# Patient Record
Sex: Male | Born: 1995
Health system: Southern US, Community
[De-identification: ages and names within clinical notes are randomized; demographics above are authoritative.]

## PROBLEM LIST (undated history)

## (undated) DIAGNOSIS — F329 Major depressive disorder, single episode, unspecified: Secondary | ICD-10-CM

## (undated) DIAGNOSIS — E079 Disorder of thyroid, unspecified: Secondary | ICD-10-CM

## (undated) DIAGNOSIS — R011 Cardiac murmur, unspecified: Secondary | ICD-10-CM

## (undated) DIAGNOSIS — F32A Depression, unspecified: Secondary | ICD-10-CM

---
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
## 2016-12-06 IMAGING — CT CT ABDOMEN W/ CM
2 of 4 series · 17 of 46 positions shown, 19 images · IV contrast ([ID] [id number])
Comparison: 05/19/2006

CLINICAL DATA: Mid abdominal pain since [REDACTED]. Elevated
lipase last week.

EXAM:
CT ABDOMEN WITH CONTRAST
TECHNIQUE: Multidetector CT imaging of the abdomen was performed using the
standard protocol following bolus administration of intravenous
contrast.
CONTRAST:  125mL 2TCL9X-211 IOPAMIDOL (2TCL9X-211) INJECTION 61%

[Series 3: abd/pelvis with · axial · 0.70mm/px · z∈[-258,-2]mm · 14 of 57 slices shown, 16 images]
[im 3/57  soft-tissue]
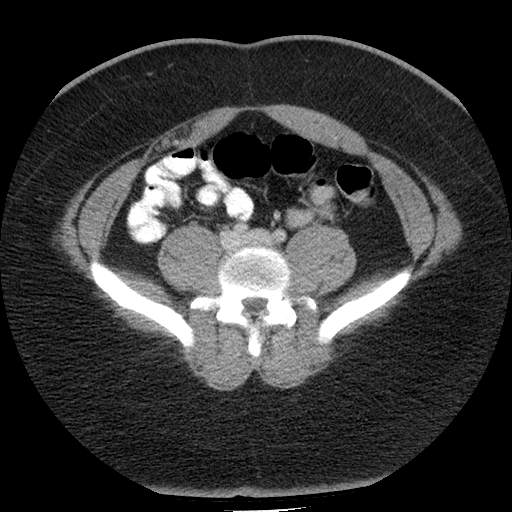
[im 3/57  bone]
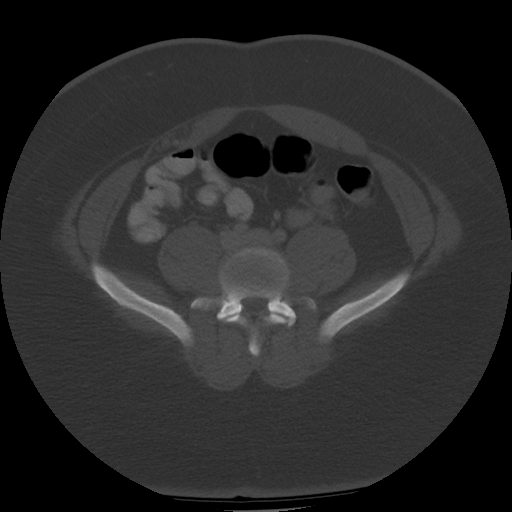
[im 8/57  soft-tissue]
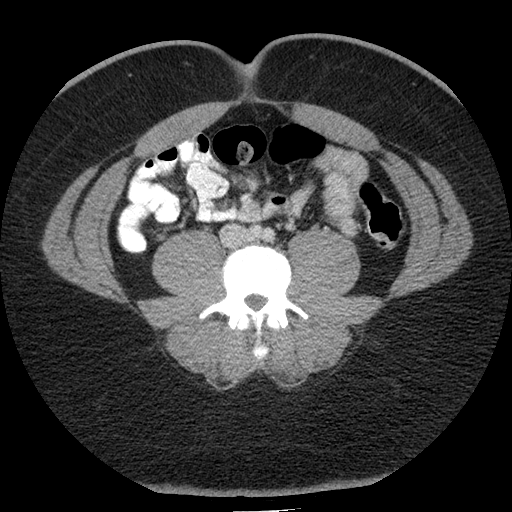
[im 11/57  soft-tissue]
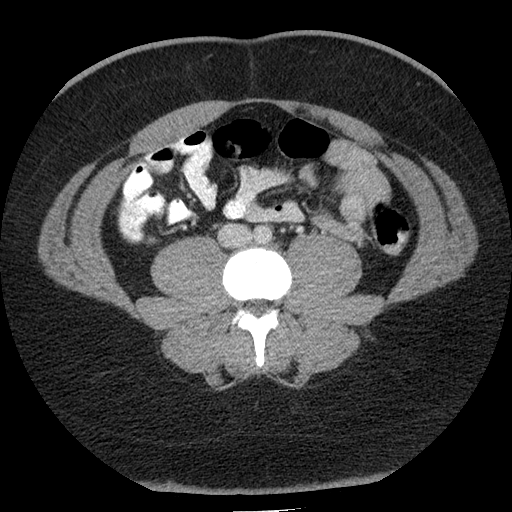
[im 16/57  soft-tissue]
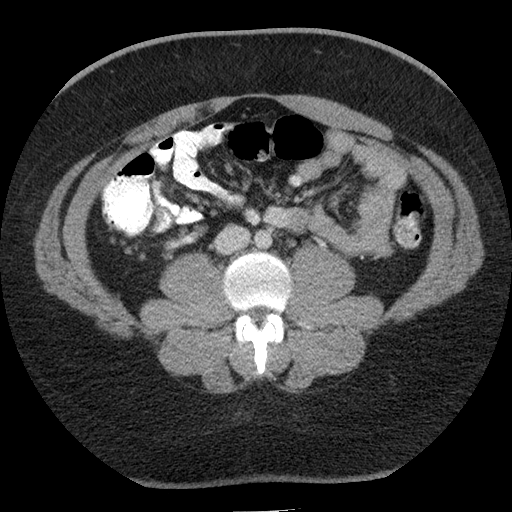
[im 18/57  soft-tissue]
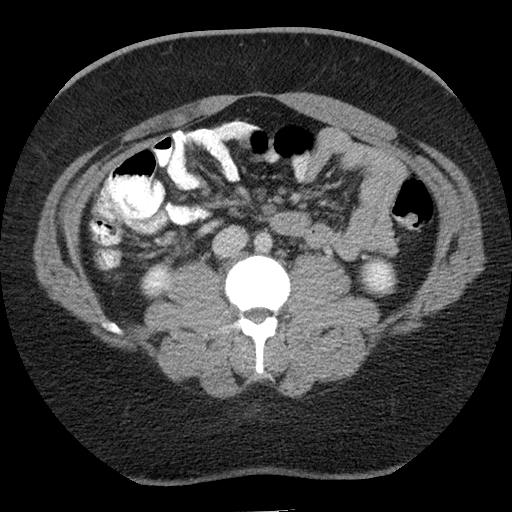
[im 23/57  soft-tissue]
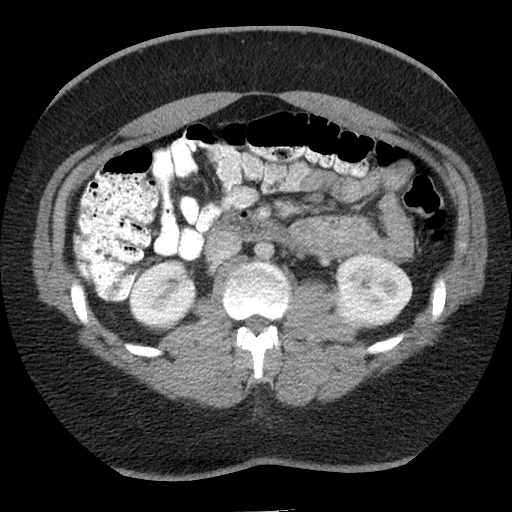
[im 26/57  soft-tissue]
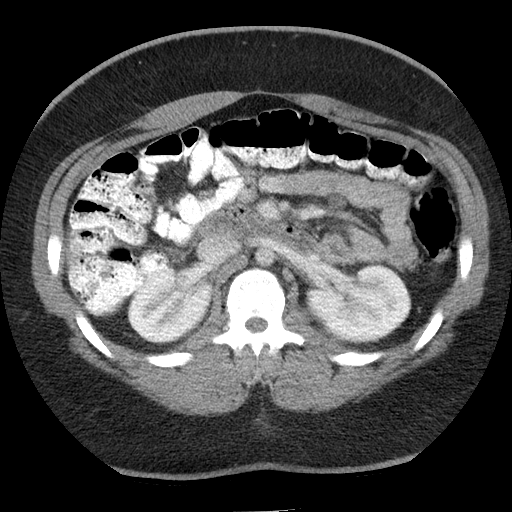
[im 31/57  soft-tissue]
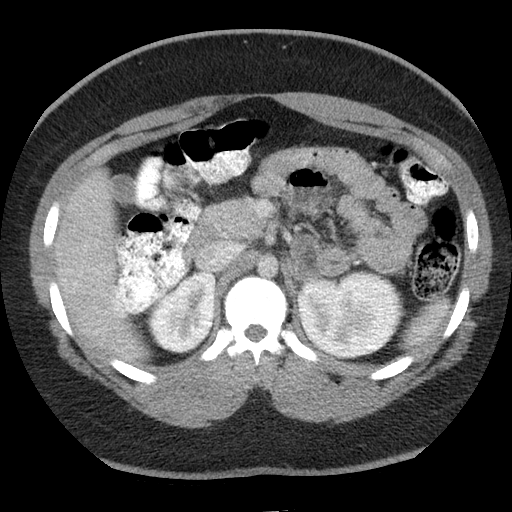
[im 34/57  soft-tissue]
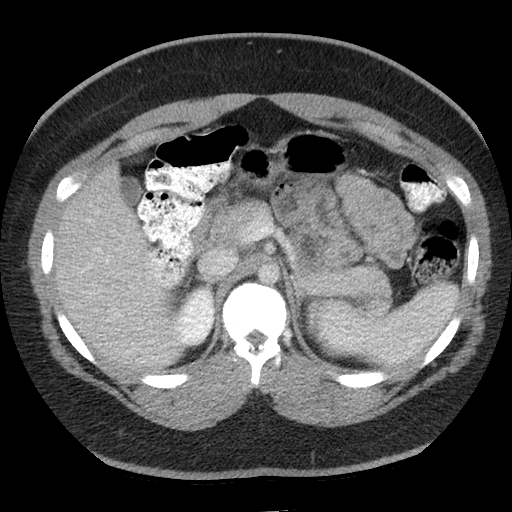
[im 34/57  bone]
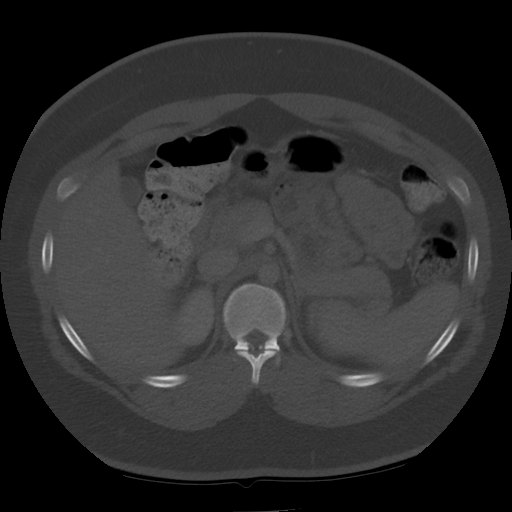
[im 39/57  soft-tissue]
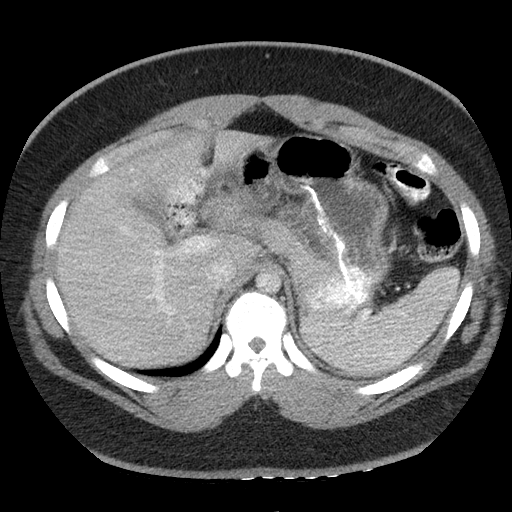
[im 41/57  soft-tissue]
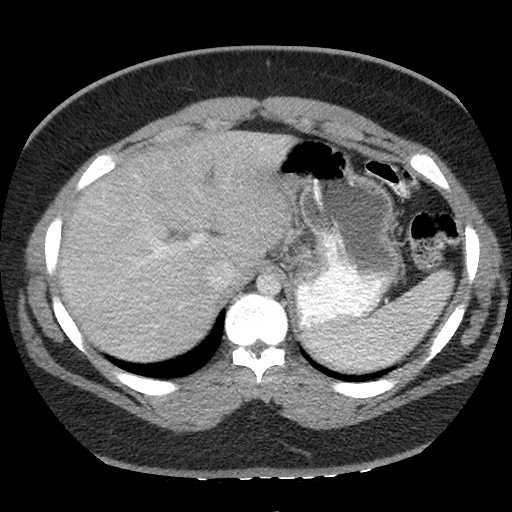
[im 46/57  soft-tissue]
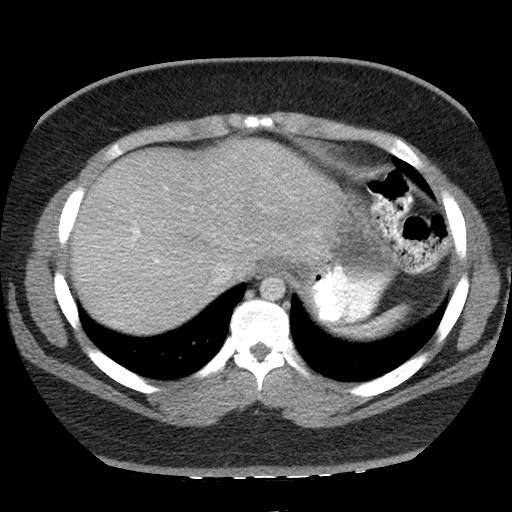
[im 49/57  soft-tissue]
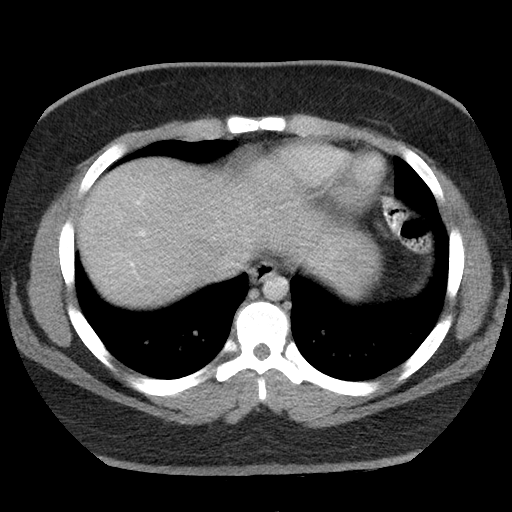
[im 54/57  soft-tissue]
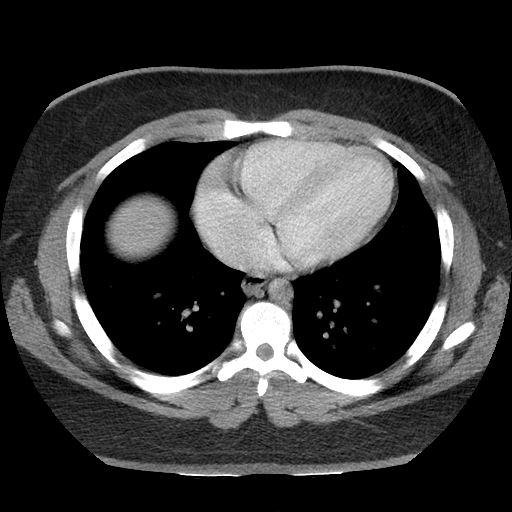

[Series 200: coronal · coronal · 0.70mm/px · 3 of 168 slices shown]
[im 56/168  soft-tissue]
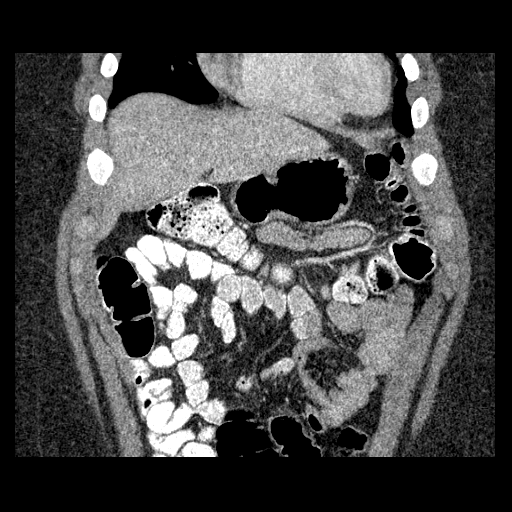
[im 75/168  soft-tissue]
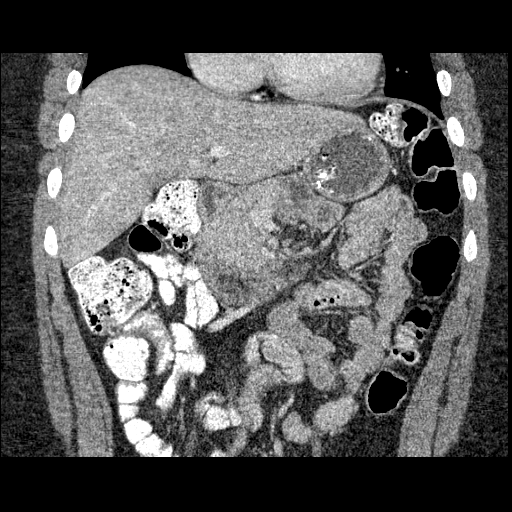
[im 93/168  soft-tissue]
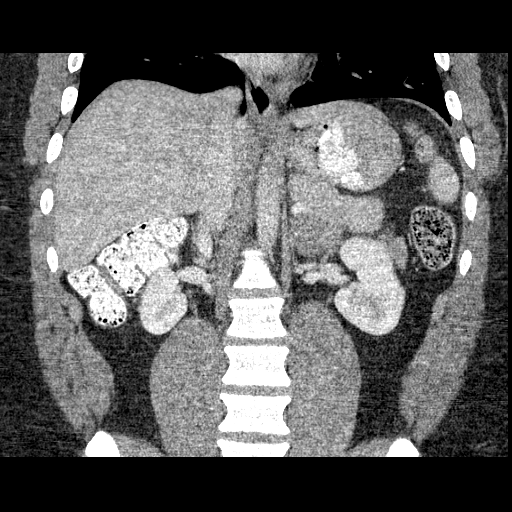

[17 of 46 positions shown; findings below may reference images not displayed]

FINDINGS: Lower chest: The lung bases are clear. No pleural effusion or
pulmonary nodules. The heart is normal in size. No pericardial
effusion. The distal esophagus is grossly normal.

Hepatobiliary: No focal hepatic lesions or intrahepatic biliary
dilatation. The gallbladder is normal. No common bile duct
dilatation.

Pancreas: No mass, inflammation or ductal dilatation. Examination is
somewhat limited by respiratory motion.

Spleen: Normal size.  No focal lesions.

Adrenals/Urinary Tract: The adrenal glands and kidneys are normal.

Stomach/Bowel: The stomach, duodenum, visualized small bowel and
visualized colon are normal. No inflammatory changes or mass
lesions. The appendix is normal.

Vascular/Lymphatic: There are numerous small scattered mesenteric
lymph nodes which are unchanged. No retroperitoneal adenopathy or
mass. The aorta and branch vessels are normal. The major venous
structures are patent.

Other: No ascites, abdominal wall hernia or subcutaneous lesions.

Musculoskeletal: No significant bony findings.
IMPRESSION: No acute abdominal findings, mass lesions are adenopathy.

Stable numerous scattered mesenteric lymph nodes since [DATE].

## 2019-07-16 DIAGNOSIS — F4310 Post-traumatic stress disorder, unspecified: Secondary | ICD-10-CM | POA: Diagnosis not present

## 2019-07-16 DIAGNOSIS — Z20822 Contact with and (suspected) exposure to covid-19: Secondary | ICD-10-CM | POA: Diagnosis not present

## 2019-07-16 DIAGNOSIS — F1210 Cannabis abuse, uncomplicated: Secondary | ICD-10-CM | POA: Diagnosis not present

## 2019-07-16 DIAGNOSIS — F312 Bipolar disorder, current episode manic severe with psychotic features: Secondary | ICD-10-CM | POA: Diagnosis not present

## 2019-07-16 DIAGNOSIS — F1020 Alcohol dependence, uncomplicated: Secondary | ICD-10-CM | POA: Diagnosis not present

## 2019-09-10 ENCOUNTER — Ambulatory Visit (HOSPITAL_COMMUNITY)
Admission: EM | Admit: 2019-09-10 | Discharge: 2019-09-10 | Disposition: A | Discharge status: Home / Self Care | Payer: Managed Care, Other (non HMO) | Attending: Family Medicine | Admitting: Family Medicine

## 2019-09-10 ENCOUNTER — Other Ambulatory Visit: Payer: Self-pay

## 2019-09-10 ENCOUNTER — Encounter (HOSPITAL_COMMUNITY): Payer: Self-pay

## 2019-09-10 DIAGNOSIS — F329 Major depressive disorder, single episode, unspecified: Secondary | ICD-10-CM

## 2019-09-10 DIAGNOSIS — F32A Depression, unspecified: Secondary | ICD-10-CM

## 2019-09-10 HISTORY — DX: Depression, unspecified: F32.A

## 2019-09-10 MED ORDER — FLUOXETINE HCL 10 MG PO TABS: 10.0000 mg | ORAL_TABLET | Freq: Every day | ORAL | 1 refills | Status: DC

## 2019-09-10 NOTE — ED Triage Notes (Signed)
Pt states he "got depressed out of now where yesterday." Pt states he is on abilify, but doesn't believe it's helping that much anymore. He believes the dosage needs to be raised or needs to be started on another med. Pt denies SI/HI at present, but states he does feel suicidal when he gets depressed. Pt states he feels fine today.

## 2019-09-11 NOTE — ED Provider Notes (Signed)
[place]   [id number] 09/10/19 Arrival Time: 7564  ASSESSMENT & PLAN:  1. Depression, unspecified depression type     Encouraged exercise and good sleep habits.  Begin: Meds ordered this encounter  Medications  . FLUoxetine (PROZAC) 10 MG tablet    Sig: Take 1 tablet (10 mg total) by mouth daily.    Dispense:  30 tablet    Refill:  1    Recommend: Follow-up Information    [place].   Specialty: Urgent Care Why: If worsening or failing to improve as anticipated. Contact information: [place] [phone]       Schedule an appointment as soon as possible for a visit  with [facility]   Why: Since you mentioned that you need your vision checked. Contact information: [address] [place] [address] [phone]           Reviewed expectations re: course of current medical issues. Questions answered. Outlined signs and symptoms indicating need for more acute intervention. Patient verbalized understanding. After Visit Summary given.   SUBJECTIVE:  NISHANT SCHRECENGOST is a 24 y.o. male who reports long h/o on/off depression. Was placed on Abilify at some point; continues taking. Yesterday feeling depressed. Feels like he may need to start a depression medicine. No SI/HI at this time. Has felt SI in the distant past. No anxiety or stress reported.  Also feels like he needs his vision checked.  Social History   Tobacco Use  Smoking Status Current Every Day Smoker  . Packs/day: 1.00  . Types: Cigarettes   Social History   Substance and Sexual Activity  Alcohol Use Yes   Comment: social   No illicit drug use reported.  ROS: As per HPI. All other systems negative.   OBJECTIVE:  Vitals:   09/10/19 1518  BP: 105/73  Pulse: 81  Resp: 18  Temp: 98.6 F (37 C)  TempSrc: Oral  SpO2: 98%  Weight: 127 kg  Height: 5\' 5"  (1.651 m)      General appearance: alert; appears in NAD Eyes: PERRLA; EOMI; conjunctiva normal HENT: normocephalic; atraumatic Neck: supple Extremities: no edema; symmetrical with no gross deformities Skin: warm and dry Neurologic: normal gait; normal symmetric reflexes Psychological: alert and cooperative; appropriate mood; normal affect   No Known Allergies  Past Medical History:  Diagnosis Date  . Depression   . Heart murmur    Social History   Socioeconomic History  . Marital status: Single    Spouse name: Not on file  . Number of children: Not on file  . Years of education: Not on file  . Highest education level: Not on file  Occupational History  . Not on file  Tobacco Use  . Smoking status: Current Every Day Smoker    Packs/day: 1.00    Types: Cigarettes  Substance and Sexual Activity  . Alcohol use: Yes    Comment: social  . Drug use: Not Currently    Types: Marijuana  . Sexual activity: Not on file  Other Topics Concern  . Not on file  Social History Narrative  . Not on file   Social Determinants of Health   Financial Resource Strain:   . Difficulty of Paying Living Expenses:   Food Insecurity:   . Worried About Charity fundraiser in the Last Year:   . Arboriculturist in the Last Year:   [employer]  Needs:   . Lack of Transportation (Medical):   Marland Kitchen Lack of Transportation (Non-Medical):   Physical Activity:   . Days of Exercise per Week:   . Minutes of Exercise per Session:   Stress:   . Feeling of Stress :   Social Connections:   . Frequency of Communication with Friends and Family:   . Frequency of Social Gatherings with Friends and Family:   . Attends Religious Services:   . Active Member of Clubs or Organizations:   . Attends Banker Meetings:   Marland Kitchen Marital Status:   Intimate Partner Violence:   . Fear of Current or Ex-Partner:   . Emotionally Abused:   Marland Kitchen Physically Abused:   . Sexually Abused:    Family History  Problem Relation  Age of Onset  . Crohn's disease Mother   . Asthma Father    Past Surgical History:  Procedure Laterality Date  . ESOPHAGOGASTRODUODENOSCOPY (EGD) WITH PROPOFOL N/A 05/12/2015   Procedure: ESOPHAGOGASTRODUODENOSCOPY (EGD) WITH PROPOFOL;  Surgeon: Willis Modena, MD;  Location: [facility];  Service: Endoscopy;  Laterality: N/A;  . EUS N/A 05/12/2015   Procedure: UPPER ENDOSCOPIC ULTRASOUND (EUS) RADIAL;  Surgeon: Willis Modena, MD;  Location: [facility];  Service: Endoscopy;  Laterality: N/A;      Mardella Layman, MD 09/11/19 1004

## 2020-06-02 DIAGNOSIS — F331 Major depressive disorder, recurrent, moderate: Secondary | ICD-10-CM | POA: Diagnosis not present

## 2020-06-07 ENCOUNTER — Other Ambulatory Visit: Payer: Self-pay

## 2020-06-07 ENCOUNTER — Ambulatory Visit (HOSPITAL_COMMUNITY)
Admission: EM | Admit: 2020-06-07 | Discharge: 2020-06-07 | Disposition: A | Discharge status: Home / Self Care | Payer: BC Managed Care – PPO | Attending: Family Medicine | Admitting: Family Medicine

## 2020-06-07 ENCOUNTER — Encounter (HOSPITAL_COMMUNITY): Payer: Self-pay | Admitting: Emergency Medicine

## 2020-06-07 DIAGNOSIS — G479 Sleep disorder, unspecified: Secondary | ICD-10-CM

## 2020-06-07 DIAGNOSIS — F3340 Major depressive disorder, recurrent, in remission, unspecified: Secondary | ICD-10-CM

## 2020-06-07 DIAGNOSIS — E039 Hypothyroidism, unspecified: Secondary | ICD-10-CM

## 2020-06-07 HISTORY — DX: Disorder of thyroid, unspecified: E07.9

## 2020-06-07 MED ORDER — MIRTAZAPINE 15 MG PO TABS: 15.0000 mg | ORAL_TABLET | Freq: Every day | ORAL | 0 refills | Status: DC

## 2020-06-07 MED ORDER — QUETIAPINE FUMARATE 25 MG PO TABS: 25.0000 mg | ORAL_TABLET | Freq: Every day | ORAL | 0 refills | Status: DC

## 2020-06-07 MED ORDER — QUETIAPINE FUMARATE ER 150 MG PO TB24: 150.0000 mg | ORAL_TABLET | Freq: Every day | ORAL | 0 refills | Status: DC

## 2020-06-07 MED ORDER — SERTRALINE HCL 100 MG PO TABS: 100.0000 mg | ORAL_TABLET | Freq: Two times a day (BID) | ORAL | 0 refills | Status: DC

## 2020-06-07 MED ORDER — LEVOTHYROXINE SODIUM 25 MCG PO TABS: 25.0000 ug | ORAL_TABLET | Freq: Every day | ORAL | 0 refills | Status: DC

## 2020-06-07 NOTE — ED Triage Notes (Signed)
Pt presents for medication refills. States recently moved back to [place]. States has appt to establish with PCP on 01/06.

## 2020-06-07 NOTE — ED Provider Notes (Signed)
[facility]    CSN: [id number] Arrival date & time: 06/07/20  1727      History   Chief Complaint No chief complaint on file.   HPI Dominic Bryant is a 24 y.o. male.   HPI   Patient is here requesting refill of medications.  He brings with him his most recent pharmacy records.  He also brings the discharge summary from a program in [place].  He was treated there for major depression, anxiety, alcohol abuse, cannabis abuse.  He states that he has done well in his program.  He is back in [place] with his family.  He is looking for a job.  He is doing well with his sobriety. He is trying to lose weight and exercise in order to improve his health His medications are reviewed with him.  He states that he is stable on these medications. He states that his major diagnosis is depression.  In addition he has difficulty sleeping.  He states the Seroquel and Remeron were added to help him sleep.  Past Medical History:  Diagnosis Date  . Depression   . Heart murmur   . Thyroid disease     There are no problems to display for this patient.   Past Surgical History:  Procedure Laterality Date  . ESOPHAGOGASTRODUODENOSCOPY (EGD) WITH PROPOFOL N/A 05/12/2015   Procedure: ESOPHAGOGASTRODUODENOSCOPY (EGD) WITH PROPOFOL;  Surgeon: Willis Modena, MD;  Location: [facility];  Service: Endoscopy;  Laterality: N/A;  . EUS N/A 05/12/2015   Procedure: UPPER ENDOSCOPIC ULTRASOUND (EUS) RADIAL;  Surgeon: Willis Modena, MD;  Location: [facility];  Service: Endoscopy;  Laterality: N/A;       Home Medications    Prior to Admission medications   Medication Sig Start Date End Date Taking? Authorizing Provider  levothyroxine (EUTHYROX) 25 MCG tablet Take 1 tablet (25 mcg total) by mouth daily before breakfast. 06/07/20   Eustace Moore, MD  mirtazapine (REMERON) 15 MG tablet Take 1 tablet (15 mg total) by mouth at bedtime. 06/07/20   Eustace Moore, MD  QUEtiapine  (SEROQUEL) 25 MG tablet Take 1 tablet (25 mg total) by mouth at bedtime. 06/07/20   Eustace Moore, MD  QUEtiapine Fumarate (SEROQUEL XR) 150 MG 24 hr tablet Take 1 tablet (150 mg total) by mouth at bedtime. 06/07/20   Eustace Moore, MD  sertraline (ZOLOFT) 100 MG tablet Take 1 tablet (100 mg total) by mouth 2 (two) times daily. 06/07/20   Eustace Moore, MD  ARIPiprazole (ABILIFY) 5 MG tablet Take 5 mg by mouth daily.  06/07/20  [provider]  FLUoxetine (PROZAC) 10 MG tablet Take 1 tablet (10 mg total) by mouth daily. 09/10/19 06/07/20  Mardella Layman, MD    Family History Family History  Problem Relation Age of Onset  . Crohn's disease Mother   . Asthma Father     Social History Social History   Tobacco Use  . Smoking status: Current Every Day Smoker    Packs/day: 1.00    Types: Cigarettes  . Smokeless tobacco: Never Used  Substance Use Topics  . Alcohol use: Yes    Comment: social  . Drug use: Not Currently    Types: Marijuana     Allergies   Patient has no known allergies.   Review of Systems Review of Systems  See HPI Physical Exam Triage Vital Signs ED Triage Vitals  Enc Vitals Group     BP 06/07/20 1842 138/80  Pulse Rate 06/07/20 1842 85     Resp 06/07/20 1842 17     Temp 06/07/20 1842 98.3 F (36.8 C)     Temp Source 06/07/20 1842 Oral     SpO2 06/07/20 1842 98 %     Weight --      Height --      Head Circumference --      Peak Flow --      Pain Score 06/07/20 1841 0     Pain Loc --      Pain Edu? --      Excl. in GC? --    No data found.  Updated Vital Signs BP 138/80 (BP Location: Right Arm)   Pulse 85   Temp 98.3 F (36.8 C) (Oral)   Resp 17   SpO2 98%      Physical Exam Constitutional:      General: He is not in acute distress.    Appearance: He is well-developed and well-nourished.     Comments: Pleasant.  Obese.  Articulate  HENT:     Head: Normocephalic and atraumatic.     Nose:     Comments: Mask  is in place    Mouth/Throat:     Mouth: Oropharynx is clear and moist.  Eyes:     Conjunctiva/sclera: Conjunctivae normal.     Pupils: Pupils are equal, round, and reactive to light.  Cardiovascular:     Rate and Rhythm: Normal rate and regular rhythm.     Heart sounds: Normal heart sounds.  Pulmonary:     Effort: Pulmonary effort is normal. No respiratory distress.     Breath sounds: Normal breath sounds.  Abdominal:     Palpations: Abdomen is soft.  Musculoskeletal:        General: No edema. Normal range of motion.     Cervical back: Normal range of motion.  Skin:    General: Skin is warm and dry.  Neurological:     Mental Status: He is alert.  Psychiatric:        Behavior: Behavior normal.      UC Treatments / Results  Labs (all labs ordered are listed, but only abnormal results are displayed) Labs Reviewed - No data to display  EKG   Radiology No results found.  Procedures Procedures (including critical care time)  Medications Ordered in UC Medications - No data to display  Initial Impression / Assessment and Plan / UC Course  I have reviewed the triage vital signs and the nursing notes.  Pertinent labs & imaging results that were available during my care of the patient were reviewed by me and considered in my medical decision making (see chart for details).     Limited record review Pharmacy review Medications are refilled for 30 days Appointment in January is confirmed Return here as needed Discussed the behavioral health urgent care in case he has mental health needs Final Clinical Impressions(s) / UC Diagnoses   Final diagnoses:  Recurrent major depressive disorder, in remission (HCC)  Hypothyroidism, unspecified type  Sleep disorder, unspecified     Discharge Instructions     All medicines refilled for 30 days See new primary care as scheduled    ED Prescriptions    Medication Sig Dispense Auth. Provider   mirtazapine (REMERON) 15 MG  tablet Take 1 tablet (15 mg total) by mouth at bedtime. 30 tablet Eustace Moore, MD   levothyroxine (EUTHYROX) 25 MCG tablet Take 1 tablet (25 mcg total) by  mouth daily before breakfast. 30 tablet Eustace Moore, MD   QUEtiapine (SEROQUEL) 25 MG tablet Take 1 tablet (25 mg total) by mouth at bedtime. 30 tablet Eustace Moore, MD   QUEtiapine Fumarate (SEROQUEL XR) 150 MG 24 hr tablet Take 1 tablet (150 mg total) by mouth at bedtime. 30 tablet Eustace Moore, MD   sertraline (ZOLOFT) 100 MG tablet Take 1 tablet (100 mg total) by mouth 2 (two) times daily. 60 tablet Eustace Moore, MD     PDMP not reviewed this encounter.   Eustace Moore, MD 06/07/20 [phone]

## 2020-06-07 NOTE — Discharge Instructions (Addendum)
All medicines refilled for 30 days See new primary care as scheduled

## 2020-07-01 ENCOUNTER — Ambulatory Visit (INDEPENDENT_AMBULATORY_CARE_PROVIDER_SITE_OTHER): Payer: BC Managed Care – PPO | Admitting: Internal Medicine

## 2020-07-01 ENCOUNTER — Other Ambulatory Visit: Payer: Self-pay

## 2020-07-01 ENCOUNTER — Encounter: Payer: Self-pay | Admitting: Internal Medicine

## 2020-07-01 VITALS — BP 124/82 | HR 79 | Temp 98.0°F | Ht 65.0 in | Wt 285.0 lb

## 2020-07-01 DIAGNOSIS — E039 Hypothyroidism, unspecified: Secondary | ICD-10-CM

## 2020-07-01 DIAGNOSIS — Z0001 Encounter for general adult medical examination with abnormal findings: Secondary | ICD-10-CM | POA: Diagnosis not present

## 2020-07-01 DIAGNOSIS — F324 Major depressive disorder, single episode, in partial remission: Secondary | ICD-10-CM | POA: Insufficient documentation

## 2020-07-01 DIAGNOSIS — K529 Noninfective gastroenteritis and colitis, unspecified: Secondary | ICD-10-CM | POA: Insufficient documentation

## 2020-07-01 LAB — CBC WITH DIFFERENTIAL/PLATELET
Basophils Absolute: 0.1 10*3/uL (ref 0.0–0.1)
Basophils Relative: 0.6 % (ref 0.0–3.0)
Eosinophils Absolute: 0.5 10*3/uL (ref 0.0–0.7)
Eosinophils Relative: 6.5 % — ABNORMAL HIGH (ref 0.0–5.0)
HCT: 44.8 % (ref 39.0–52.0)
Hemoglobin: 14.8 g/dL (ref 13.0–17.0)
Lymphocytes Relative: 26.2 % (ref 12.0–46.0)
Lymphs Abs: 2.2 10*3/uL (ref 0.7–4.0)
MCHC: 33 g/dL (ref 30.0–36.0)
MCV: 87.8 fl (ref 78.0–100.0)
Monocytes Absolute: 0.7 10*3/uL (ref 0.1–1.0)
Monocytes Relative: 8.7 % (ref 3.0–12.0)
Neutro Abs: 4.8 10*3/uL (ref 1.4–7.7)
Neutrophils Relative %: 58 % (ref 43.0–77.0)
Platelets: 201 10*3/uL (ref 150.0–400.0)
RBC: 5.1 Mil/uL (ref 4.22–5.81)
RDW: 14.2 % (ref 11.5–15.5)
WBC: 8.3 10*3/uL (ref 4.0–10.5)

## 2020-07-01 LAB — BASIC METABOLIC PANEL
BUN: 11 mg/dL (ref 6–23)
CO2: 26 mEq/L (ref 19–32)
Calcium: 9.2 mg/dL (ref 8.4–10.5)
Chloride: 107 mEq/L (ref 96–112)
Creatinine, Ser: 1 mg/dL (ref 0.40–1.50)
GFR: 106.19 mL/min (ref >60.00)
Glucose, Bld: 76 mg/dL (ref 70–99)
Potassium: 4 mEq/L (ref 3.5–5.1)
Sodium: 138 mEq/L (ref 135–145)

## 2020-07-01 LAB — HEPATIC FUNCTION PANEL
ALT: 31 U/L (ref 0–53)
AST: 22 U/L (ref 0–37)
Albumin: 4.2 g/dL (ref 3.5–5.2)
Alkaline Phosphatase: 50 U/L (ref 39–117)
Bilirubin, Direct: 0.1 mg/dL (ref 0.0–0.3)
Total Bilirubin: 0.4 mg/dL (ref 0.2–1.2)
Total Protein: 7.3 g/dL (ref 6.0–8.3)

## 2020-07-01 LAB — LIPID PANEL
Cholesterol: 189 mg/dL (ref 0–200)
HDL: 40.5 mg/dL (ref >39.00)
LDL Cholesterol: 131 mg/dL — ABNORMAL HIGH (ref 0–99)
NonHDL: 148.91
Total CHOL/HDL Ratio: 5
Triglycerides: 91 mg/dL (ref 0.0–149.0)
VLDL: 18.2 mg/dL (ref 0.0–40.0)

## 2020-07-01 LAB — HEMOGLOBIN A1C: Hgb A1c MFr Bld: 4.6 % (ref 4.6–6.5)

## 2020-07-01 LAB — TSH: TSH: 4.69 u[IU]/mL — ABNORMAL HIGH (ref 0.35–4.50)

## 2020-07-01 MED ORDER — LEVOTHYROXINE SODIUM 50 MCG PO TABS: 50.0000 ug | ORAL_TABLET | Freq: Every day | ORAL | 0 refills | Status: DC

## 2020-07-01 NOTE — Patient Instructions (Signed)

## 2020-07-01 NOTE — Progress Notes (Signed)
Subjective:  Patient ID: Dominic Bryant, male    DOB: 08/01/1995  Age: 25 y.o. MRN: [id number]  CC: Annual Exam  This visit occurred during the SARS-CoV-2 public health emergency.  Safety protocols were in place, including screening questions prior to the visit, additional usage of staff PPE, and extensive cleaning of exam room while observing appropriate contact time as indicated for disinfecting solutions.    HPI Dominic Bryant presents for a CPX.  He tells me that he was discharged from a psychiatric residential treatment program in [place] about 6 weeks ago.  Prior to the admission he had had a suicide attempt.  While he was in treatment he was diagnosed with hypothyroidism.  He complains of weight gain.  He also complains of chronic diarrhea.  He tells me that he has about 2 or 3 watery bowel movements a day.  He does not know what causes the diarrhea.  He is not aware of any sensitivity to gluten or dairy products.  He denies abdominal pain, nausea, vomiting, bloody stool, or melena.  He tells me the diarrhea has not previously been investigated.  He tells me he is improving on the combination of mirtazapine, quetiapine, and sertraline.  He does not drink alcohol but smokes cigarettes and uses marijuana.  History Jagger has a past medical history of Depression, Heart murmur, and Thyroid disease.   He has a past surgical history that includes Esophagogastroduodenoscopy (egd) with propofol (N/A, 05/12/2015) and EUS (N/A, 05/12/2015).   His family history includes Asthma in his father; Crohn's disease in his mother.He reports that he has been smoking cigarettes. He has been smoking about 1.00 pack per day. He has never used smokeless tobacco. He reports current alcohol use. He reports previous drug use. Drug: Marijuana.  Outpatient Medications Prior to Visit  Medication Sig Dispense Refill   mirtazapine (REMERON) 15 MG tablet Take 1 tablet (15 mg total) by mouth at bedtime. 30 tablet 0    QUEtiapine (SEROQUEL) 25 MG tablet Take 1 tablet (25 mg total) by mouth at bedtime. 30 tablet 0   QUEtiapine Fumarate (SEROQUEL XR) 150 MG 24 hr tablet Take 1 tablet (150 mg total) by mouth at bedtime. 30 tablet 0   sertraline (ZOLOFT) 100 MG tablet Take 1 tablet (100 mg total) by mouth 2 (two) times daily. 60 tablet 0   levothyroxine (EUTHYROX) 25 MCG tablet Take 1 tablet (25 mcg total) by mouth daily before breakfast. 30 tablet 0   No facility-administered medications prior to visit.    ROS Review of Systems  Constitutional: Positive for unexpected weight change. Negative for chills, diaphoresis, fatigue and fever.  HENT: Negative.   Eyes: Negative for visual disturbance.  Respiratory: Negative for cough, chest tightness, shortness of breath and wheezing.   Cardiovascular: Negative for chest pain, palpitations and leg swelling.  Gastrointestinal: Positive for diarrhea. Negative for abdominal pain, blood in stool, nausea and vomiting.  Endocrine: Negative for cold intolerance and heat intolerance.  Genitourinary: Negative.  Negative for difficulty urinating, dysuria, scrotal swelling and testicular pain.  Musculoskeletal: Negative for arthralgias and myalgias.  Skin: Negative.  Negative for color change.  Neurological: Negative.  Negative for dizziness, weakness, light-headedness and numbness.  Hematological: Negative for adenopathy. Does not bruise/bleed easily.  Psychiatric/Behavioral: Negative.     Objective:  BP 124/82    Pulse 79    Temp 98 F (36.7 C) (Oral)    Ht 5\' 5"  (1.651 m)    Wt 285 lb (129.3 kg)  SpO2 97%    BMI 47.43 kg/m   Physical Exam Vitals reviewed.  Constitutional:      Appearance: He is obese.  HENT:     Nose: Nose normal.     Mouth/Throat:     Mouth: Mucous membranes are moist.  Eyes:     General: No scleral icterus.    Conjunctiva/sclera: Conjunctivae normal.  Cardiovascular:     Rate and Rhythm: Normal rate and regular rhythm.     Heart  sounds: No murmur heard.   Pulmonary:     Effort: Pulmonary effort is normal.     Breath sounds: No stridor. No wheezing, rhonchi or rales.  Abdominal:     General: Abdomen is protuberant. Bowel sounds are normal. There is no distension.     Palpations: Abdomen is soft. There is no hepatomegaly, splenomegaly or mass.     Tenderness: There is no abdominal tenderness.  Musculoskeletal:        General: Normal range of motion.     Cervical back: Neck supple.     Right lower leg: No edema.     Left lower leg: No edema.  Lymphadenopathy:     Cervical: No cervical adenopathy.  Skin:    General: Skin is warm and dry.     Coloration: Skin is not pale.     Findings: No rash.  Neurological:     General: No focal deficit present.     Mental Status: He is alert.  Psychiatric:        Mood and Affect: Mood normal.        Behavior: Behavior normal.     Lab Results  Component Value Date   WBC 8.3 07/01/2020   HGB 14.8 07/01/2020   HCT 44.8 07/01/2020   PLT 201.0 07/01/2020   GLUCOSE 76 07/01/2020   CHOL 189 07/01/2020   TRIG 91.0 07/01/2020   HDL 40.50 07/01/2020   LDLCALC 131 (H) 07/01/2020   ALT 31 07/01/2020   AST 22 07/01/2020   NA 138 07/01/2020   K 4.0 07/01/2020   CL 107 07/01/2020   CREATININE 1.00 07/01/2020   BUN 11 07/01/2020   CO2 26 07/01/2020   TSH 4.69 (H) 07/01/2020   HGBA1C 4.6 07/01/2020    Assessment & Plan:   [place] was seen today for annual exam.  Diagnoses and all orders for this visit:  Encounter for general adult medical examination with abnormal findings- Exam completed, labs reviewed, he was not willing to get a flu vaccine or tetanus booster, patient education was given. -     Lipid panel; Future -     Hepatitis C antibody; Future -     HIV Antibody (routine testing w rflx); Future -     HIV Antibody (routine testing w rflx) -     Hepatitis C antibody -     Lipid panel  Morbid obesity due to excess calories (HCC)- Labs are negative for  secondary causes or complications. -     CBC with Differential/Platelet; Future -     Basic metabolic panel; Future -     TSH; Future -     Hepatic function panel; Future -     Hemoglobin A1c; Future -     Hemoglobin A1c -     Hepatic function panel -     TSH -     Basic metabolic panel -     CBC with Differential/Platelet  Major depressive disorder with single episode, in partial remission (HCC)- Will  continue the current regimen. -     TSH; Future -     TSH  Acquired hypothyroidism- His TSH remains elevated and he is symptomatic.  I recommended that he increase the dose of levothyroxine. -     TSH; Future -     TSH -     levothyroxine (SYNTHROID) 50 MCG tablet; Take 1 tablet (50 mcg total) by mouth daily before breakfast.  Chronic diarrhea- Will screen for celiac disease. -     Gliadin antibodies, serum; Future -     Tissue transglutaminase, IgA; Future -     Reticulin Antibody, IgA w reflex titer; Future   I have discontinued [place] S. Rosander's levothyroxine. I am also having him start on levothyroxine. Additionally, I am having him maintain his mirtazapine, QUEtiapine, QUEtiapine Fumarate, and sertraline.  Meds ordered this encounter  Medications   levothyroxine (SYNTHROID) 50 MCG tablet    Sig: Take 1 tablet (50 mcg total) by mouth daily before breakfast.    Dispense:  90 tablet    Refill:  0     Follow-up: Return in about 6 months (around 12/29/2020).  Scarlette Calico, MD

## 2020-07-02 LAB — HIV ANTIBODY (ROUTINE TESTING W REFLEX): HIV 1&2 Ab, 4th Generation: NONREACTIVE

## 2020-07-02 LAB — HEPATITIS C ANTIBODY
Hepatitis C Ab: NONREACTIVE
SIGNAL TO CUT-OFF: 0.01 (ref <1.00)

## 2020-07-04 ENCOUNTER — Encounter: Payer: Self-pay | Admitting: Internal Medicine

## 2020-07-05 ENCOUNTER — Encounter: Payer: Self-pay | Admitting: Internal Medicine

## 2020-08-10 ENCOUNTER — Telehealth: Payer: Self-pay | Admitting: Internal Medicine

## 2020-08-10 NOTE — Telephone Encounter (Signed)
1.Medication Requested: mirtazapine (REMERON) 15 MG tablet  QUEtiapine (SEROQUEL) 25 MG tablet  QUEtiapine Fumarate (SEROQUEL XR) 150 MG 24 hr tablet  sertraline (ZOLOFT) 100 MG tablet    2. Pharmacy (Name, Street, [place]): [id number] - [place], [place] - [address]  3. On Med List: yes   4. Last Visit with PCP: 1.6.22  5. Next visit date with PCP: n/a    Agent: Please be advised that RX refills may take up to 3 business days. We ask that you follow-up with your pharmacy.

## 2020-08-11 MED ORDER — QUETIAPINE FUMARATE ER 150 MG PO TB24: 150.0000 mg | ORAL_TABLET | Freq: Every day | ORAL | 5 refills | Status: DC

## 2020-08-11 MED ORDER — MIRTAZAPINE 15 MG PO TABS: 15.0000 mg | ORAL_TABLET | Freq: Every day | ORAL | 5 refills | Status: DC

## 2020-08-11 MED ORDER — SERTRALINE HCL 100 MG PO TABS: 100.0000 mg | ORAL_TABLET | Freq: Two times a day (BID) | ORAL | 5 refills | Status: DC

## 2020-08-11 MED ORDER — QUETIAPINE FUMARATE 25 MG PO TABS: 25.0000 mg | ORAL_TABLET | Freq: Every day | ORAL | 5 refills | Status: DC

## 2020-08-11 NOTE — Telephone Encounter (Addendum)
Okay to renew for 6 months.  Schedule return office visit with Dr. Yetta Barre.  Thanks

## 2020-08-11 NOTE — Addendum Note (Signed)
Addended by: Darryll Capers on: 08/11/2020 08:16 AM   Modules accepted: Orders

## 2020-09-27 ENCOUNTER — Other Ambulatory Visit: Payer: Self-pay

## 2020-09-27 ENCOUNTER — Ambulatory Visit (HOSPITAL_COMMUNITY)
Admission: EM | Admit: 2020-09-27 | Discharge: 2020-09-28 | Disposition: A | Discharge status: Other Acute Inpatient Hospital | Payer: BC Managed Care – PPO | Attending: Student | Admitting: Student

## 2020-09-27 DIAGNOSIS — Z20822 Contact with and (suspected) exposure to covid-19: Secondary | ICD-10-CM | POA: Insufficient documentation

## 2020-09-27 DIAGNOSIS — Z9151 Personal history of suicidal behavior: Secondary | ICD-10-CM | POA: Insufficient documentation

## 2020-09-27 DIAGNOSIS — F1290 Cannabis use, unspecified, uncomplicated: Secondary | ICD-10-CM | POA: Insufficient documentation

## 2020-09-27 DIAGNOSIS — R45851 Suicidal ideations: Secondary | ICD-10-CM

## 2020-09-27 DIAGNOSIS — F333 Major depressive disorder, recurrent, severe with psychotic symptoms: Secondary | ICD-10-CM | POA: Insufficient documentation

## 2020-09-27 DIAGNOSIS — Z560 Unemployment, unspecified: Secondary | ICD-10-CM | POA: Insufficient documentation

## 2020-09-27 DIAGNOSIS — F17210 Nicotine dependence, cigarettes, uncomplicated: Secondary | ICD-10-CM | POA: Insufficient documentation

## 2020-09-27 DIAGNOSIS — Z79899 Other long term (current) drug therapy: Secondary | ICD-10-CM | POA: Insufficient documentation
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
## 2020-09-28 ENCOUNTER — Other Ambulatory Visit: Payer: Self-pay

## 2020-09-28 ENCOUNTER — Encounter (HOSPITAL_COMMUNITY): Payer: Self-pay | Admitting: Psychiatry

## 2020-09-28 ENCOUNTER — Inpatient Hospital Stay (HOSPITAL_COMMUNITY)
Admission: RE | Admit: 2020-09-28 | Discharge: 2020-10-04 | DRG: 885 | Disposition: A | Discharge status: Home / Self Care | Payer: BC Managed Care – PPO | Source: Intra-hospital | Attending: Psychiatry | Admitting: Psychiatry

## 2020-09-28 DIAGNOSIS — F332 Major depressive disorder, recurrent severe without psychotic features: Secondary | ICD-10-CM | POA: Diagnosis present

## 2020-09-28 DIAGNOSIS — Z62810 Personal history of physical and sexual abuse in childhood: Secondary | ICD-10-CM | POA: Diagnosis not present

## 2020-09-28 DIAGNOSIS — E039 Hypothyroidism, unspecified: Secondary | ICD-10-CM | POA: Diagnosis not present

## 2020-09-28 DIAGNOSIS — F419 Anxiety disorder, unspecified: Secondary | ICD-10-CM | POA: Diagnosis present

## 2020-09-28 DIAGNOSIS — F4310 Post-traumatic stress disorder, unspecified: Secondary | ICD-10-CM | POA: Diagnosis present

## 2020-09-28 DIAGNOSIS — F17210 Nicotine dependence, cigarettes, uncomplicated: Secondary | ICD-10-CM | POA: Diagnosis not present

## 2020-09-28 DIAGNOSIS — Z20822 Contact with and (suspected) exposure to covid-19: Secondary | ICD-10-CM | POA: Diagnosis present

## 2020-09-28 DIAGNOSIS — G4700 Insomnia, unspecified: Secondary | ICD-10-CM | POA: Diagnosis present

## 2020-09-28 DIAGNOSIS — F333 Major depressive disorder, recurrent, severe with psychotic symptoms: Principal | ICD-10-CM | POA: Diagnosis present

## 2020-09-28 DIAGNOSIS — R45851 Suicidal ideations: Secondary | ICD-10-CM | POA: Diagnosis not present

## 2020-09-28 DIAGNOSIS — Z9151 Personal history of suicidal behavior: Secondary | ICD-10-CM | POA: Diagnosis not present

## 2020-09-28 DIAGNOSIS — Z79899 Other long term (current) drug therapy: Secondary | ICD-10-CM | POA: Diagnosis not present

## 2020-09-28 DIAGNOSIS — Z560 Unemployment, unspecified: Secondary | ICD-10-CM | POA: Diagnosis not present

## 2020-09-28 DIAGNOSIS — F1290 Cannabis use, unspecified, uncomplicated: Secondary | ICD-10-CM | POA: Diagnosis not present

## 2020-09-28 LAB — POCT URINE DRUG SCREEN - MANUAL ENTRY (I-SCREEN)
POC Amphetamine UR: NOT DETECTED
POC Buprenorphine (BUP): NOT DETECTED
POC Cocaine UR: NOT DETECTED
POC Marijuana UR: POSITIVE — AB
POC Methadone UR: NOT DETECTED
POC Methamphetamine UR: NOT DETECTED
POC Morphine: NOT DETECTED
POC Oxazepam (BZO): NOT DETECTED
POC Oxycodone UR: NOT DETECTED
POC Secobarbital (BAR): NOT DETECTED

## 2020-09-28 LAB — CBC WITH DIFFERENTIAL/PLATELET
Abs Immature Granulocytes: 0.06 10*3/uL (ref 0.00–0.07)
Basophils Absolute: 0.1 10*3/uL (ref 0.0–0.1)
Basophils Relative: 1 %
Eosinophils Absolute: 0.9 10*3/uL — ABNORMAL HIGH (ref 0.0–0.5)
Eosinophils Relative: 8 %
HCT: 52.6 % — ABNORMAL HIGH (ref 39.0–52.0)
Hemoglobin: 17 g/dL (ref 13.0–17.0)
Immature Granulocytes: 1 %
Lymphocytes Relative: 29 %
Lymphs Abs: 3.4 10*3/uL (ref 0.7–4.0)
MCH: 29.3 pg (ref 26.0–34.0)
MCHC: 32.3 g/dL (ref 30.0–36.0)
MCV: 90.5 fL (ref 80.0–100.0)
Monocytes Absolute: 0.7 10*3/uL (ref 0.1–1.0)
Monocytes Relative: 6 %
Neutro Abs: 6.5 10*3/uL (ref 1.7–7.7)
Neutrophils Relative %: 55 %
Platelets: 189 10*3/uL (ref 150–400)
RBC: 5.81 MIL/uL (ref 4.22–5.81)
RDW: 13.1 % (ref 11.5–15.5)
WBC: 11.7 10*3/uL — ABNORMAL HIGH (ref 4.0–10.5)
nRBC: 0 % (ref 0.0–0.2)

## 2020-09-28 LAB — COMPREHENSIVE METABOLIC PANEL
ALT: 26 U/L (ref 0–44)
AST: 31 U/L (ref 15–41)
Albumin: 3.7 g/dL (ref 3.5–5.0)
Alkaline Phosphatase: 40 U/L (ref 38–126)
Anion gap: 10 (ref 5–15)
BUN: 6 mg/dL (ref 6–20)
CO2: 24 mmol/L (ref 22–32)
Calcium: 8.8 mg/dL — ABNORMAL LOW (ref 8.9–10.3)
Chloride: 105 mmol/L (ref 98–111)
Creatinine, Ser: 1.05 mg/dL (ref 0.61–1.24)
GFR, Estimated: >60 mL/min (ref >60)
Glucose, Bld: 65 mg/dL — ABNORMAL LOW (ref 70–99)
Potassium: 3.7 mmol/L (ref 3.5–5.1)
Sodium: 139 mmol/L (ref 135–145)
Total Bilirubin: 0.8 mg/dL (ref 0.3–1.2)
Total Protein: 6.1 g/dL — ABNORMAL LOW (ref 6.5–8.1)

## 2020-09-28 LAB — RESP PANEL BY RT-PCR (FLU A&B, COVID) ARPGX2
Influenza A by PCR: NEGATIVE
Influenza B by PCR: NEGATIVE
SARS Coronavirus 2 by RT PCR: NEGATIVE

## 2020-09-28 LAB — TSH: TSH: 5.221 u[IU]/mL — ABNORMAL HIGH (ref 0.350–4.500)

## 2020-09-28 MED ORDER — QUETIAPINE FUMARATE 50 MG PO TABS: 50.0000 mg | ORAL_TABLET | Freq: Every day | ORAL | Status: DC

## 2020-09-28 MED ORDER — SERTRALINE HCL 100 MG PO TABS
100.0000 mg | ORAL_TABLET | Freq: Two times a day (BID) | ORAL | Status: DC
  Administered 2020-09-28: 100 mg via ORAL
  Filled 2020-09-28: qty 1

## 2020-09-28 MED ORDER — MAGNESIUM HYDROXIDE 400 MG/5ML PO SUSP: 30.0000 mL | Freq: Every day | ORAL | Status: DC | PRN

## 2020-09-28 MED ORDER — THIAMINE HCL 100 MG PO TABS
100.0000 mg | ORAL_TABLET | Freq: Every day | ORAL | Status: DC
  Administered 2020-09-28 – 2020-10-04 (×7): 100 mg via ORAL
  Filled 2020-09-28 (×9): qty 1

## 2020-09-28 MED ORDER — MIRTAZAPINE 15 MG PO TABS
15.0000 mg | ORAL_TABLET | Freq: Every day | ORAL | Status: DC
  Filled 2020-09-28: qty 1

## 2020-09-28 MED ORDER — ACETAMINOPHEN 325 MG PO TABS: 650.0000 mg | ORAL_TABLET | Freq: Four times a day (QID) | ORAL | Status: DC | PRN

## 2020-09-28 MED ORDER — ALUM & MAG HYDROXIDE-SIMETH 200-200-20 MG/5ML PO SUSP: 30.0000 mL | ORAL | Status: DC | PRN

## 2020-09-28 MED ORDER — MIRTAZAPINE 15 MG PO TABS
15.0000 mg | ORAL_TABLET | Freq: Every day | ORAL | Status: DC
  Administered 2020-09-28 – 2020-10-03 (×6): 15 mg via ORAL
  Filled 2020-09-28 (×10): qty 1

## 2020-09-28 MED ORDER — MIRTAZAPINE 15 MG PO TABS: 15.0000 mg | ORAL_TABLET | Freq: Every day | ORAL | Status: DC

## 2020-09-28 MED ORDER — SERTRALINE HCL 50 MG PO TABS: 50.0000 mg | ORAL_TABLET | Freq: Every day | ORAL | Status: DC

## 2020-09-28 MED ORDER — HYDROXYZINE HCL 25 MG PO TABS: 25.0000 mg | ORAL_TABLET | Freq: Three times a day (TID) | ORAL | Status: DC | PRN

## 2020-09-28 MED ORDER — FOLIC ACID 1 MG PO TABS
1.0000 mg | ORAL_TABLET | Freq: Every day | ORAL | Status: DC
  Administered 2020-09-28 – 2020-10-04 (×7): 1 mg via ORAL
  Filled 2020-09-28 (×9): qty 1

## 2020-09-28 MED ORDER — HYDROXYZINE HCL 25 MG PO TABS
25.0000 mg | ORAL_TABLET | Freq: Three times a day (TID) | ORAL | Status: DC | PRN
  Administered 2020-09-30 – 2020-10-04 (×4): 25 mg via ORAL
  Filled 2020-09-28 (×4): qty 1

## 2020-09-28 MED ORDER — LEVOTHYROXINE SODIUM 25 MCG PO TABS: 50.0000 ug | ORAL_TABLET | Freq: Every day | ORAL | Status: DC

## 2020-09-28 MED ORDER — LEVOTHYROXINE SODIUM 50 MCG PO TABS
50.0000 ug | ORAL_TABLET | Freq: Every day | ORAL | Status: DC
  Filled 2020-09-28 (×2): qty 1

## 2020-09-28 MED ORDER — LEVOTHYROXINE SODIUM 25 MCG PO TABS
50.0000 ug | ORAL_TABLET | Freq: Every day | ORAL | Status: DC
  Administered 2020-09-28: 50 ug via ORAL
  Filled 2020-09-28: qty 2

## 2020-09-28 MED ORDER — QUETIAPINE FUMARATE ER 150 MG PO TB24: 150.0000 mg | ORAL_TABLET | Freq: Every day | ORAL | Status: DC

## 2020-09-28 MED ORDER — QUETIAPINE FUMARATE ER 50 MG PO TB24
150.0000 mg | ORAL_TABLET | Freq: Every day | ORAL | Status: DC
  Filled 2020-09-28: qty 3

## 2020-09-28 MED ORDER — TRAZODONE HCL 50 MG PO TABS
50.0000 mg | ORAL_TABLET | Freq: Every evening | ORAL | Status: DC | PRN
  Administered 2020-10-01: 50 mg via ORAL
  Filled 2020-09-28 (×2): qty 1

## 2020-09-28 MED ORDER — LEVOTHYROXINE SODIUM 50 MCG PO TABS: 50.0000 ug | ORAL_TABLET | Freq: Every day | ORAL | Status: DC

## 2020-09-28 MED ORDER — SERTRALINE HCL 100 MG PO TABS: 100.0000 mg | ORAL_TABLET | Freq: Two times a day (BID) | ORAL | Status: DC

## 2020-09-28 MED ORDER — QUETIAPINE FUMARATE ER 50 MG PO TB24
150.0000 mg | ORAL_TABLET | Freq: Every day | ORAL | Status: DC
  Administered 2020-09-28 – 2020-10-03 (×6): 150 mg via ORAL
  Filled 2020-09-28 (×10): qty 3

## 2020-09-28 MED ORDER — LORAZEPAM 1 MG PO TABS
1.0000 mg | ORAL_TABLET | Freq: Four times a day (QID) | ORAL | Status: DC | PRN
  Filled 2020-09-28: qty 1

## 2020-09-28 MED ORDER — SERTRALINE HCL 100 MG PO TABS
100.0000 mg | ORAL_TABLET | Freq: Every day | ORAL | Status: DC
  Administered 2020-09-29: 100 mg via ORAL
  Filled 2020-09-28 (×2): qty 1

## 2020-09-28 MED ORDER — LEVOTHYROXINE SODIUM 25 MCG PO TABS
25.0000 ug | ORAL_TABLET | Freq: Every day | ORAL | Status: DC
  Administered 2020-09-29 – 2020-10-04 (×6): 25 ug via ORAL
  Filled 2020-09-28 (×8): qty 1

## 2020-09-28 NOTE — ED Notes (Signed)
Pt belongings in locker 19.
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
## 2020-09-28 NOTE — Progress Notes (Signed)
Patient was accepted to [facility].    Meets inpatient criteria per Melbourne Abts, PA-C.    Attending physician is Dr. Jola Babinski.    Notified Ryta [name], RN of acceptance.  Nurses call report to [phone].    Patient can arrive 09/28/2020 after 11:00 AM.    Penni Homans, MSW, LCSW 09/28/2020 10:30 AM
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
## 2020-09-28 NOTE — ED Notes (Signed)
Pt mom left due to her having  Disabled husband at home she needed to take care of. Pt mom gave me his medications which are Mirtazapine 15 mg tab take 1@bed  time. Sertraline 100 mg  Tab twice a day. Quetiapine Fum ER 150 mg  Take 1 Tab@bedtime 

## 2020-09-28 NOTE — Discharge Instructions (Signed)
Discharge to [facility] 
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
## 2020-09-28 NOTE — Progress Notes (Signed)
Patient woke up, took his medications, moved to room 301-2 per MD order.  Patient has been pleasant and cooperative.

## 2020-09-28 NOTE — Progress Notes (Signed)
   09/28/20 2200  Psych Admission Type (Psych Patients Only)  Admission Status Voluntary  Psychosocial Assessment  Patient Complaints Depression  Eye Contact Brief  Facial Expression Sad  Affect Depressed;Sad  Speech Logical/coherent  Interaction Minimal  Motor Activity Other (Comment) (wnl)  Appearance/Hygiene Unremarkable  Behavior Characteristics Cooperative  Mood Depressed;Sad  Thought Process  Coherency WDL  Content WDL  Delusions None reported or observed  Perception WDL  Hallucination None reported or observed (previous hx of AVH)  Judgment Poor  Confusion None  Danger to Self  Current suicidal ideation? Passive  Self-Injurious Behavior Some self-injurious ideation observed or expressed.  No lethal plan expressed   Agreement Not to Harm Self Yes  Description of Agreement verbal agreement to approach staff  Danger to Others  Danger to Others None reported or observed   Pt seen in his room. Pt endorses passive SI with thoughts of cutting himself. Pt contracts for safety agreeing to approach staff if he tries to hurt himself. Pt denies HI, AVH and pain. Pt says he has previously had AVH but not reporting it right now. Pt rates depression 20/10. Pt has very depressed sad affect. Takes medications as prescribed.

## 2020-09-28 NOTE — ED Notes (Signed)
Pt sleeping@this time. Breathing even and unlabored. Will continue to monitor for safety 

## 2020-09-28 NOTE — Progress Notes (Signed)
Dominic Bryant transferred to [facility] per NP order. Discussed with the patient and all questions fully answered. An After Visit Summary, EMTALA and Med Necessity forms were printed and to be handed over to the reciveing nurse. Report given to [place], [place]. Patient escorted out, and transferred via safe transport. Dominic Bryant  09/28/2020 12:03 PM

## 2020-09-28 NOTE — Progress Notes (Signed)
Pt is resting quietly. Pt did not voice any complaints of pain or discomfort. Administered medications with no incident. Pt denies SI, HI and AVH at this time. Staff will monitor for pt's safety.

## 2020-09-29 MED ORDER — SERTRALINE HCL 50 MG PO TABS
150.0000 mg | ORAL_TABLET | Freq: Every day | ORAL | Status: DC
  Administered 2020-09-30 – 2020-10-04 (×5): 150 mg via ORAL
  Filled 2020-09-29 (×7): qty 1

## 2020-09-29 NOTE — [facility] Group Notes (Signed)
Type of Therapy/Topic: Group Therapy: Six Dimensions of Wellness  Participation Level: Active  Description of Group:  This group will address the concept of wellness and the six concepts of wellness: occupational, physical, social, intellectual, spiritual, and emotional. Patients will be encouraged to process areas in their lives that are out of balance and identify reasons for remaining unbalanced. Patients will be encouraged to explore ways to practice healthy habits daily to attain better physical and mental health outcomes.  Therapeutic Goals:  1. Identify aspects of wellness that they are doing well.  2. Identify aspects of wellness that they would like to improve upon.  3. Identify one action they can take to improve an aspect of wellness in their lives.  Summary of Patient Progress: Due to unforseen circumstances CSW provided the patient with a  packet of worksheets during group time.  The Pt accepted these worksheets and was offered an opportunity to discuss the worksheets with the CSW.  Codee Tutson, LCSWA Clinicial Social Worker [facility] 
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
## 2020-09-29 NOTE — Progress Notes (Signed)
[facility] MD Progress Note  09/29/2020 11:35 AM Dominic Bryant  MRN:  [id number] Subjective: Patient is a 25 year old male with a reported past psychiatric history significant for major depression who presented to the behavioral health urgent care center on 09/27/2020 with suicidal ideation.  Objective: Patient is seen and examined.  Patient is a 25 year old male with the above-stated past psychiatric history who is seen in follow-up.  He is much better today.  He stated he slept well last night.  He stated his mood was better.  He admitted that he had been off some of his medications for at least 2 months.  He did state that he had taken the 200 mg Zoloft dosages recently as Friday.  He denied suicidal ideation this morning.  He stated he was interested in getting involved in an IOP program.  He stated he had been involved with other outpatient programs in the past when he lived in [place].  He denied any side effects to his medications.  No auditory or visual hallucinations.  His vital signs are stable, he is afebrile.  Pulse oximetry was 100% on room air.  He slept 6.75 hours last night. Review of his laboratories from 4/5 showed a normal chemistries, essentially normal CBC, a mildly elevated TSH at 5.221 and a drug screen positive for marijuana.  Principal Problem: <principal problem not specified> Diagnosis: Active Problems:   Severe episode of recurrent major depressive disorder, with psychotic features (HCC)  Total Time spent with patient: 20 minutes  Past Psychiatric History: See admission H&P  Past Medical History:  Past Medical History:  Diagnosis Date  . Depression   . Heart murmur   . Thyroid disease     Past Surgical History:  Procedure Laterality Date  . ESOPHAGOGASTRODUODENOSCOPY (EGD) WITH PROPOFOL N/A 05/12/2015   Procedure: ESOPHAGOGASTRODUODENOSCOPY (EGD) WITH PROPOFOL;  Surgeon: Willis Modena, MD;  Location: [facility];  Service: Endoscopy;  Laterality: N/A;  . EUS N/A  05/12/2015   Procedure: UPPER ENDOSCOPIC ULTRASOUND (EUS) RADIAL;  Surgeon: Willis Modena, MD;  Location: [facility];  Service: Endoscopy;  Laterality: N/A;   Family History:  Family History  Problem Relation Age of Onset  . Crohn's disease Mother   . Asthma Father    Family Psychiatric  History: See admission H&P Social History:  Social History   Substance and Sexual Activity  Alcohol Use Yes   Comment: social     Social History   Substance and Sexual Activity  Drug Use Not Currently  . Types: Marijuana    Social History   Socioeconomic History  . Marital status: Single    Spouse name: Not on file  . Number of children: Not on file  . Years of education: Not on file  . Highest education level: Not on file  Occupational History  . Not on file  Tobacco Use  . Smoking status: Current Every Day Smoker    Packs/day: 1.00    Types: Cigarettes  . Smokeless tobacco: Never Used  Substance and Sexual Activity  . Alcohol use: Yes    Comment: social  . Drug use: Not Currently    Types: Marijuana  . Sexual activity: Not Currently    Partners: Female  Other Topics Concern  . Not on file  Social History Narrative  . Not on file   Social Determinants of Health   Financial Resource Strain: Not on file  Food Insecurity: Not on file  Transportation Needs: Not on file  Physical Activity: Not on file  Stress: Not on file  Social Connections: Not on file   Additional Social History:                         Sleep: Good  Appetite:  Good  Current Medications: Current Facility-Administered Medications  Medication Dose Route Frequency Provider Last Rate Last Admin  . acetaminophen (TYLENOL) tablet 650 mg  650 mg Oral Q6H PRN Antonieta Pert, MD      . alum & mag hydroxide-simeth (MAALOX/MYLANTA) 200-200-20 MG/5ML suspension 30 mL  30 mL Oral Q4H PRN Antonieta Pert, MD      . folic acid (FOLVITE) tablet 1 mg  1 mg Oral Daily Antonieta Pert, MD   1  mg at 09/29/20 0953  . hydrOXYzine (ATARAX/VISTARIL) tablet 25 mg  25 mg Oral TID PRN Antonieta Pert, MD      . levothyroxine (SYNTHROID) tablet 25 mcg  25 mcg Oral Q0600 Antonieta Pert, MD   25 mcg at 09/29/20 [phone]  . LORazepam (ATIVAN) tablet 1 mg  1 mg Oral Q6H PRN Antonieta Pert, MD      . magnesium hydroxide (MILK OF MAGNESIA) suspension 30 mL  30 mL Oral Daily PRN Antonieta Pert, MD      . mirtazapine (REMERON) tablet 15 mg  15 mg Oral QHS Antonieta Pert, MD   15 mg at 09/28/20 2150  . QUEtiapine (SEROQUEL XR) 24 hr tablet 150 mg  150 mg Oral QHS Antonieta Pert, MD   150 mg at 09/28/20 2150  . sertraline (ZOLOFT) tablet 100 mg  100 mg Oral Daily Antonieta Pert, MD   100 mg at 09/29/20 0953  . thiamine tablet 100 mg  100 mg Oral Daily Antonieta Pert, MD   100 mg at 09/29/20 0953  . traZODone (DESYREL) tablet 50 mg  50 mg Oral QHS PRN Antonieta Pert, MD        Lab Results:  Results for orders placed or performed during the hospital encounter of 09/27/20 (from the past 48 hour(s))  POCT Urine Drug Screen - (ICup)     Status: Abnormal (Preliminary result)   Collection Time: 09/28/20 12:38 AM  Result Value Ref Range   POC Amphetamine UR None Detected NONE DETECTED (Cut Off Level 1000 ng/mL)   POC Secobarbital (BAR) None Detected NONE DETECTED (Cut Off Level 300 ng/mL)   POC Buprenorphine (BUP) None Detected NONE DETECTED (Cut Off Level 10 ng/mL)   POC Oxazepam (BZO) None Detected NONE DETECTED (Cut Off Level 300 ng/mL)   POC Cocaine UR None Detected NONE DETECTED (Cut Off Level 300 ng/mL)   POC Methamphetamine UR None Detected NONE DETECTED (Cut Off Level 1000 ng/mL)   POC Morphine None Detected NONE DETECTED (Cut Off Level 300 ng/mL)   POC Oxycodone UR None Detected NONE DETECTED (Cut Off Level 100 ng/mL)   POC Methadone UR None Detected NONE DETECTED (Cut Off Level 300 ng/mL)   POC Marijuana UR Positive (A) NONE DETECTED (Cut Off Level 50 ng/mL)  CBC  with Differential/Platelet     Status: Abnormal   Collection Time: 09/28/20  1:07 AM  Result Value Ref Range   WBC 11.7 (H) 4.0 - 10.5 K/uL   RBC 5.81 4.22 - 5.81 MIL/uL   Hemoglobin 17.0 13.0 - 17.0 g/dL   HCT 96.0 (H) 45.4 - 09.8 %   MCV 90.5 80.0 - 100.0 fL   MCH 29.3 26.0 - 34.0 pg  MCHC 32.3 30.0 - 36.0 g/dL   RDW 34.7 42.5 - 95.6 %   Platelets 189 150 - 400 K/uL   nRBC 0.0 0.0 - 0.2 %   Neutrophils Relative % 55 %   Neutro Abs 6.5 1.7 - 7.7 K/uL   Lymphocytes Relative 29 %   Lymphs Abs 3.4 0.7 - 4.0 K/uL   Monocytes Relative 6 %   Monocytes Absolute 0.7 0.1 - 1.0 K/uL   Eosinophils Relative 8 %   Eosinophils Absolute 0.9 (H) 0.0 - 0.5 K/uL   Basophils Relative 1 %   Basophils Absolute 0.1 0.0 - 0.1 K/uL   Immature Granulocytes 1 %   Abs Immature Granulocytes 0.06 0.00 - 0.07 K/uL    Comment: Performed at [facility], [address], [place], [place] [address]  Comprehensive metabolic panel     Status: Abnormal   Collection Time: 09/28/20  1:07 AM  Result Value Ref Range   Sodium 139 135 - 145 mmol/L   Potassium 3.7 3.5 - 5.1 mmol/L   Chloride 105 98 - 111 mmol/L   CO2 24 22 - 32 mmol/L   Glucose, Bld 65 (L) 70 - 99 mg/dL    Comment: Glucose reference range applies only to samples taken after fasting for at least 8 hours.   BUN 6 6 - 20 mg/dL   Creatinine, Ser 4.33 0.61 - 1.24 mg/dL   Calcium 8.8 (L) 8.9 - 10.3 mg/dL   Total Protein 6.1 (L) 6.5 - 8.1 g/dL   Albumin 3.7 3.5 - 5.0 g/dL   AST 31 15 - 41 U/L   ALT 26 0 - 44 U/L   Alkaline Phosphatase 40 38 - 126 U/L   Total Bilirubin 0.8 0.3 - 1.2 mg/dL   GFR, Estimated >29 >51 mL/min    Comment: (NOTE) Calculated using the CKD-EPI Creatinine Equation (2021)    Anion gap 10 5 - 15    Comment: Performed at [facility], [address], [place], [place] [address]  TSH     Status: Abnormal   Collection Time: 09/28/20  1:07 AM  Result Value Ref Range   TSH 5.221 (H) 0.350 - 4.500 uIU/mL    Comment:  Performed by a 3rd Generation assay with a functional sensitivity of <=0.01 uIU/mL. Performed at [facility], [address], [place], [place] [address]   Resp Panel by RT-PCR (Flu A&B, Covid) Nasopharyngeal Swab     Status: None   Collection Time: 09/28/20  5:17 AM   Specimen: Nasopharyngeal Swab; Nasopharyngeal(NP) swabs in vial transport medium  Result Value Ref Range   SARS Coronavirus 2 by RT PCR NEGATIVE NEGATIVE    Comment: (NOTE) SARS-CoV-2 target nucleic acids are NOT DETECTED.  The SARS-CoV-2 RNA is generally detectable in upper respiratory specimens during the acute phase of infection. The lowest concentration of SARS-CoV-2 viral copies this assay can detect is 138 copies/mL. A negative result does not preclude SARS-Cov-2 infection and should not be used as the sole basis for treatment or other patient management decisions. A negative result may occur with  improper specimen collection/handling, submission of specimen other than nasopharyngeal swab, presence of viral mutation(s) within the areas targeted by this assay, and inadequate number of viral copies(<138 copies/mL). A negative result must be combined with clinical observations, patient history, and epidemiological information. The expected result is Negative.  Fact Sheet for Patients:  BloggerCourse.com  Fact Sheet for Healthcare Providers:  SeriousBroker.it  This test is no  t yet approved or cleared by the [place] and  has been authorized for detection and/or diagnosis of SARS-CoV-2 by FDA under an Emergency Use Authorization (EUA). This EUA will remain  in effect (meaning this test can be used) for the duration of the COVID-19 declaration under Section 564(b)(1) of the Act, 21 U.S.C.section 360bbb-3(b)(1), unless the authorization is terminated  or revoked sooner.       Influenza A by PCR NEGATIVE NEGATIVE   Influenza B by PCR NEGATIVE  NEGATIVE    Comment: (NOTE) The Xpert Xpress SARS-CoV-2/FLU/RSV plus assay is intended as an aid in the diagnosis of influenza from Nasopharyngeal swab specimens and should not be used as a sole basis for treatment. Nasal washings and aspirates are unacceptable for Xpert Xpress SARS-CoV-2/FLU/RSV testing.  Fact Sheet for Patients: BloggerCourse.com  Fact Sheet for Healthcare Providers: SeriousBroker.it  This test is not yet approved or cleared by the [place] FDA and has been authorized for detection and/or diagnosis of SARS-CoV-2 by FDA under an Emergency Use Authorization (EUA). This EUA will remain in effect (meaning this test can be used) for the duration of the COVID-19 declaration under Section 564(b)(1) of the Act, 21 U.S.C. section 360bbb-3(b)(1), unless the authorization is terminated or revoked.  Performed at [facility], [address] [place], [place] [address]     Blood Alcohol level:  No results found for: [facility]  Metabolic Disorder Labs: Lab Results  Component Value Date   HGBA1C 4.6 07/01/2020   No results found for: PROLACTIN Lab Results  Component Value Date   CHOL 189 07/01/2020   TRIG 91.0 07/01/2020   HDL 40.50 07/01/2020   CHOLHDL 5 07/01/2020   VLDL 18.2 07/01/2020   LDLCALC 131 (H) 07/01/2020    Physical Findings: AIMS: Facial and Oral Movements Muscles of Facial Expression: None, normal Lips and Perioral Area: None, normal Jaw: None, normal Tongue: None, normal,Extremity Movements Upper (arms, wrists, hands, fingers): None, normal Lower (legs, knees, ankles, toes): None, normal, Trunk Movements Neck, shoulders, hips: None, normal, Overall Severity Severity of abnormal movements (highest score from questions above): None, normal Incapacitation due to abnormal movements: None, normal Patient's awareness of abnormal movements (rate only patient's report): No Awareness, Dental  Status Current problems with teeth and/or dentures?: No Does patient usually wear dentures?: No  CIWA:    COWS:     Musculoskeletal: Strength & Muscle Tone: within normal limits Gait & Station: normal Patient leans: N/A  Psychiatric Specialty Exam:  Presentation  General Appearance: Fairly Groomed  Eye Contact:Fair  Speech:Normal Rate  Speech Volume:Decreased  Handedness:Right   Mood and Affect  Mood:Depressed  Affect:Appropriate   Thought Process  Thought Processes:Coherent  Descriptions of Associations:Intact  Orientation:Full (Time, Place and Person)  Thought Content:Logical  History of Schizophrenia/Schizoaffective disorder:No  Duration of Psychotic Symptoms:Greater than six months  Hallucinations:Hallucinations: None Description of Auditory Hallucinations: See HPI for details Description of Visual Hallucinations: See HPI for details  Ideas of Reference:None  Suicidal Thoughts:Suicidal Thoughts: Yes, Passive SI Active Intent and/or Plan: Without Intent  Homicidal Thoughts:Homicidal Thoughts: No   Sensorium  Memory:Immediate Good  Judgment:Fair  Insight:Fair   Executive Functions  Concentration:Good  Attention Span:Good  Recall:Good  Fund of Knowledge:Good  Language:Good   Psychomotor Activity  Psychomotor Activity:Psychomotor Activity: Normal   Assets  Assets:Communication Skills; Desire for Improvement; Resilience; Social Support   Sleep  Sleep:Sleep: Good Number of Hours of Sleep: 7    Physical Exam: Physical Exam Vitals and nursing note  reviewed.  HENT:     Head: Normocephalic and atraumatic.  Pulmonary:     Effort: Pulmonary effort is normal.  Neurological:     General: No focal deficit present.     Mental Status: He is alert and oriented to person, place, and time.    ROS Blood pressure 99/62, pulse (!) 54, temperature 97.8 F (36.6 C), temperature source Oral, resp. rate 18, SpO2 100 %. There is no  height or weight on file to calculate BMI.   Treatment Plan Summary: Daily contact with patient to assess and evaluate symptoms and progress in treatment, Medication management and Plan : Patient is seen and examined.  Patient is a 25 year old male with the above-stated past psychiatric history who is seen in follow-up.   Diagnosis: 1.  Major depression, recurrent, severe without psychotic features 2.  Posttraumatic stress disorder  Pertinent findings on examination today: 1.  Mood is much improved. 2.  Suicidal ideation has decreased. 3.  Sleep is improved. 4.  Patient is interested in becoming involved in intensive outpatient program.  Plan: 1.  Continue folic acid 1 mg p.o. daily for nutritional supplementation. 2.  Continue hydroxyzine 25 mg p.o. 3 times daily as needed anxiety. 3.  Continue levothyroxine 25 mcg p.o. daily for hypothyroidism. 4.  Continue lorazepam 1 mg p.o. every 6 hours as needed a CIWA greater than 10. 5.  Continue mirtazapine 15 mg p.o. nightly for mood, and sleep. 6.  Continue Seroquel extended release 150 mg p.o. nightly for mood stability and sleep. 7.  Increase sertraline to 150 mg p.o. daily for mood and anxiety. 8.  Continue thiamine 100 mg p.o. daily for nutritional supplementation. 9.  Continue trazodone 50 mg p.o. nightly as needed insomnia. 10.  Discussed with social work the possibility of an intensive outpatient program after discharge. 11.  Disposition planning-in progress.  Antonieta PertGreg Lawson Danay Mckellar, MD 09/29/2020, 11:35 AM

## 2020-09-29 NOTE — Progress Notes (Signed)
RN reviewed Robert Moore's (student RN) documentation of pt's flowsheets and assessment and this RN is in agreement with documentation and pt's plan of care.  

## 2020-09-29 NOTE — Progress Notes (Cosign Needed)
Adult Psychoeducational Group Note  Date:  09/29/2020 Time:  11:57 AM  Group Topic/Focus:  Goals Group:   The focus of this group is to help patients establish daily goals to achieve during treatment and discuss how the patient can incorporate goal setting into their daily lives to aide in recovery.  Participation Level:  Active  Participation Quality:  Attentive  Affect:  Appropriate  Cognitive:  Alert and Appropriate  Insight: Appropriate, Good and Improving  Engagement in Group:  Improving  Modes of Intervention:  Discussion  Additional Comments:  Pt attended group and participated in discussion.  Konnar Ben R Sandro Burgo 09/29/2020, 11:57 AM

## 2020-09-29 NOTE — Progress Notes (Signed)
   09/29/20 2021  Psych Admission Type (Psych Patients Only)  Admission Status Voluntary  Psychosocial Assessment  Patient Complaints Depression  Eye Contact Brief;Avoids  Facial Expression Sad;Worried;Anxious  Affect Depressed;Sad  Speech Logical/coherent  Interaction Minimal  Motor Activity Other (Comment) (wnl)  Appearance/Hygiene Unremarkable  Behavior Characteristics Cooperative  Mood Depressed;Sad  Thought Process  Coherency WDL  Content WDL  Delusions None reported or observed  Perception WDL  Hallucination None reported or observed  Judgment Poor  Confusion None  Danger to Self  Current suicidal ideation? Denies  Danger to Others  Danger to Others None reported or observed

## 2020-09-29 NOTE — Progress Notes (Signed)
   09/29/20 1000  Psych Admission Type (Psych Patients Only)  Admission Status Voluntary  Psychosocial Assessment  Patient Complaints Anxiety;Depression;Sadness  Eye Contact Brief;Avoids  Facial Expression Anxious;Sad;Worried  Affect Depressed;Sad;Anxious  Speech Logical/coherent  Interaction Minimal  Motor Activity Other (Comment) (wnl)  Appearance/Hygiene Unremarkable  Behavior Characteristics Cooperative;Appropriate to situation  Mood Depressed;Sad  Thought Process  Coherency WDL  Content Blaming self  Delusions None reported or observed  Perception WDL  Hallucination None reported or observed (per admission Hx of AVH)  Judgment Poor  Confusion None  Danger to Self  Current suicidal ideation? Passive  Self-Injurious Behavior Some self-injurious ideation observed or expressed.  No lethal plan expressed   Agreement Not to Harm Self Yes  Description of Agreement verbal agreement to approach staff  Danger to Others  Danger to Others None reported or observed

## 2020-09-29 NOTE — Tx Team (Signed)
Interdisciplinary Treatment and Diagnostic Plan Update  09/29/2020 Time of Session: 9:00am  Dominic Bryant MRN: [id number]  Principal Diagnosis: <principal problem not specified>  Secondary Diagnoses: Active Problems:   Severe episode of recurrent major depressive disorder, with psychotic features ([place])   Current Medications:  Current Facility-Administered Medications  Medication Dose Route Frequency Provider Last Rate Last Admin  . acetaminophen (TYLENOL) tablet 650 mg  650 mg Oral Q6H PRN Sharma Covert, MD      . alum & mag hydroxide-simeth (MAALOX/MYLANTA) 200-200-20 MG/5ML suspension 30 mL  30 mL Oral Q4H PRN Sharma Covert, MD      . folic acid (FOLVITE) tablet 1 mg  1 mg Oral Daily Sharma Covert, MD   1 mg at 09/29/20 0953  . hydrOXYzine (ATARAX/VISTARIL) tablet 25 mg  25 mg Oral TID PRN Sharma Covert, MD      . levothyroxine (SYNTHROID) tablet 25 mcg  25 mcg Oral Q0600 Sharma Covert, MD   25 mcg at 09/29/20 [phone]  . LORazepam (ATIVAN) tablet 1 mg  1 mg Oral Q6H PRN Sharma Covert, MD      . magnesium hydroxide (MILK OF MAGNESIA) suspension 30 mL  30 mL Oral Daily PRN Sharma Covert, MD      . mirtazapine (REMERON) tablet 15 mg  15 mg Oral QHS Sharma Covert, MD   15 mg at 09/28/20 2150  . QUEtiapine (SEROQUEL XR) 24 hr tablet 150 mg  150 mg Oral QHS Sharma Covert, MD   150 mg at 09/28/20 2150  . sertraline (ZOLOFT) tablet 100 mg  100 mg Oral Daily Sharma Covert, MD   100 mg at 09/29/20 0953  . thiamine tablet 100 mg  100 mg Oral Daily Sharma Covert, MD   100 mg at 09/29/20 0953  . traZODone (DESYREL) tablet 50 mg  50 mg Oral QHS PRN Sharma Covert, MD       PTA Medications: Medications Prior to Admission  Medication Sig Dispense Refill Last Dose  . levothyroxine (SYNTHROID) 50 MCG tablet Take 1 tablet (50 mcg total) by mouth daily at 6 (six) AM.     . QUEtiapine (SEROQUEL XR) 150 MG 24 hr tablet Take 1 tablet (150 mg total) by  mouth at bedtime. 90 tablet      Patient Stressors:    Patient Strengths:    Treatment Modalities: Medication Management, Group therapy, Case management,  1 to 1 session with clinician, Psychoeducation, Recreational therapy.   Physician Treatment Plan for Primary Diagnosis: <principal problem not specified> Long Term Goal(s): Improvement in symptoms so as ready for discharge Improvement in symptoms so as ready for discharge   Short Term Goals: Ability to identify changes in lifestyle to reduce recurrence of condition will improve Ability to verbalize feelings will improve Ability to disclose and discuss suicidal ideas Ability to demonstrate self-control will improve Ability to identify and develop effective coping behaviors will improve Ability to maintain clinical measurements within normal limits will improve Compliance with prescribed medications will improve Ability to identify triggers associated with substance abuse/mental health issues will improve Ability to identify changes in lifestyle to reduce recurrence of condition will improve Ability to verbalize feelings will improve Ability to disclose and discuss suicidal ideas Ability to demonstrate self-control will improve Ability to identify and develop effective coping behaviors will improve Ability to maintain clinical measurements within normal limits will improve Compliance with prescribed medications will improve Ability to identify triggers associated with  substance abuse/mental health issues will improve  Medication Management: Evaluate patient's response, side effects, and tolerance of medication regimen.  Therapeutic Interventions: 1 to 1 sessions, Unit Group sessions and Medication administration.  Evaluation of Outcomes: Not Met  Physician Treatment Plan for Secondary Diagnosis: Active Problems:   Severe episode of recurrent major depressive disorder, with psychotic features ([place])  Long Term Goal(s):  Improvement in symptoms so as ready for discharge Improvement in symptoms so as ready for discharge   Short Term Goals: Ability to identify changes in lifestyle to reduce recurrence of condition will improve Ability to verbalize feelings will improve Ability to disclose and discuss suicidal ideas Ability to demonstrate self-control will improve Ability to identify and develop effective coping behaviors will improve Ability to maintain clinical measurements within normal limits will improve Compliance with prescribed medications will improve Ability to identify triggers associated with substance abuse/mental health issues will improve Ability to identify changes in lifestyle to reduce recurrence of condition will improve Ability to verbalize feelings will improve Ability to disclose and discuss suicidal ideas Ability to demonstrate self-control will improve Ability to identify and develop effective coping behaviors will improve Ability to maintain clinical measurements within normal limits will improve Compliance with prescribed medications will improve Ability to identify triggers associated with substance abuse/mental health issues will improve     Medication Management: Evaluate patient's response, side effects, and tolerance of medication regimen.  Therapeutic Interventions: 1 to 1 sessions, Unit Group sessions and Medication administration.  Evaluation of Outcomes: Not Met   RN Treatment Plan for Primary Diagnosis: <principal problem not specified> Long Term Goal(s): Knowledge of disease and therapeutic regimen to maintain health will improve  Short Term Goals: Ability to remain free from injury will improve, Ability to participate in decision making will improve, Ability to verbalize feelings will improve, Ability to disclose and discuss suicidal ideas and Ability to identify and develop effective coping behaviors will improve  Medication Management: RN will administer  medications as ordered by provider, will assess and evaluate patient's response and provide education to patient for prescribed medication. RN will report any adverse and/or side effects to prescribing provider.  Therapeutic Interventions: 1 on 1 counseling sessions, Psychoeducation, Medication administration, Evaluate responses to treatment, Monitor vital signs and CBGs as ordered, Perform/monitor CIWA, COWS, AIMS and Fall Risk screenings as ordered, Perform wound care treatments as ordered.  Evaluation of Outcomes: Not Met   LCSW Treatment Plan for Primary Diagnosis: <principal problem not specified> Long Term Goal(s): Safe transition to appropriate next level of care at discharge, Engage patient in therapeutic group addressing interpersonal concerns.  Short Term Goals: Engage patient in aftercare planning with referrals and resources, Increase social support, Increase emotional regulation, Facilitate acceptance of mental health diagnosis and concerns, Identify triggers associated with mental health/substance abuse issues and Increase skills for wellness and recovery  Therapeutic Interventions: Assess for all discharge needs, 1 to 1 time with Social worker, Explore available resources and support systems, Assess for adequacy in community support network, Educate family and significant other(s) on suicide prevention, Complete Psychosocial Assessment, Interpersonal group therapy.  Evaluation of Outcomes: Not Met   Progress in Treatment: Attending groups: No. Participating in groups: No. Taking medication as prescribed: Yes. Toleration medication: Yes. Family/Significant other contact made: Yes, individual(s) contacted:  If consents are provided  Patient understands diagnosis: Yes. and No. Discussing patient identified problems/goals with staff: Yes. Medical problems stabilized or resolved: Yes. Denies suicidal/homicidal ideation: Yes. Issues/concerns per patient self-inventory:  No.   New  problem(s) identified: No, Describe:  None   New Short Term/Long Term Goal(s): medication stabilization, elimination of SI thoughts, development of comprehensive mental wellness plan.   Patient Goals: Patient did not attend   Discharge Plan or Barriers: Patient recently admitted. CSW will continue to follow and assess for appropriate referrals and possible discharge planning.   Reason for Continuation of Hospitalization: Depression Medication stabilization Suicidal ideation  Estimated Length of Stay: 3 to 5 days   Attendees: Patient: Did not attend  09/29/2020   Physician: Myles Lipps, MD 09/29/2020   Nursing:  09/29/2020   RN Care Manager: 09/29/2020   Social Worker: Verdis Frederickson, [place] 09/29/2020   Recreational Therapist:  09/29/2020   Other:  09/29/2020   Other:  09/29/2020   Other: 09/29/2020     Scribe for Treatment Team: Darleen Crocker, [place] 09/29/2020 11:03 AM

## 2020-09-29 NOTE — Progress Notes (Signed)
Recreation Therapy Notes  Date: 4.6.22 Time: 0930 Location: [address] Dayroom  Group Topic: Stress Management   Goal Area(s) Addresses:  Patient will actively participate in stress management techniques presented during session.  Patient will successfully identify benefit of practicing stress management post d/c.   Intervention: Guided exercise with ambient sound and script  Activity :Guided Imagery  LRT read a script that lead patients to envision floating on a cloud and going to any place they desire.  Patients were to listen to follow along as script was read to fully participate in activity.  Education:  Stress Management, Discharge Planning.   Education Outcome: Acknowledges education  Clinical Observations/Feedback: Pt did not attend group session.    Dominic Bryant, LRT/CTRS         Brazos Sandoval A 09/29/2020 11:10 AM 

## 2020-09-30 MED ORDER — HALOPERIDOL LACTATE 5 MG/ML IJ SOLN: 5.0000 mg | Freq: Four times a day (QID) | INTRAMUSCULAR | Status: DC | PRN

## 2020-09-30 MED ORDER — DIPHENHYDRAMINE HCL 50 MG/ML IJ SOLN: 50.0000 mg | Freq: Four times a day (QID) | INTRAMUSCULAR | Status: DC | PRN

## 2020-09-30 MED ORDER — DIPHENHYDRAMINE HCL 25 MG PO CAPS: 50.0000 mg | ORAL_CAPSULE | Freq: Four times a day (QID) | ORAL | Status: DC | PRN

## 2020-09-30 MED ORDER — LORAZEPAM 2 MG/ML IJ SOLN: 2.0000 mg | Freq: Four times a day (QID) | INTRAMUSCULAR | Status: DC | PRN
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
## 2020-09-30 NOTE — Progress Notes (Signed)
Patient denied SI and HI, contracts for safety.  Denied A/V hallucinations.  Denied pain. Medications administered per MD orders.  Emotional support and encouragement given patient. Safety maintained with 15 minute checks.   

## 2020-09-30 NOTE — Progress Notes (Addendum)
Patient denied SI and HI, contracts for safety.  Denied A/V hallucinations.  Denied pain. Medications administered per MD orders.  Emotional support and encouragement given patient. Safety maintained with 15 minute checks.   

## 2020-09-30 NOTE — Plan of Care (Signed)
Nurse discussed coping skills with patient.  
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
## 2020-10-01 NOTE — Progress Notes (Signed)
Pt reports that he feels "much better" today.  Denies feeling depressed; endorsed having  anxiety. Pt responded well to prn vistaril. Pt interacting with peers on the unit, making good eye contact with them, laughing and readily engaged in conversation.  RN assessed for needs and concerns and provided support.  RN administered medications per provider orders.  Pt remains safe on the unit with q 15 min checks in place.

## 2020-10-01 NOTE — Progress Notes (Signed)
Recreation Therapy Notes  Date: 4.8.22 Time: 0930 Location: [address] Dayroom  Group Topic: Stress Management  Goal Area(s) Addresses:  Patient will identify positive stress management techniques. Patient will identify benefits of using stress management post d/c.  Behavioral Response: Engaged  Intervention: Stress Management  Activity: Meditation.  LRT played a meditation that focused on making the most of your day and having a positive outlook on what your day can be when you have positive intentions.    Education:  Stress Management, Discharge Planning.   Education Outcome: Acknowledges Education  Clinical Observations/Feedback: Pt attended and participated in group.   Caroll Rancher, LRT/CTRS         Caroll Rancher A 10/01/2020 10:36 AM

## 2020-10-01 NOTE — BHH Group Notes (Signed)
Adult Psychoeducational Group Note  Date:  10/01/2020 Time:  3:58 PM  Group Topic/Focus:  Developing a Wellness Toolbox:   The focus of this group is to help patients develop a "wellness toolbox" with skills and strategies to promote recovery upon discharge.  Participation Level:  Active  Participation Quality:  Appropriate  Affect:  Appropriate  Cognitive:  Appropriate  Insight: Appropriate  Engagement in Group:  Engaged  Modes of Intervention:  Education  Additional Comments:  Patient attended morning group and participated.   [name] W [name] 10/01/2020, 3:58 PM

## 2020-10-01 NOTE — B[facility] Group Notes (Signed)
LCSW Aftercare Discharge Planning Group Note  10/01/2020   Type of Group and Topic: Psychoeducational Group: Discharge Planning  Participation Level: Active  Description of Group: Discharge planning group reviews patient's anticipated discharge plans and assists patients to anticipate and address any barriers to wellness/recovery in the community. Suicide prevention education is reviewed with patients in group.  Therapeutic Goals  1. Patients will state their anticipated discharge plan and mental health aftercare 2. Patients will identify potential barriers to wellness in the community setting 3. Patients will engage in problem solving, solution focused discussion of ways to anticipate and address barriers to wellness/recovery  Summary of Patient Progress: Pt shared his name and that he was grateful that he woke up this morning during introductions. Pt shared that he is looking forward to having a cigarette and seeing his puppies when he discharges from [facility].   Supports:  -Therapeutic Modalities:  -Motivational Interviewing  Felizardo Hoffmann, Theresia Majors  10/01/2020 3:07 PM

## 2020-10-01 NOTE — Progress Notes (Signed)
[facility] MD Progress Note  10/01/2020 11:28 AM Dominic Bryant  MRN:  [id number] Subjective:  Patient is a 25 year old male with a reported past psychiatric history significant for major depression who presented to the behavioral health urgent care center on 09/27/2020 with suicidal ideation.  Objective: Patient is seen and examined.  Patient is a 25 year old male with the above-stated past psychiatric history who is seen in follow-up.  Yesterday was very complicated for the patient.  He is learned that most likely he would be arrested and prosecuted.  Initially he was quite tearful, but later in the evening became more irritable.  I think that is probably most likely understandable.  He stated he wanted to leave yesterday.  This morning he is more rational.  He stated that his mood is fairly stable, but he is anxious.  He saw police come to the hospital this morning to present involuntary commitment paperwork and he was afraid they were here to arrest him.  I assured him that we would let them know if that was to take place.  He stated he still having occasional thoughts of suicide, but no plan at this point.  He asked about whether we had heard anything else about the legal circumstances and I told him we had not heard anything from [facility] police and asked if he had heard anything from his mother and he had not.  He is not tearful this morning.  He is depressed and mildly anxious.  His sleep is adequate.  He denied any auditory or visual hallucinations.  He denied any side effects to his current medications.  His vital signs are stable, he is afebrile.  No new laboratories.  Principal Problem: <principal problem not specified> Diagnosis: Active Problems:   Severe episode of recurrent major depressive disorder, with psychotic features (HCC)  Total Time spent with patient: 20 minutes  Past Psychiatric History: See admission H&P  Past Medical History:  Past Medical History:  Diagnosis Date  . Depression    . Heart murmur   . Thyroid disease     Past Surgical History:  Procedure Laterality Date  . ESOPHAGOGASTRODUODENOSCOPY (EGD) WITH PROPOFOL N/A 05/12/2015   Procedure: ESOPHAGOGASTRODUODENOSCOPY (EGD) WITH PROPOFOL;  Surgeon: Willis Modena, MD;  Location: [facility];  Service: Endoscopy;  Laterality: N/A;  . EUS N/A 05/12/2015   Procedure: UPPER ENDOSCOPIC ULTRASOUND (EUS) RADIAL;  Surgeon: Willis Modena, MD;  Location: [facility];  Service: Endoscopy;  Laterality: N/A;   Family History:  Family History  Problem Relation Age of Onset  . Crohn's disease Mother   . Asthma Father    Family Psychiatric  History: See admission H&P Social History:  Social History   Substance and Sexual Activity  Alcohol Use Yes   Comment: social     Social History   Substance and Sexual Activity  Drug Use Not Currently  . Types: Marijuana    Social History   Socioeconomic History  . Marital status: Single    Spouse name: Not on file  . Number of children: Not on file  . Years of education: Not on file  . Highest education level: Not on file  Occupational History  . Not on file  Tobacco Use  . Smoking status: Current Every Day Smoker    Packs/day: 1.00    Types: Cigarettes  . Smokeless tobacco: Never Used  Substance and Sexual Activity  . Alcohol use: Yes    Comment: social  . Drug use: Not Currently    Types:  Marijuana  . Sexual activity: Not Currently    Partners: Female  Other Topics Concern  . Not on file  Social History Narrative  . Not on file   Social Determinants of Health   Financial Resource Strain: Not on file  Food Insecurity: Not on file  Transportation Needs: Not on file  Physical Activity: Not on file  Stress: Not on file  Social Connections: Not on file   Additional Social History:                         Sleep: Good  Appetite:  Fair  Current Medications: Current Facility-Administered Medications  Medication Dose Route Frequency  Provider Last Rate Last Admin  . acetaminophen (TYLENOL) tablet 650 mg  650 mg Oral Q6H PRN Antonieta Pert, MD      . alum & mag hydroxide-simeth (MAALOX/MYLANTA) 200-200-20 MG/5ML suspension 30 mL  30 mL Oral Q4H PRN Antonieta Pert, MD      . diphenhydrAMINE (BENADRYL) capsule 50 mg  50 mg Oral Q6H PRN Nwoko, Uchenna E, PA       Or  . diphenhydrAMINE (BENADRYL) injection 50 mg  50 mg Intramuscular Q6H PRN Nwoko, Uchenna E, PA       Or  . haloperidol lactate (HALDOL) injection 5 mg  5 mg Intramuscular Q6H PRN Nwoko, Uchenna E, PA       Or  . LORazepam (ATIVAN) injection 2 mg  2 mg Intramuscular Q6H PRN Nwoko, Uchenna E, PA      . folic acid (FOLVITE) tablet 1 mg  1 mg Oral Daily Antonieta Pert, MD   1 mg at 10/01/20 0817  . hydrOXYzine (ATARAX/VISTARIL) tablet 25 mg  25 mg Oral TID PRN Antonieta Pert, MD   25 mg at 10/01/20 0819  . levothyroxine (SYNTHROID) tablet 25 mcg  25 mcg Oral Q0600 Antonieta Pert, MD   25 mcg at 10/01/20 [phone]  . LORazepam (ATIVAN) tablet 1 mg  1 mg Oral Q6H PRN Antonieta Pert, MD      . magnesium hydroxide (MILK OF MAGNESIA) suspension 30 mL  30 mL Oral Daily PRN Antonieta Pert, MD      . mirtazapine (REMERON) tablet 15 mg  15 mg Oral QHS Antonieta Pert, MD   15 mg at 09/30/20 2040  . QUEtiapine (SEROQUEL XR) 24 hr tablet 150 mg  150 mg Oral QHS Antonieta Pert, MD   150 mg at 09/30/20 2040  . sertraline (ZOLOFT) tablet 150 mg  150 mg Oral Daily Antonieta Pert, MD   150 mg at 10/01/20 0817  . thiamine tablet 100 mg  100 mg Oral Daily Antonieta Pert, MD   100 mg at 10/01/20 0816  . traZODone (DESYREL) tablet 50 mg  50 mg Oral QHS PRN Antonieta Pert, MD        Lab Results: No results found for this or any previous visit (from the past 48 hour(s)).  Blood Alcohol level:  No results found for: [facility]  Metabolic Disorder Labs: Lab Results  Component Value Date   HGBA1C 4.6 07/01/2020   No results found for: PROLACTIN Lab  Results  Component Value Date   CHOL 189 07/01/2020   TRIG 91.0 07/01/2020   HDL 40.50 07/01/2020   CHOLHDL 5 07/01/2020   VLDL 18.2 07/01/2020   LDLCALC 131 (H) 07/01/2020    Physical Findings: AIMS: Facial and Oral Movements Muscles of Facial Expression:  None, normal Lips and Perioral Area: None, normal Jaw: None, normal Tongue: None, normal,Extremity Movements Upper (arms, wrists, hands, fingers): None, normal Lower (legs, knees, ankles, toes): None, normal, Trunk Movements Neck, shoulders, hips: None, normal, Overall Severity Severity of abnormal movements (highest score from questions above): None, normal Incapacitation due to abnormal movements: None, normal Patient's awareness of abnormal movements (rate only patient's report): No Awareness, Dental Status Current problems with teeth and/or dentures?: No Does patient usually wear dentures?: No  CIWA:    COWS:     Musculoskeletal: Strength & Muscle Tone: within normal limits Gait & Station: normal Patient leans: N/A  Psychiatric Specialty Exam:  Presentation  General Appearance: Fairly Groomed  Eye Contact:Fair  Speech:Normal Rate  Speech Volume:Decreased  Handedness:Right   Mood and Affect  Mood:Depressed  Affect:Congruent   Thought Process  Thought Processes:Coherent  Descriptions of Associations:Intact  Orientation:Full (Time, Place and Person)  Thought Content:Logical  History of Schizophrenia/Schizoaffective disorder:No  Duration of Psychotic Symptoms:Greater than six months  Hallucinations:Hallucinations: None  Ideas of Reference:None  Suicidal Thoughts:Suicidal Thoughts: Yes, Passive SI Active Intent and/or Plan: Without Plan  Homicidal Thoughts:Homicidal Thoughts: No   Sensorium  Memory:Immediate Good; Recent Good; Remote Good  Judgment:Fair  Insight:Fair   Executive Functions  Concentration:Good  Attention Span:Good  Recall:Good  Fund of  Knowledge:Good  Language:Good   Psychomotor Activity  Psychomotor Activity:Psychomotor Activity: Increased   Assets  Assets:Communication Skills; Desire for Improvement; Resilience; Social Support; Housing   Sleep  Sleep:Sleep: Good Number of Hours of Sleep: 6.75    Physical Exam: Physical Exam Vitals and nursing note reviewed.  Constitutional:      Appearance: Normal appearance.  HENT:     Head: Normocephalic and atraumatic.  Pulmonary:     Effort: Pulmonary effort is normal.  Neurological:     General: No focal deficit present.     Mental Status: He is alert and oriented to person, place, and time.    ROS Blood pressure 111/77, pulse 65, temperature 97.6 F (36.4 C), temperature source Oral, resp. rate 18, SpO2 100 %. There is no height or weight on file to calculate BMI.   Treatment Plan Summary: Daily contact with patient to assess and evaluate symptoms and progress in treatment, Medication management and Plan : Patient is seen and examined.  Patient is a 25 year old male with the above-stated past psychiatric history who is seen in follow-up.   Diagnosis: 1. Major depression, recurrent, severe without psychotic features 2. Posttraumatic stress disorder 3.  Hypothyroidism  Pertinent findings on examination today: 1.  He was initially tearful yesterday about the legal circumstances, became more irritable during the evening, but this morning is more accepting of the current circumstances. 2.  Patient admitted to suicidal thoughts that come and go without plan. 3.  No additional information with regard to the legal status.  Plan: 1. Continue folic acid 1 mg p.o. daily for nutritional supplementation. 2. Continue hydroxyzine 25 mg p.o. 3 times daily as needed anxiety. 3. Continue levothyroxine 25 mcg p.o. daily for hypothyroidism. 4. Continue lorazepam 1 mg p.o. every 6 hours as needed a CIWA greater than 10. 5. Continue mirtazapine 15 mg p.o. nightly  for mood, and sleep. 6. Continue Seroquel extended release 150 mg p.o. nightly for mood stability and sleep. 7. Increase sertraline to 150 mg p.o. daily for mood and anxiety. 8. Continue thiamine 100 mg p.o. daily for nutritional supplementation. 9. Continue trazodone 50 mg p.o. nightly as needed insomnia. 10.  We will provide him  with an area of privacy to discuss the current issues with regard to legal proceedings with his mother. 11.  I have discussed with staff that should appear to be at more increased risk of self-harm that they have the availability of placing him on close observation or even one-to-one if necessary. 12.  Disposition planning-in progress, much of what we will do will be dependent upon legal processes.  Antonieta Pert, MD 10/01/2020, 11:28 AM

## 2020-10-01 NOTE — Progress Notes (Signed)
Patient did attend the evening speaker AA meeting.  

## 2020-10-02 NOTE — B[facility] Group Notes (Addendum)
LCSW Group Therapy Note  10/02/2020   10:00-11:00am   Type of Therapy and Topic:  Group Therapy: Anger Cues and Responses  Participation Level:  Active   Description of Group:   In this group, patients learned how to recognize the physical, cognitive, emotional, and behavioral responses they have to anger-provoking situations.  They identified a recent time they became angry and how they reacted.  They analyzed how their reaction was possibly beneficial and how it was possibly unhelpful.  The group discussed a variety of healthier coping skills that could help with such a situation in the future.  Focus was placed on how helpful it is to recognize the underlying emotions to our anger, because working on those can lead to a more permanent solution as well as our ability to focus on the important rather than the urgent.  Therapeutic Goals: 1. Patients will remember their last incident of anger and how they felt emotionally and physically, what their thoughts were at the time, and how they behaved. 2. Patients will identify how their behavior at that time worked for them, as well as how it worked against them. 3. Patients will explore possible new behaviors to use in future anger situations. 4. Patients will learn that anger itself is normal and cannot be eliminated, and that healthier reactions can assist with resolving conflict rather than worsening situations.  Summary of Patient Progress:  The patient shared that his most recent time of anger was over the course of a 24-hour period from Sunday-Monday this week and said he was in an off and on argument with his girlfriend during that time.  She was telling him how she was thinking about killing herself, and this made him angry because he is suicidal too and did not need to hear what she was "planning."  He said that in that moment, he broke up with her, threw the phone down so that it shattered and is no longer usable, "lost it."  He said he then  "broke shit" and shouted at the top of his lungs.  He did not appear to gain insight during the group, continuing to justify his actions.  He was able to identify his underlying feelings as "disrespect, hurt" and was able to say girlfriend's statements were triggering his own suicidal feelings.  Therapeutic Modalities:   Cognitive Behavioral Therapy  Lynnell Chad

## 2020-10-02 NOTE — Progress Notes (Signed)
[facility] MD Progress Note  10/02/2020 10:51 AM Dominic Bryant  MRN:  [id number] Subjective:  Patient is a 25 year old male with a reported past psychiatric history significant for major depression who presented to the behavioral health urgent care center on 09/27/2020 with suicidal ideation.  Objective: Patient was seen in the assessment office where he was noted calm, cooperative and willingly answered questions.  Today he rates his anxiety and depression 2/10 with 10 being severe depression or anxiety.  He is attending to all group activities and believes he is learning coping skills.  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  He plans to engage in IOP after discharge although discharge plan is still up in the air to to his legal issues which was not brought up this morning.  He denies feeling suicidal or having thoughts to harm himself.  He denies psychotic symptoms and no mention of paranoia.  He mentioned that his sleep is improving. Principal Problem: <principal problem not specified> Diagnosis: Active Problems:   Severe episode of recurrent major depressive disorder, with psychotic features (HCC)  Total Time spent with patient: 15 minutes  Past Psychiatric History: See admission H&P  Past Medical History:  Past Medical History:  Diagnosis Date  . Depression   . Heart murmur   . Thyroid disease     Past Surgical History:  Procedure Laterality Date  . ESOPHAGOGASTRODUODENOSCOPY (EGD) WITH PROPOFOL N/A 05/12/2015   Procedure: ESOPHAGOGASTRODUODENOSCOPY (EGD) WITH PROPOFOL;  Surgeon: Willis Modena, MD;  Location: [facility];  Service: Endoscopy;  Laterality: N/A;  . EUS N/A 05/12/2015   Procedure: UPPER ENDOSCOPIC ULTRASOUND (EUS) RADIAL;  Surgeon: Willis Modena, MD;  Location: [facility];  Service: Endoscopy;  Laterality: N/A;   Family History:  Family History  Problem Relation Age of Onset  . Crohn's disease Mother   .  Asthma Father    Family Psychiatric  History: See admission H&P Social History:  Social History   Substance and Sexual Activity  Alcohol Use Yes   Comment: social     Social History   Substance and Sexual Activity  Drug Use Not Currently  . Types: Marijuana    Social History   Socioeconomic History  . Marital status: Single    Spouse name: Not on file  . Number of children: Not on file  . Years of education: Not on file  . Highest education level: Not on file  Occupational History  . Not on file  Tobacco Use  . Smoking status: Current Every Day Smoker    Packs/day: 1.00    Types: Cigarettes  . Smokeless tobacco: Never Used  Substance and Sexual Activity  . Alcohol use: Yes    Comment: social  . Drug use: Not Currently    Types: Marijuana  . Sexual activity: Not Currently    Partners: Female  Other Topics Concern  . Not on file  Social History Narrative  . Not on file   Social Determinants of Health   Financial Resource Strain: Not on file  Food Insecurity: Not on file  Transportation Needs: Not on file  Physical Activity: Not on file  Stress: Not on file  Social Connections: Not on file   Additional Social History:                         Sleep: Good  Appetite:  Fair  Current Medications: Current  Facility-Administered Medications  Medication Dose Route Frequency Provider Last Rate Last Admin  . acetaminophen (TYLENOL) tablet 650 mg  650 mg Oral Q6H PRN Antonieta Pert, MD      . alum & mag hydroxide-simeth (MAALOX/MYLANTA) 200-200-20 MG/5ML suspension 30 mL  30 mL Oral Q4H PRN Antonieta Pert, MD      . diphenhydrAMINE (BENADRYL) capsule 50 mg  50 mg Oral Q6H PRN Nwoko, Uchenna E, PA       Or  . diphenhydrAMINE (BENADRYL) injection 50 mg  50 mg Intramuscular Q6H PRN Nwoko, Uchenna E, PA       Or  . haloperidol lactate (HALDOL) injection 5 mg  5 mg Intramuscular Q6H PRN Nwoko, Uchenna E, PA       Or  . LORazepam (ATIVAN) injection 2  mg  2 mg Intramuscular Q6H PRN Nwoko, Uchenna E, PA      . folic acid (FOLVITE) tablet 1 mg  1 mg Oral Daily Antonieta Pert, MD   1 mg at 10/02/20 0858  . hydrOXYzine (ATARAX/VISTARIL) tablet 25 mg  25 mg Oral TID PRN Antonieta Pert, MD   25 mg at 10/01/20 2052  . levothyroxine (SYNTHROID) tablet 25 mcg  25 mcg Oral Q0600 Antonieta Pert, MD   25 mcg at 10/02/20 0558  . LORazepam (ATIVAN) tablet 1 mg  1 mg Oral Q6H PRN Antonieta Pert, MD      . magnesium hydroxide (MILK OF MAGNESIA) suspension 30 mL  30 mL Oral Daily PRN Antonieta Pert, MD      . mirtazapine (REMERON) tablet 15 mg  15 mg Oral QHS Antonieta Pert, MD   15 mg at 10/01/20 2052  . QUEtiapine (SEROQUEL XR) 24 hr tablet 150 mg  150 mg Oral QHS Antonieta Pert, MD   150 mg at 10/01/20 2051  . sertraline (ZOLOFT) tablet 150 mg  150 mg Oral Daily Antonieta Pert, MD   150 mg at 10/02/20 0857  . thiamine tablet 100 mg  100 mg Oral Daily Antonieta Pert, MD   100 mg at 10/02/20 0858  . traZODone (DESYREL) tablet 50 mg  50 mg Oral QHS PRN Antonieta Pert, MD   50 mg at 10/01/20 2052    Lab Results: No results found for this or any previous visit (from the past 48 hour(s)).  Blood Alcohol level:  No results found for: [facility]  Metabolic Disorder Labs: Lab Results  Component Value Date   HGBA1C 4.6 07/01/2020   No results found for: PROLACTIN Lab Results  Component Value Date   CHOL 189 07/01/2020   TRIG 91.0 07/01/2020   HDL 40.50 07/01/2020   CHOLHDL 5 07/01/2020   VLDL 18.2 07/01/2020   LDLCALC 131 (H) 07/01/2020    Physical Findings: AIMS: Facial and Oral Movements Muscles of Facial Expression: None, normal Lips and Perioral Area: None, normal Jaw: None, normal Tongue: None, normal,Extremity Movements Upper (arms, wrists, hands, fingers): None, normal Lower (legs, knees, ankles, toes): None, normal, Trunk Movements Neck, shoulders, hips: None, normal, Overall Severity Severity of  abnormal movements (highest score from questions above): None, normal Incapacitation due to abnormal movements: None, normal Patient's awareness of abnormal movements (rate only patient's report): No Awareness, Dental Status Current problems with teeth and/or dentures?: No Does patient usually wear dentures?: No  CIWA:    COWS:     Musculoskeletal: Strength & Muscle Tone: within normal limits Gait & Station: normal Patient leans: N/A  Psychiatric  Specialty Exam:  Presentation  General Appearance: Fairly Groomed  Eye Contact:Fair  Speech:Normal Rate  Speech Volume:Decreased  Handedness:Right   Mood and Affect  Mood:Depressed  Affect:Congruent   Thought Process  Thought Processes:Coherent  Descriptions of Associations:Intact  Orientation:Full (Time, Place and Person)  Thought Content:Logical  History of Schizophrenia/Schizoaffective disorder:No  Duration of Psychotic Symptoms:Greater than six months  Hallucinations:Hallucinations: None  Ideas of Reference:None  Suicidal Thoughts:Suicidal Thoughts: Yes, Passive SI Active Intent and/or Plan: Without Plan  Homicidal Thoughts:Homicidal Thoughts: No   Sensorium  Memory:Immediate Good; Recent Good; Remote Good  Judgment:Fair  Insight:Fair   Executive Functions  Concentration:Good  Attention Span:Good  Recall:Good  Fund of Knowledge:Good  Language:Good   Psychomotor Activity  Psychomotor Activity:Psychomotor Activity: Increased   Assets  Assets:Communication Skills; Desire for Improvement; Resilience; Social Support; Housing   Sleep  Sleep:Sleep: Good Number of Hours of Sleep: 6.75    Physical Exam: Physical Exam Vitals and nursing note reviewed.  Constitutional:      Appearance: Normal appearance.  HENT:     Head: Normocephalic and atraumatic.  Pulmonary:     Effort: Pulmonary effort is normal.  Neurological:     General: No focal deficit present.     Mental Status: He is  alert and oriented to person, place, and time.    ROS Blood pressure 100/71, pulse 76, temperature (!) 97.4 F (36.3 C), temperature source Oral, resp. rate 18, SpO2 100 %. There is no height or weight on file to calculate BMI.   Treatment Plan Summary: Daily contact with patient to assess and evaluate symptoms and progress in treatment, Medication management and Plan : Patient is seen and examined.  Patient is a 25 year old male with the above-stated past psychiatric history who is seen in follow-up.   Diagnosis: 1. Major depression, recurrent, severe without psychotic features 2. Posttraumatic stress disorder 3.  Hypothyroidism  Pertinent findings on examination today: 1.  Patient was calm, cooperative and engaged in meaningful conversation regarding his care and discharge plan-engage in IOP to continue treatment. 2.  Patient denied feeling suicidal or having thoughts of wanting to hurt himself. 3.  No additional information with regard to the legal status.  Plan: 1. Continue folic acid 1 mg p.o. daily for nutritional supplementation. 2. Continue hydroxyzine 25 mg p.o. 3 times daily as needed anxiety. 3. Continue levothyroxine 25 mcg p.o. daily for hypothyroidism. 4. Continue lorazepam 1 mg p.o. every 6 hours as needed a CIWA greater than 10. 5. Continue mirtazapine 15 mg p.o. nightly for mood, and sleep. 6. Continue Seroquel extended release 150 mg p.o. nightly for mood stability and sleep. 7. Continue Sertraline  150 mg p.o. daily for mood and anxiety. 8. Continue thiamine 100 mg p.o. daily for nutritional supplementation. 9. Continue trazodone 50 mg p.o. nightly as needed insomnia. 10.  We will provide him with an area of privacy to discuss the current issues with regard to legal proceedings with his mother. 11.  I have discussed with staff that should appear to be at more increased risk of self-harm that they have the availability of placing him on close observation  or even one-to-one if necessary. 12.  Disposition planning-in progress, much of what we will do will be dependent upon legal processes.  Earney Navy, NP-PMHNP-BC 10/02/2020, 10:51 AM

## 2020-10-02 NOTE — Progress Notes (Signed)
   10/02/20 0623  Vital Signs  Temp (!) 97.4 F (36.3 C)  Temp Source Oral  Pulse Rate 76  Resp 18  BP 100/71  BP Location Left Arm  BP Method Automatic  Patient Position (if appropriate) Standing  Oxygen Therapy  SpO2 100 %   D: Patient denies SI/HI/AVH. Patient denies anxiety and rated depression 2/10. Pt. Quiet and cooperative. A:  Patient took scheduled medicine.  Support and encouragement provided Routine safety checks conducted every 15 minutes. Patient  Informed to notify staff with any concerns.   R: Safety maintained.

## 2020-10-02 NOTE — [facility] Group Notes (Signed)
.  Psychoeducational Group Note  Date: 10/02/2020 Time: 0900-1000    Goal Setting   Purpose of Group: This group helps to provide patients with the steps of setting a goal that is specific, measurable, attainable, realistic and time specific. A discussion on how we keep ourselves stuck with negative self talk.    Participation Level:  Active  Participation Quality:  Appropriate  Affect:  flat  Cognitive:  Appropriate  Insight:  Improving  Engagement in Group:  Engaged just a small amount. Did not voluntary speak, except answered when asked questions.  Additional Comments:  The patient rates his energy at a 4/10.   Dione Housekeeper

## 2020-10-02 NOTE — Progress Notes (Signed)
   10/02/20 2250  COVID-19 Daily Checkoff  Have you had a fever (temp > 37.80C/100F)  in the past 24 hours?  No  If you have had runny nose, nasal congestion, sneezing in the past 24 hours, has it worsened? No  COVID-19 EXPOSURE  Have you traveled outside the state in the past 14 days? No  Have you been in contact with someone with a confirmed diagnosis of COVID-19 or PUI in the past 14 days without wearing appropriate PPE? No  Have you been living in the same home as a person with confirmed diagnosis of COVID-19 or a PUI (household contact)? No  Have you been diagnosed with COVID-19? No

## 2020-10-02 NOTE — Progress Notes (Signed)
Patient standing at the nurse's station coloring. The phone rang. Staff answer phone. The person on the other end of the phone asked for Dominic Bryant,  but did not identify this was a patient. Staff asked who is Dominic Bryant, the patient Dominic Bryant said oh, that's a patient on the 500 hall. The  lady on the phone said she is a patient. Staff explain to [name] that is a HIPP violation. Staff asked [name] he can not be at the nursing station coloring. Can he please leave the nurse's station and go to the hall wall with the bench to color. RN  Notified.

## 2020-10-02 NOTE — Progress Notes (Signed)
   10/02/20 0300  Psych Admission Type (Psych Patients Only)  Admission Status Voluntary  Psychosocial Assessment  Eye Contact Brief;Avoids  Facial Expression Sad;Anxious  Affect Anxious  Speech Soft  Interaction Minimal  Motor Activity Pacing  Appearance/Hygiene Unremarkable  Thought Process  Coherency WDL  Content Blaming self  Delusions None reported or observed  Perception WDL  Hallucination None reported or observed  Judgment Poor  Confusion WDL  Danger to Self  Current suicidal ideation? Denies  Danger to Others  Danger to Others None reported or observed

## 2020-10-02 NOTE — Progress Notes (Signed)
[facility] Group Notes:  (Nursing/MHT/Case Management/Adjunct)  Date:  10/02/2020  Time:  2000  Type of Therapy:  wrap up group  Participation Level:  Active  Participation Quality:  Appropriate, Attentive, Sharing and Supportive  Affect:  Appropriate  Cognitive:  Alert  Insight:  Improving  Engagement in Group:  Engaged  Modes of Intervention:  Clarification, Education and Support  Summary of Progress/Problems: Positive thinking and positive change were discussed.   Dominic Bryant 10/02/2020, 11:12 PM

## 2020-10-03 NOTE — BHH Group Notes (Signed)
Adult Psychoeducational Group Not Date:  10/03/2020 Time:  0900-1045 Group Topic/Focus: PROGRESSIVE RELAXATION. A group where deep breathing is taught and tensing and relaxation muscle groups is used. Imagery is used as well.  Pts are asked to imagine 3 pillars that hold them up when they are not able to hold themselves up.  Participation Level:  Active  Participation Quality:  Appropriate  Affect:  Appropriate  Cognitive:  Oriented  Insight: Improving  Engagement in Group:  Engaged  Modes of Intervention:  Activity, Discussion, Education, and Support  Additional Comments:  Pt rates his energy at 9/10. Pt left the room prior to saring his three pillars   Vira Blanco A

## 2020-10-03 NOTE — Progress Notes (Signed)
[facility] MD Progress Note  10/03/2020 3:11 PM Dominic Bryant  MRN:  [id number] Subjective:  Patient is a 25 year old male with a reported past psychiatric history significant for major depression who presented to the behavioral health urgent care center on 09/27/2020 with suicidal ideation.  Objective: Patient was seen in the assessment office where he was noted calm, cooperative and willingly answered questions. He reported improvement in his mood, denied feeling suicidal and no thoughts of wanting to hurt himself.  He is participating in group and other therapy offered in the unit.  He is also visible in the unit and walks the hall way.  He makes his needs known to staff.  Patient looked down when asked if he knows what is happening with his legal issues.  He immediately looked down and replied-"I will talk about it when I get there"  He did not want to talk about it and what he is expecting to happen.  He is complaint with his medications and he reports good appetite. Principal Problem: <principal problem not specified> Diagnosis: Active Problems:   Severe episode of recurrent major depressive disorder, with psychotic features (HCC)  Total Time spent with patient: 15 minutes  Past Psychiatric History: See admission H&P  Past Medical History:  Past Medical History:  Diagnosis Date  . Depression   . Heart murmur   . Thyroid disease     Past Surgical History:  Procedure Laterality Date  . ESOPHAGOGASTRODUODENOSCOPY (EGD) WITH PROPOFOL N/A 05/12/2015   Procedure: ESOPHAGOGASTRODUODENOSCOPY (EGD) WITH PROPOFOL;  Surgeon: Willis Modena, MD;  Location: [facility];  Service: Endoscopy;  Laterality: N/A;  . EUS N/A 05/12/2015   Procedure: UPPER ENDOSCOPIC ULTRASOUND (EUS) RADIAL;  Surgeon: Willis Modena, MD;  Location: [facility];  Service: Endoscopy;  Laterality: N/A;   Family History:  Family History  Problem Relation Age of Onset  . Crohn's disease Mother   . Asthma Father    Family  Psychiatric  History: See admission H&P Social History:  Social History   Substance and Sexual Activity  Alcohol Use Yes   Comment: social     Social History   Substance and Sexual Activity  Drug Use Not Currently  . Types: Marijuana    Social History   Socioeconomic History  . Marital status: Single    Spouse name: Not on file  . Number of children: Not on file  . Years of education: Not on file  . Highest education level: Not on file  Occupational History  . Not on file  Tobacco Use  . Smoking status: Current Every Day Smoker    Packs/day: 1.00    Types: Cigarettes  . Smokeless tobacco: Never Used  Substance and Sexual Activity  . Alcohol use: Yes    Comment: social  . Drug use: Not Currently    Types: Marijuana  . Sexual activity: Not Currently    Partners: Female  Other Topics Concern  . Not on file  Social History Narrative  . Not on file   Social Determinants of Health   Financial Resource Strain: Not on file  Food Insecurity: Not on file  Transportation Needs: Not on file  Physical Activity: Not on file  Stress: Not on file  Social Connections: Not on file   Additional Social History:                         Sleep: Good  Appetite:  Fair  Current Medications: Current Facility-Administered Medications  Medication Dose Route Frequency Provider Last Rate Last Admin  . acetaminophen (TYLENOL) tablet 650 mg  650 mg Oral Q6H PRN Antonieta Pert, MD      . alum & mag hydroxide-simeth (MAALOX/MYLANTA) 200-200-20 MG/5ML suspension 30 mL  30 mL Oral Q4H PRN Antonieta Pert, MD      . diphenhydrAMINE (BENADRYL) capsule 50 mg  50 mg Oral Q6H PRN Nwoko, Uchenna E, PA       Or  . diphenhydrAMINE (BENADRYL) injection 50 mg  50 mg Intramuscular Q6H PRN Nwoko, Uchenna E, PA       Or  . haloperidol lactate (HALDOL) injection 5 mg  5 mg Intramuscular Q6H PRN Nwoko, Uchenna E, PA       Or  . LORazepam (ATIVAN) injection 2 mg  2 mg Intramuscular  Q6H PRN Nwoko, Uchenna E, PA      . folic acid (FOLVITE) tablet 1 mg  1 mg Oral Daily Antonieta Pert, MD   1 mg at 10/03/20 0900  . hydrOXYzine (ATARAX/VISTARIL) tablet 25 mg  25 mg Oral TID PRN Antonieta Pert, MD   25 mg at 10/01/20 2052  . levothyroxine (SYNTHROID) tablet 25 mcg  25 mcg Oral Q0600 Antonieta Pert, MD   25 mcg at 10/03/20 [id number]  . LORazepam (ATIVAN) tablet 1 mg  1 mg Oral Q6H PRN Antonieta Pert, MD      . magnesium hydroxide (MILK OF MAGNESIA) suspension 30 mL  30 mL Oral Daily PRN Antonieta Pert, MD      . mirtazapine (REMERON) tablet 15 mg  15 mg Oral QHS Antonieta Pert, MD   15 mg at 10/02/20 2107  . QUEtiapine (SEROQUEL XR) 24 hr tablet 150 mg  150 mg Oral QHS Antonieta Pert, MD   150 mg at 10/02/20 2107  . sertraline (ZOLOFT) tablet 150 mg  150 mg Oral Daily Antonieta Pert, MD   150 mg at 10/03/20 0900  . thiamine tablet 100 mg  100 mg Oral Daily Antonieta Pert, MD   100 mg at 10/03/20 0900  . traZODone (DESYREL) tablet 50 mg  50 mg Oral QHS PRN Antonieta Pert, MD   50 mg at 10/01/20 2052    Lab Results: No results found for this or any previous visit (from the past 48 hour(s)).  Blood Alcohol level:  No results found for: [facility]  Metabolic Disorder Labs: Lab Results  Component Value Date   HGBA1C 4.6 07/01/2020   No results found for: PROLACTIN Lab Results  Component Value Date   CHOL 189 07/01/2020   TRIG 91.0 07/01/2020   HDL 40.50 07/01/2020   CHOLHDL 5 07/01/2020   VLDL 18.2 07/01/2020   LDLCALC 131 (H) 07/01/2020    Physical Findings: AIMS: Facial and Oral Movements Muscles of Facial Expression: None, normal Lips and Perioral Area: None, normal Jaw: None, normal Tongue: None, normal,Extremity Movements Upper (arms, wrists, hands, fingers): None, normal Lower (legs, knees, ankles, toes): None, normal, Trunk Movements Neck, shoulders, hips: None, normal, Overall Severity Severity of abnormal movements (highest  score from questions above): None, normal Incapacitation due to abnormal movements: None, normal Patient's awareness of abnormal movements (rate only patient's report): No Awareness, Dental Status Current problems with teeth and/or dentures?: No Does patient usually wear dentures?: No  CIWA:    COWS:     Musculoskeletal: Strength & Muscle Tone: within normal limits Gait & Station: normal Patient leans: N/A  Psychiatric Specialty Exam:  Presentation  General Appearance: Fairly Groomed  Eye Contact:Fair  Speech:Normal Rate  Speech Volume:Decreased  Handedness:Right   Mood and Affect  Mood:Depressed  Affect:Congruent   Thought Process  Thought Processes:Coherent  Descriptions of Associations:Intact  Orientation:Full (Time, Place and Person)  Thought Content:Logical  History of Schizophrenia/Schizoaffective disorder:No  Duration of Psychotic Symptoms:Greater than six months  Hallucinations:No data recorded  Ideas of Reference:None  Suicidal Thoughts:No data recorded  Homicidal Thoughts:No data recorded   Sensorium  Memory:Immediate Good; Recent Good; Remote Good  Judgment:Fair  Insight:Fair   Executive Functions  Concentration:Good  Attention Span:Good  Recall:Good  Fund of Knowledge:Good  Language:Good   Psychomotor Activity  Psychomotor Activity:No data recorded   Assets  Assets:Communication Skills; Desire for Improvement; Resilience; Social Support; Housing   Sleep  Sleep:No data recorded    Physical Exam: Physical Exam Vitals and nursing note reviewed.  Constitutional:      Appearance: Normal appearance.  HENT:     Head: Normocephalic and atraumatic.  Pulmonary:     Effort: Pulmonary effort is normal.  Neurological:     General: No focal deficit present.     Mental Status: He is alert and oriented to person, place, and time.    ROS Blood pressure 123/66, pulse 87, temperature 97.7 F (36.5 C), temperature source  Oral, resp. rate 16, SpO2 100 %. There is no height or weight on file to calculate BMI.   Treatment Plan Summary: Daily contact with patient to assess and evaluate symptoms and progress in treatment, Medication management and Plan : Patient is seen and examined.  Patient is a 25 year old male with the above-stated past psychiatric history who is seen in follow-up.   Diagnosis: 1. Major depression, recurrent, severe without psychotic features 2. Posttraumatic stress disorder 3.  Hypothyroidism  Pertinent findings on examination today: 1.  Patient was calm, cooperative and engaged in meaningful conversation regarding his care and discharge plan-engage in IOP to continue treatment. 2.  Patient denied feeling suicidal or having thoughts of wanting to hurt himself. 3.  Patient was hesitance to discuss his legal issues today.  Plan: 1. Continue folic acid 1 mg p.o. daily for nutritional supplementation. 2. Continue hydroxyzine 25 mg p.o. 3 times daily as needed anxiety. 3. Continue levothyroxine 25 mcg p.o. daily for hypothyroidism. 4. Continue lorazepam 1 mg p.o. every 6 hours as needed a CIWA greater than 10. 5. Continue mirtazapine 15 mg p.o. nightly for mood, and sleep. 6. Continue Seroquel extended release 150 mg p.o. nightly for mood stability and sleep. 7. Continue Sertraline  150 mg p.o. daily for mood and anxiety. 8. Continue thiamine 100 mg p.o. daily for nutritional supplementation. 9. Continue trazodone 50 mg p.o. nightly as needed insomnia. 10.  We will provide him with an area of privacy to discuss the current issues with regard to legal proceedings with his mother. 11.  I have discussed with staff that should appear to be at more increased risk of self-harm that they have the availability of placing him on close observation or even one-to-one if necessary. 12.  Disposition planning-in progress, much of what we will do will be dependent upon legal processes.  Possible  discharge tomorrow.  Earney Navy, NP-PMHNP-BC 10/03/2020, 3:11 PM

## 2020-10-03 NOTE — Progress Notes (Signed)
D:  Patient denied SI and HI, contracts for safety.  Denied A/V hallucinations.   A:  Medications administered per MD orders.  Emotional support and encouragement given patient. R:  Safety maintained with 15 minute checks.  

## 2020-10-03 NOTE — Progress Notes (Signed)
Adult Psychoeducational Group Note  Date:  10/03/2020 Time:  8:34 PM  Group Topic/Focus:  Wrap-Up Group:   The focus of this group is to help patients review their daily goal of treatment and discuss progress on daily workbooks.  Participation Level:  Active  Participation Quality:  Appropriate  Affect:  Appropriate  Cognitive:  Appropriate  Insight: Appropriate  Engagement in Group:  Engaged  Modes of Intervention:  Discussion  Additional Comments:  Patient said his day was a 7. His goal for today was to sleep talk to Child psychotherapist. He achieved his goal for today. His coping skills is music.  Charna Busman Long 10/03/2020, 8:34 PM

## 2020-10-03 NOTE — [facility] Group Notes (Signed)
Psychoeducational Group Note  Date: 10-03-20 Time:  1300  Group Topic/Focus:  Making Healthy Choices:   The focus of this group is to help patients identify negative/unhealthy choices they were using prior to admission and identify positive/healthier coping strategies to replace them upon discharge.In this group, patients started asking about the brain and how the brain works with and how the chemicals work for those who use substances, the pros and cons of saboxone.  Participation Level:  Active  Participation Quality:  Appropriate  Affect:  Appropriate  Cognitive:  Oriented  Insight:  Improving  Engagement in Group:  Engaged  Additional Comments Pt rates her energy at a 10/10. Attended the group and partisispated,.Sharee Pimple, Delorse Limber

## 2020-10-03 NOTE — Progress Notes (Signed)
   10/03/20 2245  COVID-19 Daily Checkoff  Have you had a fever (temp > 37.80C/100F)  in the past 24 hours?  No  If you have had runny nose, nasal congestion, sneezing in the past 24 hours, has it worsened? No  COVID-19 EXPOSURE  Have you traveled outside the state in the past 14 days? No  Have you been in contact with someone with a confirmed diagnosis of COVID-19 or PUI in the past 14 days without wearing appropriate PPE? No  Have you been living in the same home as a person with confirmed diagnosis of COVID-19 or a PUI (household contact)? No  Have you been diagnosed with COVID-19? No

## 2020-10-04 DIAGNOSIS — F333 Major depressive disorder, recurrent, severe with psychotic symptoms: Principal | ICD-10-CM

## 2020-10-04 MED ORDER — HYDROXYZINE HCL 25 MG PO TABS: 25.0000 mg | ORAL_TABLET | Freq: Three times a day (TID) | ORAL | 0 refills | Status: DC | PRN

## 2020-10-04 MED ORDER — SERTRALINE HCL 100 MG PO TABS: 150.0000 mg | ORAL_TABLET | Freq: Every day | ORAL | 0 refills | Status: DC

## 2020-10-04 MED ORDER — MIRTAZAPINE 15 MG PO TABS: 15.0000 mg | ORAL_TABLET | Freq: Every day | ORAL | 0 refills | Status: DC

## 2020-10-04 MED ORDER — QUETIAPINE FUMARATE ER 150 MG PO TB24: 150.0000 mg | ORAL_TABLET | Freq: Every day | ORAL | 0 refills | Status: DC

## 2020-10-04 MED ORDER — LEVOTHYROXINE SODIUM 25 MCG PO TABS: 25.0000 ug | ORAL_TABLET | Freq: Every day | ORAL | 0 refills | Status: DC
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
## 2020-10-04 NOTE — Tx Team (Signed)
Interdisciplinary Treatment and Diagnostic Plan Update  10/04/2020 Time of Session: 9:00am  Dominic Bryant MRN: [id number]  Principal Diagnosis: <principal problem not specified>  Secondary Diagnoses: Active Problems:   Severe episode of recurrent major depressive disorder, with psychotic features (HCC)   Current Medications:  Current Facility-Administered Medications  Medication Dose Route Frequency Provider Last Rate Last Admin  . acetaminophen (TYLENOL) tablet 650 mg  650 mg Oral Q6H PRN Antonieta Pert, MD      . alum & mag hydroxide-simeth (MAALOX/MYLANTA) 200-200-20 MG/5ML suspension 30 mL  30 mL Oral Q4H PRN Antonieta Pert, MD      . diphenhydrAMINE (BENADRYL) capsule 50 mg  50 mg Oral Q6H PRN Nwoko, Uchenna E, PA       Or  . diphenhydrAMINE (BENADRYL) injection 50 mg  50 mg Intramuscular Q6H PRN Nwoko, Uchenna E, PA       Or  . haloperidol lactate (HALDOL) injection 5 mg  5 mg Intramuscular Q6H PRN Nwoko, Uchenna E, PA       Or  . LORazepam (ATIVAN) injection 2 mg  2 mg Intramuscular Q6H PRN Nwoko, Uchenna E, PA      . folic acid (FOLVITE) tablet 1 mg  1 mg Oral Daily Antonieta Pert, MD   1 mg at 10/04/20 0759  . hydrOXYzine (ATARAX/VISTARIL) tablet 25 mg  25 mg Oral TID PRN Antonieta Pert, MD   25 mg at 10/04/20 0800  . levothyroxine (SYNTHROID) tablet 25 mcg  25 mcg Oral Q0600 Antonieta Pert, MD   25 mcg at 10/04/20 [phone]  . LORazepam (ATIVAN) tablet 1 mg  1 mg Oral Q6H PRN Antonieta Pert, MD      . magnesium hydroxide (MILK OF MAGNESIA) suspension 30 mL  30 mL Oral Daily PRN Antonieta Pert, MD      . mirtazapine (REMERON) tablet 15 mg  15 mg Oral QHS Antonieta Pert, MD   15 mg at 10/03/20 2128  . QUEtiapine (SEROQUEL XR) 24 hr tablet 150 mg  150 mg Oral QHS Antonieta Pert, MD   150 mg at 10/03/20 2128  . sertraline (ZOLOFT) tablet 150 mg  150 mg Oral Daily Antonieta Pert, MD   150 mg at 10/04/20 0759  . thiamine tablet 100 mg  100 mg  Oral Daily Antonieta Pert, MD   100 mg at 10/04/20 0759  . traZODone (DESYREL) tablet 50 mg  50 mg Oral QHS PRN Antonieta Pert, MD   50 mg at 10/01/20 2052   PTA Medications: Medications Prior to Admission  Medication Sig Dispense Refill Last Dose  . levothyroxine (SYNTHROID) 50 MCG tablet Take 1 tablet (50 mcg total) by mouth daily at 6 (six) AM.     . QUEtiapine (SEROQUEL XR) 150 MG 24 hr tablet Take 1 tablet (150 mg total) by mouth at bedtime. 90 tablet      Patient Stressors:    Patient Strengths:    Treatment Modalities: Medication Management, Group therapy, Case management,  1 to 1 session with clinician, Psychoeducation, Recreational therapy.   Physician Treatment Plan for Primary Diagnosis: <principal problem not specified> Long Term Goal(s): Improvement in symptoms so as ready for discharge Improvement in symptoms so as ready for discharge   Short Term Goals: Ability to identify changes in lifestyle to reduce recurrence of condition will improve Ability to verbalize feelings will improve Ability to disclose and discuss suicidal ideas Ability to demonstrate self-control will improve Ability to  identify and develop effective coping behaviors will improve Ability to maintain clinical measurements within normal limits will improve Compliance with prescribed medications will improve Ability to identify triggers associated with substance abuse/mental health issues will improve Ability to identify changes in lifestyle to reduce recurrence of condition will improve Ability to verbalize feelings will improve Ability to disclose and discuss suicidal ideas Ability to demonstrate self-control will improve Ability to identify and develop effective coping behaviors will improve Ability to maintain clinical measurements within normal limits will improve Compliance with prescribed medications will improve Ability to identify triggers associated with substance abuse/mental health  issues will improve  Medication Management: Evaluate patient's response, side effects, and tolerance of medication regimen.  Therapeutic Interventions: 1 to 1 sessions, Unit Group sessions and Medication administration.  Evaluation of Outcomes: Adequate for Discharge  Physician Treatment Plan for Secondary Diagnosis: Active Problems:   Severe episode of recurrent major depressive disorder, with psychotic features (HCC)  Long Term Goal(s): Improvement in symptoms so as ready for discharge Improvement in symptoms so as ready for discharge   Short Term Goals: Ability to identify changes in lifestyle to reduce recurrence of condition will improve Ability to verbalize feelings will improve Ability to disclose and discuss suicidal ideas Ability to demonstrate self-control will improve Ability to identify and develop effective coping behaviors will improve Ability to maintain clinical measurements within normal limits will improve Compliance with prescribed medications will improve Ability to identify triggers associated with substance abuse/mental health issues will improve Ability to identify changes in lifestyle to reduce recurrence of condition will improve Ability to verbalize feelings will improve Ability to disclose and discuss suicidal ideas Ability to demonstrate self-control will improve Ability to identify and develop effective coping behaviors will improve Ability to maintain clinical measurements within normal limits will improve Compliance with prescribed medications will improve Ability to identify triggers associated with substance abuse/mental health issues will improve     Medication Management: Evaluate patient's response, side effects, and tolerance of medication regimen.  Therapeutic Interventions: 1 to 1 sessions, Unit Group sessions and Medication administration.  Evaluation of Outcomes: Adequate for Discharge   RN Treatment Plan for Primary Diagnosis: <principal  problem not specified> Long Term Goal(s): Knowledge of disease and therapeutic regimen to maintain health will improve  Short Term Goals: Ability to remain free from injury will improve, Ability to participate in decision making will improve, Ability to verbalize feelings will improve, Ability to disclose and discuss suicidal ideas and Ability to identify and develop effective coping behaviors will improve  Medication Management: RN will administer medications as ordered by provider, will assess and evaluate patient's response and provide education to patient for prescribed medication. RN will report any adverse and/or side effects to prescribing provider.  Therapeutic Interventions: 1 on 1 counseling sessions, Psychoeducation, Medication administration, Evaluate responses to treatment, Monitor vital signs and CBGs as ordered, Perform/monitor CIWA, COWS, AIMS and Fall Risk screenings as ordered, Perform wound care treatments as ordered.  Evaluation of Outcomes: Adequate for Discharge   LCSW Treatment Plan for Primary Diagnosis: <principal problem not specified> Long Term Goal(s): Safe transition to appropriate next level of care at discharge, Engage patient in therapeutic group addressing interpersonal concerns.  Short Term Goals: Engage patient in aftercare planning with referrals and resources, Increase social support, Increase emotional regulation, Facilitate acceptance of mental health diagnosis and concerns, Identify triggers associated with mental health/substance abuse issues and Increase skills for wellness and recovery  Therapeutic Interventions: Assess for all discharge needs, 1 to  1 time with Child psychotherapist, Explore available resources and support systems, Assess for adequacy in community support network, Educate family and significant other(s) on suicide prevention, Complete Psychosocial Assessment, Interpersonal group therapy.  Evaluation of Outcomes: Adequate for  Discharge   Progress in Treatment: Attending groups: Yes. Participating in groups: Yes. Taking medication as prescribed: Yes. Toleration medication: Yes. Family/Significant other contact made: Yes, individual(s) contacted:  mother Patient understands diagnosis: Yes. and No. Discussing patient identified problems/goals with staff: Yes. Medical problems stabilized or resolved: Yes. Denies suicidal/homicidal ideation: Yes. Issues/concerns per patient self-inventory: No.   New problem(s) identified: No, Describe:  None   New Short Term/Long Term Goal(s): medication stabilization, elimination of SI thoughts, development of comprehensive mental wellness plan.   Patient Goals: Patient did not attend   Discharge Plan or Barriers: Patient recently admitted. CSW will continue to follow and assess for appropriate referrals and possible discharge planning.   Reason for Continuation of Hospitalization: Depression Medication stabilization Suicidal ideation  Estimated Length of Stay: 3 to 5 days   Attendees: Patient: Did not attend  10/04/2020   Physician: Landry Mellow, MD 10/04/2020   Nursing:  10/04/2020   RN Care Manager: 10/04/2020   Social Worker: Melba Coon, LCSWA 10/04/2020   Recreational Therapist:  10/04/2020   Other:  10/04/2020   Other:  10/04/2020   Other: 10/04/2020     Scribe for Treatment Team: Felizardo Hoffmann, LCSWA 10/04/2020 11:25 AM

## 2020-10-04 NOTE — [facility] Counselor (Signed)
CSW contact [employer] [phone] and was information that the investigation is still ongoing and no information could be provided at this time.   Fredirick Lathe, LCSWA Clinicial Social Worker [employer]

## 2020-10-04 NOTE — Progress Notes (Signed)
Recreation Therapy Notes  Date: 4.11.22 Time: 0930 Location: [address] Dayroom  Group Topic: Stress Management   Goal Area(s) Addresses:  Patient will actively participate in stress management techniques presented during session.  Patient will successfully identify benefit of practicing stress management post d/c.   Behavioral Response: Appropriate  Intervention: Guided exercise with ambient sound and script  Activity :Guided Imagery  LRT read a script that focused envisioning being in a meadow in the summer and enjoying the sights, sounds and feeling of being outdoors.  Patients were to listen and follow along as script was read to fully engage in activity.   Education:  Stress Management, Discharge Planning.   Education Outcome: Acknowledges education  Clinical Observations/Feedback: Patient attended and participated in activity.   Caroll Rancher, LRT/CTRS         Lillia Abed, Xavius Spadafore A 10/04/2020 10:50 AM

## 2020-10-04 NOTE — Progress Notes (Signed)
   10/04/20 0645  Vital Signs  Pulse Rate 82  Pulse Rate Source Monitor  BP 104/68  BP Location Right Arm  BP Method Automatic  Patient Position (if appropriate) Standing  Oxygen Therapy  SpO2 100 %   Discharge Note:  Patient denies SI/HI AVH at this time. Discharge instructions, AVS, prescriptions and transition record gone over with patient. Patient agrees to comply with medication management, follow-up visit, and outpatient therapy. Patient belongings returned to patient. Patient questions and concerns addressed and answered.  Patient ambulatory off unit.  Patient discharged to home with mom.

## 2020-10-04 NOTE — [facility] Suicide Risk Assessment (Signed)
[facility] Discharge Suicide Risk Assessment   Principal Problem: <principal problem not specified> Discharge Diagnoses: Active Problems:   Severe episode of recurrent major depressive disorder, with psychotic features (HCC)   Total Time spent with patient: 20 minutes  Musculoskeletal: Strength & Muscle Tone: within normal limits Gait & Station: normal Patient leans: N/A  Psychiatric Specialty Exam: Review of Systems  All other systems reviewed and are negative.   Blood pressure 104/74, pulse 71, temperature (!) 97.5 F (36.4 C), temperature source Oral, resp. rate 16, SpO2 100 %.There is no height or weight on file to calculate BMI.  General Appearance: Casual  Eye Contact::  Good  Speech:  Normal Rate409  Volume:  Normal  Mood:  Anxious  Affect:  Congruent  Thought Process:  Coherent and Descriptions of Associations: Intact  Orientation:  Full (Time, Place, and Person)  Thought Content:  Logical  Suicidal Thoughts:  No  Homicidal Thoughts:  No  Memory:  Immediate;   Good Recent;   Good Remote;   Good  Judgement:  Intact  Insight:  Fair  Psychomotor Activity:  Normal  Concentration:  Good  Recall:  Good  Fund of Knowledge:Good  Language: Good  Akathisia:  Negative  Handed:  Right  AIMS (if indicated):     Assets:  Desire for Improvement Housing Resilience Social Support  Sleep:  Number of Hours: 6.75  Cognition: WNL  ADL's:  Intact   Mental Status Per Nursing Assessment::   On Admission:  Suicidal ideation indicated by patient  Demographic Factors:  Male, Low socioeconomic status and Unemployed  Loss Factors: Legal issues  Historical Factors: Impulsivity  Risk Reduction Factors:   Living with another person, especially a relative and Positive social support  Continued Clinical Symptoms:  Bipolar Disorder:   Mixed State  Cognitive Features That Contribute To Risk:  None    Suicide Risk:  Mild:  Suicidal ideation of limited frequency, intensity,  duration, and specificity.  There are no identifiable plans, no associated intent, mild dysphoria and related symptoms, good self-control (both objective and subjective assessment), few other risk factors, and identifiable protective factors, including available and accessible social support.   Follow-up Information    BEHAVIORAL HEALTH PARTIAL HOSPITALIZATION PROGRAM Follow up on 10/11/2020.   Specialty: Behavioral Health Why: You have an appointment to start the partial hospitalization program for therapy and medication management services on 10/11/20 at 2:00 pm.  This will be a Virtual appointment. Contact information: [address] [id number] mc [place] [address] [phone]              Plan Of Care/Follow-up recommendations:  Activity:  ad lib  Antonieta Pert, MD 10/04/2020, 9:27 AM

## 2020-10-04 NOTE — [facility] Suicide Risk Assessment (Signed)
[facility] INPATIENT:  Family/Significant Other Suicide Prevention Education  Suicide Prevention Education:  Education Completed; Lambert Mody (mother) [phone],  (name of family member/significant other) has been identified by the patient as the family member/significant other with whom the patient will be residing, and identified as the person(s) who will aid the patient in the event of a mental health crisis (suicidal ideations/suicide attempt).  With written consent from the patient, the family member/significant other has been provided the following suicide prevention education, prior to the and/or following the discharge of the patient.  The suicide prevention education provided includes the following:  Suicide risk factors  Suicide prevention and interventions  National Suicide Hotline telephone number  [facility] assessment telephone number  [facility] Emergency Assistance 911  [facility] and/or Residential Mobile Crisis Unit telephone number  Request made of family/significant other to:  Remove weapons (e.g., guns, rifles, knives), all items previously/currently identified as safety concern.    Remove drugs/medications (over-the-counter, prescriptions, illicit drugs), all items previously/currently identified as a safety concern.  The family member/significant other verbalizes understanding of the suicide prevention education information provided.  The family member/significant other agrees to remove the items of safety concern listed above.  When asked what caused pt to present to the hospital, Ms. Perrin Maltese stated, "He had a nervous breakdown from things that shouldn't have happened and I told him  To leave and I guess that hurt hum. He also had some issues going on with his girlfriend at the time too.". "I want him to stay on his medications.". No safety concerns expressed. No weapons in the home. Ms. Perrin Maltese states she will pick pt up from [facility] at 1:30pm and he will  return to live at his mother's home.   Dominic Bryant 10/04/2020, 9:37 AM

## 2020-10-11 ENCOUNTER — Other Ambulatory Visit (HOSPITAL_COMMUNITY): Payer: BC Managed Care – PPO | Attending: Psychiatry | Admitting: Licensed Clinical Social Worker

## 2020-10-11 ENCOUNTER — Other Ambulatory Visit: Payer: Self-pay

## 2020-10-11 DIAGNOSIS — F17210 Nicotine dependence, cigarettes, uncomplicated: Secondary | ICD-10-CM | POA: Insufficient documentation

## 2020-10-11 DIAGNOSIS — Z79899 Other long term (current) drug therapy: Secondary | ICD-10-CM | POA: Insufficient documentation

## 2020-10-11 DIAGNOSIS — Z9151 Personal history of suicidal behavior: Secondary | ICD-10-CM | POA: Insufficient documentation

## 2020-10-11 DIAGNOSIS — F332 Major depressive disorder, recurrent severe without psychotic features: Secondary | ICD-10-CM

## 2020-10-11 DIAGNOSIS — F333 Major depressive disorder, recurrent, severe with psychotic symptoms: Secondary | ICD-10-CM | POA: Insufficient documentation

## 2020-10-11 DIAGNOSIS — R4589 Other symptoms and signs involving emotional state: Secondary | ICD-10-CM | POA: Insufficient documentation

## 2020-10-11 DIAGNOSIS — Z62810 Personal history of physical and sexual abuse in childhood: Secondary | ICD-10-CM | POA: Insufficient documentation

## 2020-10-11 DIAGNOSIS — R41844 Frontal lobe and executive function deficit: Secondary | ICD-10-CM | POA: Insufficient documentation

## 2020-10-12 ENCOUNTER — Other Ambulatory Visit (HOSPITAL_COMMUNITY): Payer: BC Managed Care – PPO

## 2020-10-12 ENCOUNTER — Other Ambulatory Visit: Payer: Self-pay

## 2020-10-13 ENCOUNTER — Encounter (HOSPITAL_COMMUNITY): Payer: Self-pay

## 2020-10-13 ENCOUNTER — Other Ambulatory Visit (HOSPITAL_COMMUNITY): Payer: BC Managed Care – PPO | Admitting: Licensed Clinical Social Worker

## 2020-10-13 ENCOUNTER — Other Ambulatory Visit (HOSPITAL_COMMUNITY): Payer: BC Managed Care – PPO | Admitting: Occupational Therapy

## 2020-10-13 ENCOUNTER — Other Ambulatory Visit: Payer: Self-pay

## 2020-10-13 ENCOUNTER — Encounter (HOSPITAL_COMMUNITY): Payer: Self-pay | Admitting: Family

## 2020-10-13 DIAGNOSIS — R4589 Other symptoms and signs involving emotional state: Secondary | ICD-10-CM

## 2020-10-13 DIAGNOSIS — F17210 Nicotine dependence, cigarettes, uncomplicated: Secondary | ICD-10-CM | POA: Diagnosis not present

## 2020-10-13 DIAGNOSIS — Z9151 Personal history of suicidal behavior: Secondary | ICD-10-CM | POA: Diagnosis not present

## 2020-10-13 DIAGNOSIS — Z79899 Other long term (current) drug therapy: Secondary | ICD-10-CM | POA: Diagnosis not present

## 2020-10-13 DIAGNOSIS — F333 Major depressive disorder, recurrent, severe with psychotic symptoms: Secondary | ICD-10-CM

## 2020-10-13 DIAGNOSIS — R41844 Frontal lobe and executive function deficit: Secondary | ICD-10-CM

## 2020-10-13 DIAGNOSIS — Z62810 Personal history of physical and sexual abuse in childhood: Secondary | ICD-10-CM | POA: Diagnosis not present
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
## 2020-10-13 NOTE — Progress Notes (Signed)
Spoke with patient via webex video call, used 2 identifiers to correctly identify patient. This is his first time in [facility], was recommended after going to [facility] for feeling suicidal with a plan to step into traffic or jump off a bridge. His main stressor is financial. 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 He had a family fight over the weekend that caused him to feel suicidal. Denies SI/HI or AV hallucinations today. On scale 1-10 as 10 being worst he rates depression at 3 and anxiety at 3. PHQ9=13. Groups are going well so far. Patient was playing a video game during the call with this Clinical research associate stating he has to multitask to pay attention. No issues or complaints.

## 2020-10-14 ENCOUNTER — Other Ambulatory Visit (HOSPITAL_COMMUNITY): Payer: BC Managed Care – PPO | Admitting: Licensed Clinical Social Worker

## 2020-10-14 ENCOUNTER — Other Ambulatory Visit (HOSPITAL_COMMUNITY): Payer: BC Managed Care – PPO

## 2020-10-14 ENCOUNTER — Other Ambulatory Visit: Payer: Self-pay

## 2020-10-14 DIAGNOSIS — F332 Major depressive disorder, recurrent severe without psychotic features: Secondary | ICD-10-CM

## 2020-10-15 ENCOUNTER — Telehealth (HOSPITAL_COMMUNITY): Payer: Self-pay | Admitting: Licensed Clinical Social Worker

## 2020-10-15 ENCOUNTER — Other Ambulatory Visit (HOSPITAL_COMMUNITY): Payer: BC Managed Care – PPO

## 2020-10-15 ENCOUNTER — Other Ambulatory Visit: Payer: Self-pay
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
## 2020-10-18 ENCOUNTER — Other Ambulatory Visit (HOSPITAL_COMMUNITY): Payer: BC Managed Care – PPO | Admitting: Occupational Therapy

## 2020-10-18 ENCOUNTER — Other Ambulatory Visit: Payer: Self-pay

## 2020-10-18 ENCOUNTER — Encounter (HOSPITAL_COMMUNITY): Payer: Self-pay

## 2020-10-18 ENCOUNTER — Other Ambulatory Visit (HOSPITAL_COMMUNITY): Payer: BC Managed Care – PPO | Admitting: Licensed Clinical Social Worker

## 2020-10-18 DIAGNOSIS — Z9151 Personal history of suicidal behavior: Secondary | ICD-10-CM | POA: Diagnosis not present

## 2020-10-18 DIAGNOSIS — R4589 Other symptoms and signs involving emotional state: Secondary | ICD-10-CM | POA: Diagnosis not present

## 2020-10-18 DIAGNOSIS — F333 Major depressive disorder, recurrent, severe with psychotic symptoms: Secondary | ICD-10-CM

## 2020-10-18 DIAGNOSIS — R41844 Frontal lobe and executive function deficit: Secondary | ICD-10-CM

## 2020-10-18 DIAGNOSIS — F332 Major depressive disorder, recurrent severe without psychotic features: Secondary | ICD-10-CM

## 2020-10-18 DIAGNOSIS — Z79899 Other long term (current) drug therapy: Secondary | ICD-10-CM | POA: Diagnosis not present

## 2020-10-18 DIAGNOSIS — Z62810 Personal history of physical and sexual abuse in childhood: Secondary | ICD-10-CM | POA: Diagnosis not present

## 2020-10-18 DIAGNOSIS — F17210 Nicotine dependence, cigarettes, uncomplicated: Secondary | ICD-10-CM | POA: Diagnosis not present

## 2020-10-18 NOTE — Therapy (Addendum)
[facility] [place], [place], [address] Phone: [phone]   Fax:  [phone] Virtual Visit via Video Note  I connected with Dominic Bryant on 10/18/20 at  11:00 AM EDT by a video enabled telemedicine application and verified that I am speaking with the correct person using two identifiers.  Location: Patient: Patient Home Provider: Clinic Office   I discussed the limitations of evaluation and management by telemedicine and the availability of in person appointments. The patient expressed understanding and agreed to proceed.   I discussed the assessment and treatment plan with the patient. The patient was provided an opportunity to ask questions and all were answered. The patient agreed with the plan and demonstrated an understanding of the instructions.   The patient was advised to call back or seek an in-person evaluation if the symptoms worsen or if the condition fails to improve as anticipated.  I provided 60 minutes of non-face-to-face time during this encounter.   Dominic Bryant, OT   Occupational Therapy Treatment  Patient Details  Name: Dominic Bryant MRN: [id number] Date of Birth: 02/26/1996 Referring Provider (OT): Dominic Bryant   Encounter Date: 10/18/2020   OT End of Session - 10/18/20 1301    Visit Number 2    Number of Visits 20    Date for OT Re-Evaluation 11/10/20    Authorization Type BC[employer]    OT Start Time 1100    OT Stop Time 1200    OT Time Calculation (min) 60 min    Activity Tolerance Other (comment)    Behavior During Therapy --   Did not participate          Past Medical History:  Diagnosis Date  . Depression   . Heart murmur   . Thyroid disease     Past Surgical History:  Procedure Laterality Date  . ESOPHAGOGASTRODUODENOSCOPY (EGD) WITH PROPOFOL N/A 05/12/2015   Procedure: ESOPHAGOGASTRODUODENOSCOPY (EGD) WITH PROPOFOL;  Surgeon: Arta Silence, MD;  Location: [facility]  ENDOSCOPY;  Service: Endoscopy;  Laterality: N/A;  . EUS N/A 05/12/2015   Procedure: UPPER ENDOSCOPIC ULTRASOUND (EUS) RADIAL;  Surgeon: Arta Silence, MD;  Location: [facility] ENDOSCOPY;  Service: Endoscopy;  Laterality: N/A;    There were no vitals filed for this visit.   Subjective Assessment - 10/18/20 1301    Currently in Pain? No/denies    Pain Score 0-No pain           OT Education - 10/18/20 1301    Education Details Educated on different factors that contribute to our ability to manage our time, along with specific time management tips/strategies, including procrastination    Person(s) Educated Patient    Methods Explanation;Handout    Comprehension Verbal cues required            OT Short Term Goals - 10/18/20 1303      OT SHORT TERM GOAL #1   Status On-going      OT SHORT TERM GOAL #2   Status On-going      OT SHORT TERM GOAL #3   Status On-going         Group Session:  S: None noted*  O: Group began with a warm-up ice breaker activity where patients were encouraged to reflect on the scenario "If you had 86,400 dollars dropped into your bank account at midnight and 24 hours to spend it, what you use the money for? The only rule was that any money not used disappeared  after 24 hours." Warm-up activity was used as an Designer, industrial/product and a way to connect that similar to the scenario of money, we do not get time back and there are 86,400 seconds in a day. Warm-up transitioned into today's group focused on topic of Time Management. Group members identified ways in which they currently struggle with managing their time and discussion focused on alternative strategies to recognize how we can be more productive and intentional with our time and managing it appropriately. Group members also discussed specific strategies to overcome procrastination.   A: Dominic Bryant was signed on and present in group for duration, however did not have his camera/microphone on, and did not respond to  moderate verbal cues to engage. Pt remained signed on, however despite multiple prompts to engage, pt did not participate in group discussion. Pt did not identify any concerns or issues with his time management, however again, was non-participatory for entirety of group. Will relay message to treatment team in the AM to develop plan for engagement. Will continue to promote active engagement and participation in OT group sessions within [facility] program.   P: Continue to attend [facility] OT group sessions 5x week for 2 weeks to promote daily structure, social engagement, and opportunities to develop and utilize adaptive strategies to maximize functional performance in preparation for safe transition and integration back into school, work, and the community. Plan to address topic of procrastination in next OT group session.  OCCUPATIONAL THERAPY DISCHARGE SUMMARY  Visits from Start of Care: 2  Current functional level related to goals / functional outcomes: Pt has not met any of his OT goals within the [facility] program. Pt has been inconsistent with his attendance and when present, pt does not engage, despite max verbal cues to do so. Pt was also observed x 1 occasion playing video games during group and despite prompts not to do so, pt refused. Pt has not made progress within the program and will discharge from PHP, effective immediately, with plans to follow up with individual therapist/provider for ongoing treatment and mental health needs. Pt is in agreement at this time and denies any concerns.   Remaining deficits: See above   Education / Equipment: See above   Plan: Patient agrees to discharge.  Patient goals were not met. Patient is being discharged due to being pleased with the current functional level.  ?????        Plan - 10/18/20 1302    Occupational performance deficits (Please refer to evaluation for details): ADL's;IADL's;Rest and Sleep;Education;Work;Leisure;Social Participation    Body  Structure / Function / Physical Skills ADL    Cognitive Skills Attention;Emotional;Energy/Drive;Memory;Learn;Perception;Problem Solve;Safety Awareness;Sequencing;Temperament/Personality;Thought;Understand    Psychosocial Skills Coping Strategies;Environmental  Adaptations;Habits;Interpersonal Interaction;Routines and Behaviors           Patient will benefit from skilled therapeutic intervention in order to improve the following deficits and impairments:   Body Structure / Function / Physical Skills: ADL Cognitive Skills: Attention,Emotional,Energy/Drive,Memory,Learn,Perception,Problem Solve,Safety Awareness,Sequencing,Temperament/Personality,Thought,Understand Psychosocial Skills: Coping Strategies,Environmental  Adaptations,Habits,Interpersonal Interaction,Routines and Behaviors   Visit Diagnosis: Difficulty coping  Frontal lobe and executive function deficit  MDD (major depressive disorder), recurrent, severe, with psychosis ([place])    Problem List Patient Active Problem List   Diagnosis Date Noted  . Severe episode of recurrent major depressive disorder, with psychotic features ([place]) 09/28/2020  . Encounter for general adult medical examination with abnormal findings 07/01/2020  . Morbid obesity due to excess calories ([place]) 07/01/2020  . Major depressive disorder with single episode, in partial remission (  [place]) 07/01/2020  . Acquired hypothyroidism 07/01/2020  . Chronic diarrhea 07/01/2020    10/18/2020  Dominic Bryant, MOT, OTR/L  10/18/2020, 1:03 PM  [place], [place], [address] Phone: [phone]   Fax:  [phone]  Name: Dominic Bryant MRN: [id number] Date of Birth: [birth year]

## 2020-10-19 ENCOUNTER — Other Ambulatory Visit (HOSPITAL_COMMUNITY): Payer: BC Managed Care – PPO

## 2020-10-19 ENCOUNTER — Other Ambulatory Visit: Payer: Self-pay

## 2020-10-19 ENCOUNTER — Encounter (HOSPITAL_COMMUNITY): Payer: Self-pay | Admitting: Family

## 2020-10-19 ENCOUNTER — Other Ambulatory Visit (HOSPITAL_COMMUNITY): Payer: BC Managed Care – PPO | Admitting: Family

## 2020-10-19 DIAGNOSIS — F332 Major depressive disorder, recurrent severe without psychotic features: Secondary | ICD-10-CM

## 2020-10-19 DIAGNOSIS — F333 Major depressive disorder, recurrent, severe with psychotic symptoms: Secondary | ICD-10-CM
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
## 2020-10-20 ENCOUNTER — Other Ambulatory Visit (HOSPITAL_COMMUNITY): Payer: BC Managed Care – PPO

## 2020-10-20 ENCOUNTER — Ambulatory Visit (HOSPITAL_COMMUNITY): Payer: BC Managed Care – PPO

## 2020-10-21 ENCOUNTER — Other Ambulatory Visit (HOSPITAL_COMMUNITY): Payer: BC Managed Care – PPO

## 2020-10-21 ENCOUNTER — Ambulatory Visit (HOSPITAL_COMMUNITY): Payer: BC Managed Care – PPO

## 2020-10-22 ENCOUNTER — Ambulatory Visit (HOSPITAL_COMMUNITY): Payer: BC Managed Care – PPO

## 2020-10-22 ENCOUNTER — Other Ambulatory Visit (HOSPITAL_COMMUNITY): Payer: BC Managed Care – PPO

## 2020-10-25 ENCOUNTER — Ambulatory Visit (HOSPITAL_COMMUNITY): Payer: BC Managed Care – PPO

## 2020-10-25 ENCOUNTER — Other Ambulatory Visit (HOSPITAL_COMMUNITY): Payer: BC Managed Care – PPO

## 2020-10-26 ENCOUNTER — Ambulatory Visit (HOSPITAL_COMMUNITY): Payer: BC Managed Care – PPO

## 2020-10-26 ENCOUNTER — Other Ambulatory Visit (HOSPITAL_COMMUNITY): Payer: BC Managed Care – PPO

## 2020-10-27 ENCOUNTER — Other Ambulatory Visit (HOSPITAL_COMMUNITY): Payer: BC Managed Care – PPO

## 2020-10-27 ENCOUNTER — Ambulatory Visit (HOSPITAL_COMMUNITY): Payer: BC Managed Care – PPO

## 2020-10-28 ENCOUNTER — Other Ambulatory Visit (HOSPITAL_COMMUNITY): Payer: BC Managed Care – PPO

## 2020-10-28 ENCOUNTER — Ambulatory Visit (HOSPITAL_COMMUNITY): Payer: BC Managed Care – PPO

## 2020-10-29 ENCOUNTER — Ambulatory Visit (HOSPITAL_COMMUNITY): Payer: BC Managed Care – PPO

## 2020-10-29 ENCOUNTER — Other Ambulatory Visit (HOSPITAL_COMMUNITY): Payer: BC Managed Care – PPO

## 2020-10-31 ENCOUNTER — Other Ambulatory Visit (HOSPITAL_COMMUNITY): Payer: Self-pay | Admitting: Psychiatry

## 2020-11-06 NOTE — Psych (Signed)
Virtual Visit via Video Note  I connected with Dominic Bryant on 10/13/20 at  9:00 AM EDT by a video enabled telemedicine application and verified that I am speaking with the correct person using two identifiers.  Location: Patient: patient home Provider: clinical home office   I discussed the limitations of evaluation and management by telemedicine and the availability of in person appointments. The patient expressed understanding and agreed to proceed.  I discussed the assessment and treatment plan with the patient. The patient was provided an opportunity to ask questions and all were answered. The patient agreed with the plan and demonstrated an understanding of the instructions.   The patient was advised to call back or seek an in-person evaluation if the symptoms worsen or if the condition fails to improve as anticipated.  Cln and pt completed treatment plan and pt reports alignment wit plan. Pt gives verbal consent to treatment and agreement with group commitments.   I provided 10 minutes of non-face-to-face time during this encounter.   Donia Guiles, LCSW

## 2020-11-06 NOTE — Psych (Signed)
Virtual Visit via Video Note  I connected with Dominic Bryant on 10/13/20 at  9:00 AM EDT by a video enabled telemedicine application and verified that I am speaking with the correct person using two identifiers.  Location: Patient: patient home Provider: clinical home office   I discussed the limitations of evaluation and management by telemedicine and the availability of in person appointments. The patient expressed understanding and agreed to proceed.  I discussed the assessment and treatment plan with the patient. The patient was provided an opportunity to ask questions and all were answered. The patient agreed with the plan and demonstrated an understanding of the instructions.   The patient was advised to call back or seek an in-person evaluation if the symptoms worsen or if the condition fails to improve as anticipated.  Pt was provided 240 minutes of non-face-to-face time during this encounter.   Donia Guiles, LCSW    [facility] BH PHP THERAPIST PROGRESS NOTE  Dominic Bryant [id number]  Session Time: 9:00 -10:00  Participation Level: Active  Behavioral Response: CasualAlertEuthymic  Type of Therapy: Group Therapy  Treatment Goals addressed: Coping  Interventions: CBT, DBT, Supportive and Reframing  Summary: Clinician led check-in regarding current stressors and situation.Clinician utilized active listening and empathetic response and validated patient emotions. Clinician facilitated processing group on pertinent issues.   Therapist Response: Dominic Bryant is a 25 y.o. male who presents with depression symptoms.  Patient arrived within time allowed and reports that he is feeling "good." Patient rates hismood at [facility] a scale of 1-10 with 10 being great. Pt appears to be euthymic and laughs incongruently at times. Pt states he drove around most of yesterday and reports he has been in a good mood. Pt denies recent SI/HI. Pt able to process. Pt engaged in discussion.       Session Time: 10:00 - 11:00   Participation Level:Active  Behavioral Response:CasualAlertDepressed  Type of Therapy:GroupTherapy  Treatment Goals addressed: Coping  Interventions:CBT, DBT, Supportive and Reframing  Summary:Cln led discussion on the ways our phones can cause barriers to our mental health. Group discussed struggles they have with their phones and the role it can play in unhealthy thought patterns and behaviors. Group worked to problem solve ways to cut down on negative behaviors.   Therapist Response:Pt engaged in discussion and was able to process.    Session Time: 11:00- 12:00  Participation Level:Active  Behavioral Response:CasualAlertDepressed  Type of Therapy: Group Therapy, OT  Treatment Goals addressed: Coping  Interventions:Supportive, Reframing  Summary:Spiritual Care group  Therapist Response:Pt engaged in session. See chaplain note      Session Time: 12:00 -1:00  Participation Level:Active  Behavioral Response:CasualAlertDepressed  Type of Therapy:Group therapy  Treatment Goals addressed: Coping  Interventions:Psychosocial skills training, Supportive  Summary:12:00 - 12:50:Occupational Therapy group 12:50 -1:00 Clinician led check-out. Clinician assessed for immediate needs, medication compliance and efficacy, and safety concerns  Therapist Response:12:00 - 12:50:Patient engaged in group. See OT note. 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 Pt demonstrates some progress as evidenced byparticipation in first group session.Patient denies SI/HI at the end of group.   Suicidal/Homicidal: Nowithout intent/plan  Plan: Pt will continue in PHP while working to decrease depression symptoms and increase ability to manage symptoms in a healthy manner.   Diagnosis: MDD  (major depressive disorder), recurrent, severe, with psychosis (HCC) [F33.3]    1. MDD (major depressive disorder), recurrent,  severe, with psychosis (HCC)       Donia Guiles, LCSW 11/06/2020

## 2020-11-14 NOTE — Psych (Signed)
Virtual Visit via Video Note  I connected with [name] on 10/18/20 at  9:00 AM EDT by a video enabled telemedicine application and verified that I am speaking with the correct person using two identifiers.  Location: Patient: patient home Provider: clinical home office   I discussed the limitations of evaluation and management by telemedicine and the availability of in person appointments. The patient expressed understanding and agreed to proceed.  I discussed the assessment and treatment plan with the patient. The patient was provided an opportunity to ask questions and all were answered. The patient agreed with the plan and demonstrated an understanding of the instructions.   The patient was advised to call back or seek an in-person evaluation if the symptoms worsen or if the condition fails to improve as anticipated.  Pt was provided 240 minutes of non-face-to-face time during this encounter.   Donia Guiles, LCSW    [facility] BH PHP THERAPIST PROGRESS NOTE  PIO EATHERLY [id number]  Session Time: 9:00 -10:00  Participation Level: Active  Behavioral Response: CasualAlertEuthymic  Type of Therapy: Group Therapy  Treatment Goals addressed: Coping  Interventions: CBT, DBT, Supportive and Reframing  Summary: Clinician led check-in regarding current stressors and situation.Clinician utilized active listening and empathetic response and validated patient emotions. Clinician facilitated processing group on pertinent issues.   Therapist Response: ARLES RUMBOLD is a 25 y.o. male who presents with depression symptoms.  Patient arrived within time allowed and reports that he is feeling "good." Patient rates hismood at [facility] a scale of 1-10 with 10 being great. Pt reports his weekend was "hectic" and his dog got out of the fence and pt spent days looking for him. Pt states his dog is back home now. Pt reports feeling better now, but was anxious all weekend. Pt denies recent  SI/HI. Pt able to process. Pt engaged in discussion.      Session Time: 10:00 - 11:00   Participation Level:Active  Behavioral Response:CasualAlertDepressed  Type of Therapy:GroupTherapy  Treatment Goals addressed: Coping  Interventions:CBT, DBT, Supportive and Reframing  Summary:Cln led discussion on setting boundaries as a way to increase self-care. Group members discussed things that are stumbling blocks to them engaging in self-care and worked to determine what boundary could address that stumbling block. Group worked together to determine how to address the boundary. Cln brought in topics of boundaries, assertiveness, thought challenging, and self-care.   Therapist Response:Pt minimally engaged in discussion.     Session Time: 11:00- 12:00  Participation Level:None  Behavioral Response:CasualAlertDepressed  Type of Therapy: Group Therapy, OT  Treatment Goals addressed: Coping  Interventions:Psychosocial skills training, Supportive  Summary:Occupational Therapy group  Therapist Response:Patient did not engage in group. See OT note.      Session Time: 12:00 -1:00  Participation Level:None  Behavioral Response:CasualAlertDepressed  Type of Therapy:Group therapy  Treatment Goals addressed: Coping  Interventions:CBT; Solution focused; Supportive; Reframing  Summary:12:00 - 12:50: Cln continued topic of DBT distress tolerance skills and the ACCEPTS distraction skill. Group reviewed C-C-E skills and discussed how they can practice them in their every day life.  12:50 -1:00 Clinician led check-out. Clinician assessed for immediate needs, medication compliance and efficacy, and safety concerns  Therapist Response:12:50 - 1:00: Pt did not engage.  12:50 - 1:00: At check-out, patientwas non-responsive.    Suicidal/Homicidal: Nowithout intent/plan  Plan: Pt will continue in PHP while working to decrease  depression symptoms and increase ability to manage symptoms in a healthy manner.   Diagnosis: Severe episode  of recurrent major depressive disorder, without psychotic features (HCC) [F33.2]    1. Severe episode of recurrent major depressive disorder, without psychotic features (HCC)       Donia Guiles, LCSW 11/14/2020

## 2020-11-14 NOTE — Psych (Signed)
Virtual Visit via Video Note  I connected with [name] on 10/14/20 at  9:00 AM EDT by a video enabled telemedicine application and verified that I am speaking with the correct person using two identifiers.  Location: Patient: patient home Provider: clinical home office   I discussed the limitations of evaluation and management by telemedicine and the availability of in person appointments. The patient expressed understanding and agreed to proceed.  I discussed the assessment and treatment plan with the patient. The patient was provided an opportunity to ask questions and all were answered. The patient agreed with the plan and demonstrated an understanding of the instructions.   The patient was advised to call back or seek an in-person evaluation if the symptoms worsen or if the condition fails to improve as anticipated.  Pt was provided 150 minutes of non-face-to-face time during this encounter.   Dominic Guiles, LCSW    [facility] BH PHP THERAPIST PROGRESS NOTE  KOU GUCCIARDO [id number]  Session Time: 9:00 -10:00  Participation Level: Active  Behavioral Response: CasualAlertEuthymic  Type of Therapy: Group Therapy  Treatment Goals addressed: Coping  Interventions: CBT, DBT, Supportive and Reframing  Summary: Clinician led check-in regarding current stressors and situation.Clinician utilized active listening and empathetic response and validated patient emotions. Clinician facilitated processing group on pertinent issues.   Therapist Response: Dominic Bryant is a 25 y.o. male who presents with depression symptoms.  Patient arrived within time allowed and reports that he is feeling "good." Patient rates hismood at [facility] a scale of 1-10 with 10 being great. Pt reports he woke up in a good mood and with "lots" of energy. Pt reports no issues yesterday. Pt denies recent SI/HI. Pt's presentation is in stark contrast to last week and his hospital admission. Pt is unable to  articulate the change.      Session Time: 10:00 - 11:00   Participation Level:Minimal  Behavioral Response:CasualAlertEuthymic  Type of Therapy:GroupTherapy  Treatment Goals addressed: Coping  Interventions:CBT, DBT, Supportive and Reframing  Summary:Cln led discussion on the 5 stages of grief and the way loss affects [place]. Cln encouraged pt to consider grief as a journey to accepting a new future. Cln created space for pt to process grief concerns and validated pt's experiences.   Therapist Response: Pt minimally engaged in discussion.       Session Time: 11:00- 12:00  Participation Level:Minimal  Behavioral Response:CasualAlertEuthymic  Type of Therapy:GroupTherapy  Treatment Goals addressed: Coping  Interventions:CBT, DBT, Supportive and Reframing  Summary:Cln led activity on finding what makes you happy. Cln tasked group members to consider times in which they have been happy in the past, moments in which they have felt less distressed recently, and things that give them any rumblings of joy. Cln worked with pt's to process barriers and to consider one thing for now, not the bigger picture.   Therapist Response:Pt minimally engaged and left group without notice approximately 11:30.     Suicidal/Homicidal: Nowithout intent/plan  Plan: Pt will continue in PHP while working to decrease depression symptoms and increase ability to manage symptoms in a healthy manner.   Diagnosis: Severe episode of recurrent major depressive disorder, without psychotic features (HCC) [F33.2]    1. Severe episode of recurrent major depressive disorder, without psychotic features (HCC)       Dominic Guiles, LCSW 11/14/2020

## 2021-05-08 ENCOUNTER — Encounter (HOSPITAL_COMMUNITY): Payer: Self-pay

## 2021-05-08 ENCOUNTER — Other Ambulatory Visit: Payer: Self-pay

## 2021-05-08 ENCOUNTER — Emergency Department (HOSPITAL_COMMUNITY)
Admission: EM | Admit: 2021-05-08 | Discharge: 2021-05-08 | Disposition: A | Discharge status: Home / Self Care | Payer: BC Managed Care – PPO | Attending: Emergency Medicine | Admitting: Emergency Medicine

## 2021-05-08 DIAGNOSIS — E039 Hypothyroidism, unspecified: Secondary | ICD-10-CM | POA: Insufficient documentation

## 2021-05-08 DIAGNOSIS — Z79899 Other long term (current) drug therapy: Secondary | ICD-10-CM | POA: Diagnosis not present

## 2021-05-08 DIAGNOSIS — S41152A Open bite of left upper arm, initial encounter: Secondary | ICD-10-CM | POA: Diagnosis not present

## 2021-05-08 DIAGNOSIS — Z23 Encounter for immunization: Secondary | ICD-10-CM | POA: Diagnosis not present

## 2021-05-08 DIAGNOSIS — F17210 Nicotine dependence, cigarettes, uncomplicated: Secondary | ICD-10-CM | POA: Diagnosis not present

## 2021-05-08 DIAGNOSIS — W540XXA Bitten by dog, initial encounter: Secondary | ICD-10-CM | POA: Diagnosis not present

## 2021-05-08 DIAGNOSIS — S4992XA Unspecified injury of left shoulder and upper arm, initial encounter: Secondary | ICD-10-CM | POA: Diagnosis not present

## 2021-05-08 DIAGNOSIS — S41132A Puncture wound without foreign body of left upper arm, initial encounter: Secondary | ICD-10-CM | POA: Insufficient documentation

## 2021-05-08 DIAGNOSIS — S51852A Open bite of left forearm, initial encounter: Secondary | ICD-10-CM | POA: Diagnosis not present

## 2021-05-08 MED ORDER — AMOXICILLIN-POT CLAVULANATE 875-125 MG PO TABS: 1.0000 | ORAL_TABLET | Freq: Two times a day (BID) | ORAL | 0 refills | Status: DC

## 2021-05-08 MED ORDER — TETANUS-DIPHTH-ACELL PERTUSSIS 5-2.5-18.5 LF-MCG/0.5 IM SUSY
0.5000 mL | PREFILLED_SYRINGE | Freq: Once | INTRAMUSCULAR | Status: AC
  Administered 2021-05-08: 0.5 mL via INTRAMUSCULAR
  Filled 2021-05-08: qty 0.5

## 2021-05-08 NOTE — ED Provider Notes (Signed)
[facility]-EMERGENCY DEPT Provider Note   CSN: [id number] Arrival date & time: 05/08/21  6384     History Chief Complaint  Patient presents with   Animal Bite    Dominic Bryant is a 25 y.o. male.  The history is provided by the patient.  Animal Bite Contact animal:  Dog Location:  Shoulder/arm Shoulder/arm injury location:  L upper arm Time since incident:  1 day Pain details:    Quality:  Aching and localized   Severity:  Moderate   Timing:  Constant   Progression:  Worsening Incident location:  Outside (was getting his dog when the neighbors dog came out and started fighting with his dog.  He was trying to break it up when the neighbors dog bit him) Provoked: provoked   Notifications:  None Animal's rabies vaccination status:  Unknown Animal in possession: yes   Tetanus status:  Out of date Relieved by:  None tried Worsened by:  Activity Ineffective treatments:  None tried Associated symptoms: swelling       Past Medical History:  Diagnosis Date   Depression    Heart murmur    Thyroid disease     Patient Active Problem List   Diagnosis Date Noted   Severe episode of recurrent major depressive disorder, with psychotic features (HCC) 09/28/2020   Encounter for general adult medical examination with abnormal findings 07/01/2020   Morbid obesity due to excess calories (HCC) 07/01/2020   Major depressive disorder with single episode, in partial remission (HCC) 07/01/2020   Acquired hypothyroidism 07/01/2020   Chronic diarrhea 07/01/2020    Past Surgical History:  Procedure Laterality Date   ESOPHAGOGASTRODUODENOSCOPY (EGD) WITH PROPOFOL N/A 05/12/2015   Procedure: ESOPHAGOGASTRODUODENOSCOPY (EGD) WITH PROPOFOL;  Surgeon: Willis Modena, MD;  Location: [facility]OS[facility];  Service: Endoscopy;  Laterality: N/A;   EUS N/A 05/12/2015   Procedure: UPPER ENDOSCOPIC ULTRASOUND (EUS) RADIAL;  Surgeon: Willis Modena, MD;  Location: [facility];   Service: Endoscopy;  Laterality: N/A;       Family History  Problem Relation Age of Onset   Crohn's disease Mother    Asthma Father    Alcohol abuse Father     Social History   Tobacco Use   Smoking status: Every Day    Packs/day: 1.00    Types: Cigarettes   Smokeless tobacco: Never  Vaping Use   Vaping Use: Never used  Substance Use Topics   Alcohol use: Yes    Comment: social   Drug use: Not Currently    Types: Marijuana    Home Medications Prior to Admission medications   Medication Sig Start Date End Date Taking? Authorizing Provider  hydrOXYzine (ATARAX/VISTARIL) 25 MG tablet Take 1 tablet (25 mg total) by mouth 3 (three) times daily as needed for anxiety. Patient not taking: No sig reported 10/04/20   Antonieta Pert, MD  levothyroxine (SYNTHROID) 25 MCG tablet Take 1 tablet (25 mcg total) by mouth daily at 6 (six) AM. 10/05/20   Clary, Marlane Mingle, MD  mirtazapine (REMERON) 15 MG tablet Take 1 tablet (15 mg total) by mouth at bedtime. 10/04/20   Antonieta Pert, MD  QUEtiapine (SEROQUEL XR) 150 MG 24 hr tablet Take 1 tablet (150 mg total) by mouth at bedtime. 10/04/20   Antonieta Pert, MD  sertraline (ZOLOFT) 100 MG tablet Take 1.5 tablets (150 mg total) by mouth daily. 10/05/20   Antonieta Pert, MD  ARIPiprazole (ABILIFY) 5 MG tablet Take 5 mg by mouth  daily.  06/07/20  [provider]  FLUoxetine (PROZAC) 10 MG tablet Take 1 tablet (10 mg total) by mouth daily. 09/10/19 06/07/20  Mardella Layman, MD    Allergies    Patient has no known allergies.  Review of Systems   Review of Systems  All other systems reviewed and are negative.  Physical Exam Updated Vital Signs BP 114/71   Pulse (!) 51   Temp 98.2 F (36.8 C) (Oral)   Resp 16   Ht 5\' 5"  (1.651 m)   Wt 109.3 kg   SpO2 100%   BMI 40.10 kg/m   Physical Exam Vitals and nursing note reviewed.  Constitutional:      General: He is not in acute distress.    Appearance: Normal  appearance.  HENT:     Head: Normocephalic.  Eyes:     Pupils: Pupils are equal, round, and reactive to light.  Cardiovascular:     Rate and Rhythm: Normal rate.     Pulses: Normal pulses.  Pulmonary:     Effort: Pulmonary effort is normal.  Musculoskeletal:        General: Tenderness and signs of injury present.     Left upper arm: Swelling and tenderness present.       Arms:     Cervical back: Normal range of motion and neck supple.  Skin:    General: Skin is warm and dry.  Neurological:     Mental Status: He is alert and oriented to person, place, and time. Mental status is at baseline.  Psychiatric:        Mood and Affect: Mood normal.        Behavior: Behavior normal.    ED Results / Procedures / Treatments   Labs (all labs ordered are listed, but only abnormal results are displayed) Labs Reviewed - No data to display  EKG None  Radiology No results found.  Procedures Procedures   Medications Ordered in ED Medications - No data to display  ED Course  I have reviewed the triage vital signs and the nursing notes.  Pertinent labs & imaging results that were available during my care of the patient were reviewed by me and considered in my medical decision making (see chart for details).    MDM Rules/Calculators/A&P                           Patient presenting today with a dog bite that occurred yesterday.  Multiple puncture wounds with ecchymosis but no evidence of infection at this time.  Dog is in possession and it was provoked attack with low suspicion for rabies.  Patient's tetanus shot was updated.  Given a course of Augmentin.   Final Clinical Impression(s) / ED Diagnoses Final diagnoses:  Dog bite, initial encounter    Rx / DC Orders ED Discharge Orders          Ordered    amoxicillin-clavulanate (AUGMENTIN) 875-125 MG tablet  Every 12 hours        05/08/21 0853             05/10/21, MD 05/08/21 [phone]

## 2021-05-08 NOTE — Discharge Instructions (Signed)
Continue to wash the area with soap and water.  You can apply some Neosporin as needed.  Return if you start having foul smell or pus draining from your arm.

## 2021-05-08 NOTE — ED Notes (Signed)
Patient states that the owner of the other dog said it had it's rabies vaccination, but proof was not provided. The dog is unknown to the patient.

## 2021-05-08 NOTE — ED Triage Notes (Signed)
Pt states that he was bit on his left upper arm a day ago by a dog while trying to break up a fight.

## 2021-06-14 ENCOUNTER — Encounter (HOSPITAL_COMMUNITY): Payer: Self-pay | Admitting: Emergency Medicine

## 2021-06-14 ENCOUNTER — Other Ambulatory Visit: Payer: Self-pay

## 2021-06-14 ENCOUNTER — Ambulatory Visit (INDEPENDENT_AMBULATORY_CARE_PROVIDER_SITE_OTHER): Payer: BC Managed Care – PPO

## 2021-06-14 ENCOUNTER — Ambulatory Visit (HOSPITAL_COMMUNITY)
Admission: EM | Admit: 2021-06-14 | Discharge: 2021-06-14 | Disposition: A | Discharge status: Home / Self Care | Payer: BC Managed Care – PPO | Attending: Family Medicine | Admitting: Family Medicine

## 2021-06-14 DIAGNOSIS — R059 Cough, unspecified: Secondary | ICD-10-CM | POA: Diagnosis not present

## 2021-06-14 DIAGNOSIS — R079 Chest pain, unspecified: Secondary | ICD-10-CM

## 2021-06-14 DIAGNOSIS — Z20822 Contact with and (suspected) exposure to covid-19: Secondary | ICD-10-CM | POA: Diagnosis not present

## 2021-06-14 DIAGNOSIS — R1110 Vomiting, unspecified: Secondary | ICD-10-CM | POA: Diagnosis not present

## 2021-06-14 DIAGNOSIS — J069 Acute upper respiratory infection, unspecified: Secondary | ICD-10-CM | POA: Insufficient documentation

## 2021-06-14 MED ORDER — IBUPROFEN 800 MG PO TABS: 800.0000 mg | ORAL_TABLET | Freq: Three times a day (TID) | ORAL | 0 refills | Status: DC | PRN

## 2021-06-14 MED ORDER — BENZONATATE 100 MG PO CAPS: 100.0000 mg | ORAL_CAPSULE | Freq: Three times a day (TID) | ORAL | 0 refills | Status: DC | PRN

## 2021-06-14 NOTE — Discharge Instructions (Addendum)
Chest xray was clear, which is good, but doesn't mean you feel good.  Take benzonatate as needed for the cough, and ibuprofen 800 mg for the chest pain. A warm compress on your chest can help too. You have been swabbed for COVID, and the test will result in the next 24 hours. Our staff will call you if positive. If the test is positive, you should quarantine for 5 days.

## 2021-06-14 NOTE — ED Provider Notes (Signed)
[facility]    CSN: [id number] Arrival date & time: 06/14/21  1245      History   Chief Complaint Chief Complaint  Patient presents with   Cough   Emesis   Fever    Chest congestion     HPI Dominic Bryant is a 25 y.o. male.    Cough Associated symptoms: fever   Emesis Associated symptoms: cough and fever   Fever Associated symptoms: cough and vomiting   Here for a 1 to 2-week history of cough with some congestion.  For about a week or 2 he has had some pleuritic chest pain(cough worsens it; when he takes a deep breath, he coughs a lot).  Also in the last few days he has noted some blood in his sputum.  No fever chills.  Past Medical History:  Diagnosis Date   Depression    Heart murmur    Thyroid disease     Patient Active Problem List   Diagnosis Date Noted   Severe episode of recurrent major depressive disorder, with psychotic features ([place]) 09/28/2020   Encounter for general adult medical examination with abnormal findings 07/01/2020   Morbid obesity due to excess calories ([place]) 07/01/2020   Major depressive disorder with single episode, in partial remission ([place]) 07/01/2020   Acquired hypothyroidism 07/01/2020   Chronic diarrhea 07/01/2020    Past Surgical History:  Procedure Laterality Date   ESOPHAGOGASTRODUODENOSCOPY (EGD) WITH PROPOFOL N/A 05/12/2015   Procedure: ESOPHAGOGASTRODUODENOSCOPY (EGD) WITH PROPOFOL;  Surgeon: Arta Silence, MD;  Location: [facility];  Service: Endoscopy;  Laterality: N/A;   EUS N/A 05/12/2015   Procedure: UPPER ENDOSCOPIC ULTRASOUND (EUS) RADIAL;  Surgeon: Arta Silence, MD;  Location: [facility];  Service: Endoscopy;  Laterality: N/A;       Home Medications    Prior to Admission medications   Medication Sig Start Date End Date Taking? Authorizing Provider  benzonatate (TESSALON) 100 MG capsule Take 1 capsule (100 mg total) by mouth 3 (three) times daily as needed for cough. 06/14/21  Yes Barrett Henle, MD  ibuprofen (ADVIL) 800 MG tablet Take 1 tablet (800 mg total) by mouth every 8 (eight) hours as needed (pain). 06/14/21  Yes Barrett Henle, MD  levothyroxine (SYNTHROID) 25 MCG tablet Take 1 tablet (25 mcg total) by mouth daily at 6 (six) AM. 10/05/20   Clary, Cordie Grice, MD  mirtazapine (REMERON) 15 MG tablet Take 1 tablet (15 mg total) by mouth at bedtime. 10/04/20   Sharma Covert, MD  QUEtiapine (SEROQUEL XR) 150 MG 24 hr tablet Take 1 tablet (150 mg total) by mouth at bedtime. 10/04/20   Sharma Covert, MD  sertraline (ZOLOFT) 100 MG tablet Take 1.5 tablets (150 mg total) by mouth daily. 10/05/20   Sharma Covert, MD  ARIPiprazole (ABILIFY) 5 MG tablet Take 5 mg by mouth daily.  06/07/20  [provider]  FLUoxetine (PROZAC) 10 MG tablet Take 1 tablet (10 mg total) by mouth daily. 09/10/19 06/07/20  Vanessa Kick, MD    Family History Family History  Problem Relation Age of Onset   Crohn's disease Mother    Asthma Father    Alcohol abuse Father     Social History Social History   Tobacco Use   Smoking status: Every Day    Packs/day: 1.00    Types: Cigarettes   Smokeless tobacco: Never  Vaping Use   Vaping Use: Never used  Substance Use Topics   Alcohol use: Yes  Comment: social   Drug use: Not Currently    Types: Marijuana     Allergies   Patient has no known allergies.   Review of Systems Review of Systems  Constitutional:  Positive for fever.  Respiratory:  Positive for cough.   Gastrointestinal:  Positive for vomiting.    Physical Exam Triage Vital Signs ED Triage Vitals  Enc Vitals Group     BP 06/14/21 1350 104/68     Pulse Rate 06/14/21 1349 75     Resp 06/14/21 1349 18     Temp 06/14/21 1349 99.2 F (37.3 C)     Temp Source 06/14/21 1349 Oral     SpO2 06/14/21 1349 97 %     Weight --      Height --      Head Circumference --      Peak Flow --      Pain Score 06/14/21 1347 6     Pain Loc --      Pain Edu?  --      Excl. in GC? --    No data found.  Updated Vital Signs BP 104/68    Pulse 75    Temp 99.2 F (37.3 C) (Oral)    Resp 18    SpO2 97%   Visual Acuity Right Eye Distance:   Left Eye Distance:   Bilateral Distance:    Right Eye Near:   Left Eye Near:    Bilateral Near:     Physical Exam Vitals reviewed.  Constitutional:      General: He is not in acute distress.    Appearance: He is not toxic-appearing.     Comments: Able to speak in complete sentences   HENT:     Right Ear: Tympanic membrane normal.     Left Ear: Tympanic membrane normal.     Nose: Nose normal.     Mouth/Throat:     Mouth: Mucous membranes are moist.     Pharynx: Oropharyngeal exudate (white mucus in the OP) present. No posterior oropharyngeal erythema.  Eyes:     Extraocular Movements: Extraocular movements intact.     Conjunctiva/sclera: Conjunctivae normal.     Pupils: Pupils are equal, round, and reactive to light.  Cardiovascular:     Rate and Rhythm: Normal rate and regular rhythm.     Heart sounds: No murmur heard. Pulmonary:     Effort: Pulmonary effort is normal.     Breath sounds: Normal breath sounds. No wheezing, rhonchi or rales.  Musculoskeletal:     Cervical back: Neck supple.  Lymphadenopathy:     Cervical: No cervical adenopathy.  Skin:    Capillary Refill: Capillary refill takes less than 2 seconds.     Coloration: Skin is not jaundiced or pale.  Neurological:     General: No focal deficit present.     Mental Status: He is alert and oriented to person, place, and time.  Psychiatric:        Behavior: Behavior normal.     UC Treatments / Results  Labs (all labs ordered are listed, but only abnormal results are displayed) Labs Reviewed  SARS CORONAVIRUS 2 (TAT 6-24 HRS)    EKG   Radiology DG Chest 2 View  Result Date: 06/14/2021 CLINICAL DATA:  Cough, chest pain and chest congestion in a 25 year old male. EXAM: CHEST - 2 VIEW COMPARISON:  None FINDINGS:  Trachea midline. Cardiomediastinal contours and hilar structures are stable. Lungs are clear. No pleural effusion or visible  pneumothorax. On limited assessment there is no acute skeletal process. IMPRESSION: No acute cardiopulmonary disease. Electronically Signed   By: Zetta Bills M.D.   On: 06/14/2021 14:38    Procedures Procedures (including critical care time)  Medications Ordered in UC Medications - No data to display  Initial Impression / Assessment and Plan / UC Course  I have reviewed the triage vital signs and the nursing notes.  Pertinent labs & imaging results that were available during my care of the patient were reviewed by me and considered in my medical decision making (see chart for details).     CXR clear. Will swab for covid, and treat cough and chest wall pain with tessalon perles and ibuprofen Final Clinical Impressions(s) / UC Diagnoses   Final diagnoses:  Viral upper respiratory tract infection     Discharge Instructions      Chest xray was clear, which is good, but doesn't mean you feel good.  Take benzonatate as needed for the cough, and ibuprofen 800 mg for the chest pain. A warm compress on your chest can help too. You have been swabbed for COVID, and the test will result in the next 24 hours. Our staff will call you if positive. If the test is positive, you should quarantine for 5 days.      ED Prescriptions     Medication Sig Dispense Auth. Provider   benzonatate (TESSALON) 100 MG capsule Take 1 capsule (100 mg total) by mouth 3 (three) times daily as needed for cough. 21 capsule Barrett Henle, MD   ibuprofen (ADVIL) 800 MG tablet Take 1 tablet (800 mg total) by mouth every 8 (eight) hours as needed (pain). 21 tablet Eller Sweis, Gwenlyn Perking, MD      PDMP not reviewed this encounter.   Barrett Henle, MD 06/14/21 [phone]

## 2021-06-14 NOTE — ED Triage Notes (Signed)
Pt is present today with cough, chest congestion, vomiting (blood), and nasal congestion. Pt states that sx started one week ago.   Pt states that he has been having central chest pain for a month . Pt states that the pain intensifies when he coughs.

## 2021-06-15 LAB — SARS CORONAVIRUS 2 (TAT 6-24 HRS): SARS Coronavirus 2: NEGATIVE

## 2021-08-15 ENCOUNTER — Other Ambulatory Visit: Payer: Self-pay | Admitting: Internal Medicine

## 2021-08-17 ENCOUNTER — Other Ambulatory Visit: Payer: Self-pay | Admitting: Internal Medicine

## 2021-08-19 ENCOUNTER — Other Ambulatory Visit: Payer: Self-pay | Admitting: Internal Medicine

## 2021-10-20 ENCOUNTER — Ambulatory Visit (HOSPITAL_COMMUNITY)
Admission: EM | Admit: 2021-10-20 | Discharge: 2021-10-20 | Disposition: A | Discharge status: Home / Self Care | Payer: BC Managed Care – PPO | Attending: Nurse Practitioner | Admitting: Nurse Practitioner

## 2021-10-20 ENCOUNTER — Encounter (HOSPITAL_COMMUNITY): Payer: Self-pay

## 2021-10-20 ENCOUNTER — Other Ambulatory Visit: Payer: Self-pay | Admitting: Internal Medicine

## 2021-10-20 DIAGNOSIS — F332 Major depressive disorder, recurrent severe without psychotic features: Secondary | ICD-10-CM

## 2021-10-20 DIAGNOSIS — Z760 Encounter for issue of repeat prescription: Secondary | ICD-10-CM

## 2021-10-20 MED ORDER — QUETIAPINE FUMARATE ER 150 MG PO TB24: 150.0000 mg | ORAL_TABLET | Freq: Every day | ORAL | 0 refills | Status: DC

## 2021-10-20 MED ORDER — MIRTAZAPINE 15 MG PO TABS: 15.0000 mg | ORAL_TABLET | Freq: Every day | ORAL | 0 refills | Status: DC

## 2021-10-20 MED ORDER — SERTRALINE HCL 100 MG PO TABS: 150.0000 mg | ORAL_TABLET | Freq: Every day | ORAL | 0 refills | Status: DC

## 2021-10-20 NOTE — Discharge Instructions (Addendum)
-   Please resume your medications as previously prescribed ?- Please make an appointment with your primary care provider for further refills  ?- If you would like to speak with a Psychiatrist or Therapist, please refer to the sheet at the end of this packet ?

## 2021-10-20 NOTE — ED Triage Notes (Signed)
Pt requesting refill on his Zoloft and Remeron. States has been out for 1wk.  ?

## 2021-10-20 NOTE — ED Provider Notes (Signed)
?[facility] ? ? ? ?CSN: [id number] ?Arrival date & time: 10/20/21  1607 ? ? ?  ? ?History   ?Chief Complaint ?Chief Complaint  ?Patient presents with  ? Medication Refill  ? ? ?HPI ?Dominic Bryant is a 26 y.o. male.  ? ?Patient presents to urgent care today for medicine refill.  He reports he ran out of his medication including Remeron, sertraline, and quetiapine about 1 week ago.  He does not remember the exact doses, however he did feel that he was doing very well on these medications.  He is requesting refill today.  He denies any current suicidal or homicidal ideation.  He does not have a plan to hurt or kill himself.  He denies down or depressed mood. ? ?Patient reports he does have a primary care provider-he is planning on reaching out to them to make an appointment. ? ? ?Past Medical History:  ?Diagnosis Date  ? Depression   ? Heart murmur   ? Thyroid disease   ? ? ?Patient Active Problem List  ? Diagnosis Date Noted  ? Severe episode of recurrent major depressive disorder, with psychotic features ([facility]) 09/28/2020  ? Encounter for general adult medical examination with abnormal findings 07/01/2020  ? Morbid obesity due to excess calories ([facility]) 07/01/2020  ? Major depressive disorder with single episode, in partial remission ([facility]) 07/01/2020  ? Acquired hypothyroidism 07/01/2020  ? Chronic diarrhea 07/01/2020  ? ? ?Past Surgical History:  ?Procedure Laterality Date  ? ESOPHAGOGASTRODUODENOSCOPY (EGD) WITH PROPOFOL N/A 05/12/2015  ? Procedure: ESOPHAGOGASTRODUODENOSCOPY (EGD) WITH PROPOFOL;  Surgeon: Willis Modena, MD;  Location: [facility];  Service: Endoscopy;  Laterality: N/A;  ? EUS N/A 05/12/2015  ? Procedure: UPPER ENDOSCOPIC ULTRASOUND (EUS) RADIAL;  Surgeon: Willis Modena, MD;  Location: [facility];  Service: Endoscopy;  Laterality: N/A;  ? ? ? ? ? ?Home Medications   ? ?Prior to Admission medications   ?Medication Sig Start Date End Date Taking? Authorizing Provider  ?levothyroxine  (SYNTHROID) 25 MCG tablet Take 1 tablet (25 mcg total) by mouth daily at 6 (six) AM. 10/05/20   Clary, Marlane Mingle, MD  ?mirtazapine (REMERON) 15 MG tablet Take 1 tablet (15 mg total) by mouth at bedtime. 10/20/21   Valentino Nose, NP  ?QUEtiapine Fumarate (SEROQUEL XR) 150 MG 24 hr tablet Take 1 tablet (150 mg total) by mouth at bedtime. 10/20/21   Valentino Nose, NP  ?sertraline (ZOLOFT) 100 MG tablet Take 1.5 tablets (150 mg total) by mouth daily. 10/20/21 11/19/21  Valentino Nose, NP  ?ARIPiprazole (ABILIFY) 5 MG tablet Take 5 mg by mouth daily.  06/07/20  [provider]  ?FLUoxetine (PROZAC) 10 MG tablet Take 1 tablet (10 mg total) by mouth daily. 09/10/19 06/07/20  Mardella Layman, MD  ? ? ?Family History ?Family History  ?Problem Relation Age of Onset  ? Crohn's disease Mother   ? Asthma Father   ? Alcohol abuse Father   ? ? ?Social History ?Social History  ? ?Tobacco Use  ? Smoking status: Every Day  ?  Packs/day: 1.00  ?  Types: Cigarettes  ? Smokeless tobacco: Never  ?Vaping Use  ? Vaping Use: Never used  ?Substance Use Topics  ? Alcohol use: Yes  ?  Comment: social  ? Drug use: Not Currently  ?  Types: Marijuana  ? ? ? ?Allergies   ?Patient has no known allergies. ? ? ?Review of Systems ?Review of Systems ?Per HPI ? ?Physical Exam ?Triage Vital  Signs ?ED Triage Vitals  ?Enc Vitals Group  ?   BP 10/20/21 1709 121/75  ?   Pulse Rate 10/20/21 1709 90  ?   Resp 10/20/21 1709 18  ?   Temp 10/20/21 1709 98.3 ?F (36.8 ?C)  ?   Temp Source 10/20/21 1709 Oral  ?   SpO2 10/20/21 1709 98 %  ?   Weight --   ?   Height --   ?   Head Circumference --   ?   Peak Flow --   ?   Pain Score 10/20/21 1710 0  ?   Pain Loc --   ?   Pain Edu? --   ?   Excl. in GC? --   ? ?No data found. ? ?Updated Vital Signs ?BP 121/75 (BP Location: Left Arm)   Pulse 90   Temp 98.3 ?F (36.8 ?C) (Oral)   Resp 18   SpO2 98%  ? ?Visual Acuity ?Right Eye Distance:   ?Left Eye Distance:   ?Bilateral Distance:   ? ?Right Eye  Near:   ?Left Eye Near:    ?Bilateral Near:    ? ?Physical Exam ?Vitals and nursing note reviewed.  ?Constitutional:   ?   General: He is not in acute distress. ?   Appearance: Normal appearance. He is not toxic-appearing.  ?Pulmonary:  ?   Effort: Pulmonary effort is normal. No respiratory distress.  ?Skin: ?   General: Skin is warm and dry.  ?   Capillary Refill: Capillary refill takes less than 2 seconds.  ?   Coloration: Skin is not jaundiced or pale.  ?   Findings: No erythema.  ?Neurological:  ?   Mental Status: He is alert and oriented to person, place, and time.  ?   Motor: No weakness.  ?   Gait: Gait normal.  ?Psychiatric:     ?   Mood and Affect: Mood normal.     ?   Behavior: Behavior normal.  ? ? ? ?UC Treatments / Results  ?Labs ?(all labs ordered are listed, but only abnormal results are displayed) ?Labs Reviewed - No data to display ? ?EKG ? ? ?Radiology ?No results found. ? ?Procedures ?Procedures (including critical care time) ? ?Medications Ordered in UC ?Medications - No data to display ? ?Initial Impression / Assessment and Plan / UC Course  ?I have reviewed the triage vital signs and the nursing notes. ? ?Pertinent labs & imaging results that were available during my care of the patient were reviewed by me and considered in my medical decision making (see chart for details). ? ?  ?Refills given for Remeron 15 mg daily, sertraline 150 mg daily, and quetiapine XL 100 mg daily.  Encouraged following up with primary care and/or psychiatry for future refills.  Resources given. ?Final Clinical Impressions(s) / UC Diagnoses  ? ?Final diagnoses:  ?Encounter for medication refill  ?Severe episode of recurrent major depressive disorder, without psychotic features ([facility])  ? ? ? ?Discharge Instructions   ? ?  ?- Please resume your medications as previously prescribed ?- Please make an appointment with your primary care provider for further refills  ?- If you would like to speak with a Psychiatrist or  Therapist, please refer to the sheet at the end of this packet ? ? ? ?ED Prescriptions   ? ? Medication Sig Dispense Auth. Provider  ? mirtazapine (REMERON) 15 MG tablet Take 1 tablet (15 mg total) by mouth at bedtime. 30 tablet Bradly Bienenstock,  Guadlupe SpanishJessica A, NP  ? QUEtiapine Fumarate (SEROQUEL XR) 150 MG 24 hr tablet Take 1 tablet (150 mg total) by mouth at bedtime. 30 tablet Cathlean MarseillesMartinez, Morgen Ritacco A, NP  ? sertraline (ZOLOFT) 100 MG tablet Take 1.5 tablets (150 mg total) by mouth daily. 45 tablet Valentino NoseMartinez, Ezme Duch A, NP  ? ?  ? ?PDMP not reviewed this encounter. ?  ?Valentino NoseMartinez, Kolina Kube A, NP ?10/20/21 1812 ? ?

## 2021-12-19 ENCOUNTER — Other Ambulatory Visit: Payer: Self-pay | Admitting: Internal Medicine

## 2021-12-19 ENCOUNTER — Telehealth: Payer: Self-pay | Admitting: Internal Medicine

## 2021-12-19 DIAGNOSIS — F324 Major depressive disorder, single episode, in partial remission: Secondary | ICD-10-CM

## 2021-12-19 MED ORDER — SERTRALINE HCL 100 MG PO TABS: 150.0000 mg | ORAL_TABLET | Freq: Every day | ORAL | 0 refills | Status: DC

## 2022-01-09 ENCOUNTER — Ambulatory Visit (INDEPENDENT_AMBULATORY_CARE_PROVIDER_SITE_OTHER): Payer: BC Managed Care – PPO | Admitting: Internal Medicine

## 2022-01-09 ENCOUNTER — Encounter: Payer: Self-pay | Admitting: Internal Medicine

## 2022-01-09 VITALS — BP 106/70 | HR 64 | Temp 98.3°F | Ht 65.0 in | Wt 231.0 lb

## 2022-01-09 DIAGNOSIS — F324 Major depressive disorder, single episode, in partial remission: Secondary | ICD-10-CM

## 2022-01-09 DIAGNOSIS — E039 Hypothyroidism, unspecified: Secondary | ICD-10-CM | POA: Diagnosis not present

## 2022-01-09 DIAGNOSIS — Z0001 Encounter for general adult medical examination with abnormal findings: Secondary | ICD-10-CM | POA: Diagnosis not present

## 2022-01-09 LAB — CBC WITH DIFFERENTIAL/PLATELET
Basophils Absolute: 0 10*3/uL (ref 0.0–0.1)
Basophils Relative: 0.7 % (ref 0.0–3.0)
Eosinophils Absolute: 0.8 10*3/uL — ABNORMAL HIGH (ref 0.0–0.7)
Eosinophils Relative: 14 % — ABNORMAL HIGH (ref 0.0–5.0)
HCT: 45.9 % (ref 39.0–52.0)
Hemoglobin: 14.9 g/dL (ref 13.0–17.0)
Lymphocytes Relative: 28.8 % (ref 12.0–46.0)
Lymphs Abs: 1.7 10*3/uL (ref 0.7–4.0)
MCHC: 32.4 g/dL (ref 30.0–36.0)
MCV: 89.5 fl (ref 78.0–100.0)
Monocytes Absolute: 0.4 10*3/uL (ref 0.1–1.0)
Monocytes Relative: 6.4 % (ref 3.0–12.0)
Neutro Abs: 2.9 10*3/uL (ref 1.4–7.7)
Neutrophils Relative %: 50.1 % (ref 43.0–77.0)
Platelets: 218 10*3/uL (ref 150.0–400.0)
RBC: 5.13 Mil/uL (ref 4.22–5.81)
RDW: 13.2 % (ref 11.5–15.5)
WBC: 5.9 10*3/uL (ref 4.0–10.5)

## 2022-01-09 LAB — TSH: TSH: 2.13 u[IU]/mL (ref 0.35–5.50)

## 2022-01-09 MED ORDER — SERTRALINE HCL 100 MG PO TABS: 150.0000 mg | ORAL_TABLET | Freq: Every day | ORAL | 1 refills | Status: DC

## 2022-01-09 MED ORDER — QUETIAPINE FUMARATE ER 150 MG PO TB24: 150.0000 mg | ORAL_TABLET | Freq: Every day | ORAL | 1 refills | Status: DC

## 2022-01-09 NOTE — Progress Notes (Signed)
Subjective:  Patient ID: Dominic Bryant, male    DOB: 1996/05/15  Age: 26 y.o. MRN: [id number]  CC: Annual Exam and Hypothyroidism   HPI JAQUON GINGERICH presents for a CPX and f/up -  He tells me he is not taking T4.  He is taking quetiapine and sertraline and is doing well.  He requests a refill.  He feels well today and offers no complaints.  Outpatient Medications Prior to Visit  Medication Sig Dispense Refill   mirtazapine (REMERON) 15 MG tablet Take 1 tablet (15 mg total) by mouth at bedtime. 30 tablet 0   levothyroxine (SYNTHROID) 25 MCG tablet Take 1 tablet (25 mcg total) by mouth daily at 6 (six) AM. 30 tablet 0   QUEtiapine Fumarate (SEROQUEL XR) 150 MG 24 hr tablet Take 1 tablet (150 mg total) by mouth at bedtime. 30 tablet 0   sertraline (ZOLOFT) 100 MG tablet Take 1.5 tablets (150 mg total) by mouth daily. 135 tablet 0   No facility-administered medications prior to visit.    ROS Review of Systems  Constitutional: Negative.  Negative for diaphoresis, fatigue and unexpected weight change.  HENT: Negative.    Eyes: Negative.   Respiratory:  Negative for cough, chest tightness, shortness of breath and wheezing.   Cardiovascular:  Negative for chest pain, palpitations and leg swelling.  Gastrointestinal:  Negative for abdominal pain, constipation, diarrhea, nausea and vomiting.  Endocrine: Negative.  Negative for cold intolerance and heat intolerance.  Genitourinary: Negative.  Negative for penile swelling, scrotal swelling and testicular pain.  Musculoskeletal: Negative.  Negative for arthralgias and myalgias.  Skin: Negative.   Neurological: Negative.  Negative for dizziness, weakness and light-headedness.  Hematological:  Negative for adenopathy. Does not bruise/bleed easily.  Psychiatric/Behavioral: Negative.      Objective:  BP 106/70 (BP Location: Left Arm, Patient Position: Sitting, Cuff Size: Large)   Pulse 64   Temp 98.3 F (36.8 C) (Oral)   Ht 5\' 5"   (1.651 m)   Wt 231 lb (104.8 kg)   SpO2 97%   BMI 38.44 kg/m   BP Readings from Last 3 Encounters:  01/09/22 106/70  10/20/21 121/75  06/14/21 104/68    Wt Readings from Last 3 Encounters:  01/09/22 231 lb (104.8 kg)  05/08/21 241 lb (109.3 kg)  07/01/20 285 lb (129.3 kg)    Physical Exam Vitals reviewed.  HENT:     Nose: Nose normal.     Mouth/Throat:     Mouth: Mucous membranes are moist.  Eyes:     General: No scleral icterus.    Conjunctiva/sclera: Conjunctivae normal.  Cardiovascular:     Rate and Rhythm: Normal rate and regular rhythm.     Heart sounds: No murmur heard. Pulmonary:     Breath sounds: No stridor. No wheezing, rhonchi or rales.  Abdominal:     General: Abdomen is flat.     Palpations: There is no mass.     Tenderness: There is no abdominal tenderness. There is no guarding.     Hernia: No hernia is present.  Musculoskeletal:        General: Normal range of motion.     Cervical back: Neck supple.     Right lower leg: No edema.     Left lower leg: No edema.  Lymphadenopathy:     Cervical: No cervical adenopathy.  Skin:    General: Skin is warm and dry.  Neurological:     General: No focal deficit present.  Mental Status: He is alert.  Psychiatric:        Mood and Affect: Mood normal.        Behavior: Behavior normal.     Lab Results  Component Value Date   WBC 5.9 01/09/2022   HGB 14.9 01/09/2022   HCT 45.9 01/09/2022   PLT 218.0 01/09/2022   GLUCOSE 65 (L) 09/28/2020   CHOL 189 07/01/2020   TRIG 91.0 07/01/2020   HDL 40.50 07/01/2020   LDLCALC 131 (H) 07/01/2020   ALT 26 09/28/2020   AST 31 09/28/2020   NA 139 09/28/2020   K 3.7 09/28/2020   CL 105 09/28/2020   CREATININE 1.05 09/28/2020   BUN 6 09/28/2020   CO2 24 09/28/2020   TSH 2.13 01/09/2022   HGBA1C 4.6 07/01/2020    No results found.  Assessment & Plan:   Mitchael was seen today for annual exam and hypothyroidism.  Diagnoses and all orders for this  visit:  Acquired hypothyroidism- He is euthyroid on no T4.  Thyroid replacement therapy is not indicated. -     TSH; Future -     CBC with Differential/Platelet; Future -     Thyroid peroxidase antibody; Future -     Thyroid peroxidase antibody -     CBC with Differential/Platelet -     TSH  Encounter for general adult medical examination with abnormal findings- Exam completed, labs reviewed, vaccines are up-to-date, no cancer screenings indicated, patient education was given.  Major depressive disorder with single episode, in partial remission (HCC) -     QUEtiapine Fumarate (SEROQUEL XR) 150 MG 24 hr tablet; Take 1 tablet (150 mg total) by mouth at bedtime. -     sertraline (ZOLOFT) 100 MG tablet; Take 1.5 tablets (150 mg total) by mouth daily.   I have discontinued Harl S. Fitterer's levothyroxine. I am also having him maintain his mirtazapine, QUEtiapine Fumarate, and sertraline.  Meds ordered this encounter  Medications   QUEtiapine Fumarate (SEROQUEL XR) 150 MG 24 hr tablet    Sig: Take 1 tablet (150 mg total) by mouth at bedtime.    Dispense:  90 tablet    Refill:  1   sertraline (ZOLOFT) 100 MG tablet    Sig: Take 1.5 tablets (150 mg total) by mouth daily.    Dispense:  135 tablet    Refill:  1     Follow-up: Return in about 6 months (around 07/12/2022).  Sanda Linger, MD

## 2022-01-09 NOTE — Patient Instructions (Signed)

## 2022-01-11 LAB — THYROID PEROXIDASE ANTIBODY: Thyroperoxidase Ab SerPl-aCnc: 118 IU/mL — ABNORMAL HIGH (ref <9)

## 2022-04-10 ENCOUNTER — Other Ambulatory Visit: Payer: Self-pay | Admitting: Internal Medicine

## 2022-04-10 DIAGNOSIS — F324 Major depressive disorder, single episode, in partial remission: Secondary | ICD-10-CM

## 2022-04-11 MED ORDER — SERTRALINE HCL 100 MG PO TABS: 150.0000 mg | ORAL_TABLET | Freq: Every day | ORAL | 0 refills | Status: DC

## 2022-04-25 ENCOUNTER — Ambulatory Visit: Payer: BC Managed Care – PPO | Admitting: Internal Medicine

## 2022-05-04 ENCOUNTER — Telehealth: Payer: Self-pay | Admitting: Internal Medicine

## 2022-05-04 NOTE — Telephone Encounter (Signed)
For our records:   We have received disability benefit PW for the pt and it has been placed in Dr. Edward Qualia box.   Upon completion please fax to:  [phone]

## 2022-05-08 ENCOUNTER — Ambulatory Visit: Payer: BC Managed Care – PPO | Admitting: Internal Medicine

## 2022-05-08 NOTE — Telephone Encounter (Signed)
Forms can not be located.

## 2022-05-11 NOTE — Telephone Encounter (Signed)
Forms have been found. PCP had them filed.   Pt has been scheduled to discuss for 11/29 @ 2.20pm  CMA will hold forms until that time.

## 2022-05-14 ENCOUNTER — Emergency Department (HOSPITAL_COMMUNITY)
Admission: EM | Admit: 2022-05-14 | Discharge: 2022-05-14 | Disposition: A | Discharge status: Home / Self Care | Payer: BC Managed Care – PPO | Attending: Emergency Medicine | Admitting: Emergency Medicine

## 2022-05-14 ENCOUNTER — Other Ambulatory Visit: Payer: Self-pay

## 2022-05-14 DIAGNOSIS — Z7251 High risk heterosexual behavior: Secondary | ICD-10-CM | POA: Diagnosis not present

## 2022-05-14 DIAGNOSIS — N4829 Other inflammatory disorders of penis: Secondary | ICD-10-CM | POA: Diagnosis not present

## 2022-05-14 DIAGNOSIS — N4889 Other specified disorders of penis: Secondary | ICD-10-CM | POA: Diagnosis not present

## 2022-05-14 DIAGNOSIS — R369 Urethral discharge, unspecified: Secondary | ICD-10-CM | POA: Diagnosis not present

## 2022-05-14 LAB — URINALYSIS, ROUTINE W REFLEX MICROSCOPIC
Bacteria, UA: NONE SEEN
Glucose, UA: NEGATIVE mg/dL
Hgb urine dipstick: NEGATIVE
Ketones, ur: 5 mg/dL — AB
Leukocytes,Ua: NEGATIVE
Nitrite: NEGATIVE
Protein, ur: 100 mg/dL — AB
Specific Gravity, Urine: 1.034 — ABNORMAL HIGH (ref 1.005–1.030)
pH: 5 (ref 5.0–8.0)

## 2022-05-14 MED ORDER — LIDOCAINE HCL (PF) 1 % IJ SOLN
INTRAMUSCULAR | Status: AC
  Administered 2022-05-14: 2.1 mL
  Filled 2022-05-14: qty 5

## 2022-05-14 MED ORDER — AQUAPHOR EX OINT: TOPICAL_OINTMENT | Freq: Every day | CUTANEOUS | 0 refills | Status: AC | PRN

## 2022-05-14 MED ORDER — LIDOCAINE HCL (PF) 1 % IJ SOLN: 1.0000 mL | Freq: Once | INTRAMUSCULAR | Status: AC

## 2022-05-14 MED ORDER — CEFTRIAXONE SODIUM 500 MG IJ SOLR
500.0000 mg | Freq: Once | INTRAMUSCULAR | Status: AC
  Administered 2022-05-14: 500 mg via INTRAMUSCULAR
  Filled 2022-05-14: qty 500

## 2022-05-14 MED ORDER — DOXYCYCLINE HYCLATE 100 MG PO TABS: 100.0000 mg | ORAL_TABLET | Freq: Two times a day (BID) | ORAL | 0 refills | Status: DC

## 2022-05-14 NOTE — ED Triage Notes (Signed)
Pt reports irritation of his foreskin for about 2 weeks. Denies burning with urination or penile d/c

## 2022-05-14 NOTE — Discharge Instructions (Addendum)
It was a pleasure caring for you today in the emergency department.  Please complete antibiotics before resuming sexual activity  Please refrain from shaving hair at base of your penis  Please follow up on my chart regarding results of std testing   Please return to the emergency department for any worsening or worrisome symptoms.

## 2022-05-14 NOTE — ED Provider Triage Note (Signed)
  Emergency Medicine Provider Triage Evaluation Note  MRN:  [id number]  Arrival date & time: 05/14/22    Medically screening exam initiated at 6:13 AM.   CC:   Penile Discharge   HPI:  Dominic Bryant is a 26 y.o. year-old male presents to the ED with chief complaint of penile irritation.  States that he has had some raw skin around the penis.  Denies discharge.  History provided by patient. ROS:  -As included in HPI PE:   Vitals:   05/14/22 0602  BP: 114/73  Pulse: 75  Resp: 16  Temp: 98.3 F (36.8 C)  SpO2: 98%    Non-toxic appearing No respiratory distress  MDM:   I've ordered UA in triage to expedite lab/diagnostic workup.  Patient was informed that the remainder of the evaluation will be completed by another provider, this initial triage assessment does not replace that evaluation, and the importance of remaining in the ED until their evaluation is complete.    Roxy Horseman, PA-C 05/14/22 [phone]

## 2022-05-14 NOTE — ED Provider Notes (Signed)
[facility] EMERGENCY DEPARTMENT Provider Note   CSN: [id number] Arrival date & time: 05/14/22  0543     History  Chief Complaint  Patient presents with   Penile Discharge    Dominic Bryant is a 26 y.o. male.  Patient as above with significant medical history as below, including depression, heart murmur, thigh who presents to the ED with complaint of irritation of penis. Pt reports over last 2 weeks base of his penis has been irritated, itching, uncomfortable to the touch. No penis discharge, or testicle/penile swelling. Similar problem in the past that did resolve spontaneously. Recent unprotected sexual activity, denies hx of sti but would like to be tested. Not DM, no scrotal rash or swelling. No trauma. No abd pain, no n/v, no fevers or chills. Last time this happened it resolved spontaneously, did not seek medical care     Past Medical History:  Diagnosis Date   Depression    Heart murmur    Thyroid disease     Past Surgical History:  Procedure Laterality Date   ESOPHAGOGASTRODUODENOSCOPY (EGD) WITH PROPOFOL N/A 05/12/2015   Procedure: ESOPHAGOGASTRODUODENOSCOPY (EGD) WITH PROPOFOL;  Surgeon: Willis Modena, MD;  Location: [facility] ENDOSCOPY;  Service: Endoscopy;  Laterality: N/A;   EUS N/A 05/12/2015   Procedure: UPPER ENDOSCOPIC ULTRASOUND (EUS) RADIAL;  Surgeon: Willis Modena, MD;  Location: [facility] ENDOSCOPY;  Service: Endoscopy;  Laterality: N/A;     The history is provided by the patient. No language interpreter was used.  Penile Discharge Pertinent negatives include no chest pain, no abdominal pain, no headaches and no shortness of breath.       Home Medications Prior to Admission medications   Medication Sig Start Date End Date Taking? Authorizing Provider  doxycycline (VIBRA-TABS) 100 MG tablet Take 1 tablet (100 mg total) by mouth 2 (two) times daily. 05/14/22  Yes Tanda Rockers A, DO  mirtazapine (REMERON) 15 MG tablet Take 1 tablet (15 mg total)  by mouth at bedtime. 10/20/21   Valentino Nose, NP  QUEtiapine Fumarate (SEROQUEL XR) 150 MG 24 hr tablet Take 1 tablet (150 mg total) by mouth at bedtime. 01/09/22   Etta Grandchild, MD  sertraline (ZOLOFT) 100 MG tablet Take 1.5 tablets (150 mg total) by mouth daily. 04/11/22 05/11/22  Etta Grandchild, MD  ARIPiprazole (ABILIFY) 5 MG tablet Take 5 mg by mouth daily.  06/07/20  [provider]  FLUoxetine (PROZAC) 10 MG tablet Take 1 tablet (10 mg total) by mouth daily. 09/10/19 06/07/20  Mardella Layman, MD      Allergies    Patient has no known allergies.    Review of Systems   Review of Systems  Constitutional:  Negative for chills and fever.  HENT:  Negative for facial swelling and trouble swallowing.   Eyes:  Negative for photophobia and visual disturbance.  Respiratory:  Negative for cough and shortness of breath.   Cardiovascular:  Negative for chest pain and palpitations.  Gastrointestinal:  Negative for abdominal pain, nausea and vomiting.  Endocrine: Negative for polydipsia and polyuria.  Genitourinary:  Positive for penile pain. Negative for difficulty urinating and hematuria.  Musculoskeletal:  Negative for gait problem and joint swelling.  Skin:  Positive for rash. Negative for pallor.  Neurological:  Negative for syncope and headaches.  Psychiatric/Behavioral:  Negative for agitation and confusion.     Physical Exam Updated Vital Signs BP 114/73   Pulse 75   Temp 98.3 F (36.8 C) (Oral)   Resp  16   SpO2 98%  Physical Exam Vitals and nursing note reviewed. Exam conducted with a chaperone present.  Constitutional:      General: He is not in acute distress.    Appearance: He is well-developed.  HENT:     Head: Normocephalic and atraumatic.     Right Ear: External ear normal.     Left Ear: External ear normal.     Mouth/Throat:     Mouth: Mucous membranes are moist.  Eyes:     General: No scleral icterus. Cardiovascular:     Rate and Rhythm: Normal  rate and regular rhythm.     Pulses: Normal pulses.     Heart sounds: Normal heart sounds.  Pulmonary:     Effort: Pulmonary effort is normal. No respiratory distress.     Breath sounds: Normal breath sounds.  Abdominal:     General: Abdomen is flat.     Palpations: Abdomen is soft.     Tenderness: There is no abdominal tenderness.  Genitourinary:    Penis: Circumcised.      Testes: Normal. Cremasteric reflex is present.     Comments: No scrotal swelling or edema, no cellulitis Minor irritation noted at base of penis around area of recent shaving  No vesicles No rash to tip of penis or lesions to glans No penile discharge   Musculoskeletal:        General: Normal range of motion.     Cervical back: Normal range of motion.     Right lower leg: No edema.     Left lower leg: No edema.  Skin:    General: Skin is warm and dry.     Capillary Refill: Capillary refill takes less than 2 seconds.  Neurological:     Mental Status: He is alert and oriented to person, place, and time.  Psychiatric:        Mood and Affect: Mood normal.        Behavior: Behavior normal.     ED Results / Procedures / Treatments   Labs (all labs ordered are listed, but only abnormal results are displayed) Labs Reviewed  URINALYSIS, ROUTINE W REFLEX MICROSCOPIC - Abnormal; Notable for the following components:      Result Value   Color, Urine AMBER (*)    APPearance HAZY (*)    Specific Gravity, Urine 1.034 (*)    Bilirubin Urine SMALL (*)    Ketones, ur 5 (*)    Protein, ur 100 (*)    All other components within normal limits  GC/CHLAMYDIA PROBE AMP ([place]) NOT AT [facility]    EKG None  Radiology No results found.  Procedures Procedures    Medications Ordered in ED Medications  cefTRIAXone (ROCEPHIN) injection 500 mg (has no administration in time range)  lidocaine (PF) (XYLOCAINE) 1 % injection 1-2.1 mL (has no administration in time range)    ED Course/ Medical Decision Making/  A&P                           Medical Decision Making Risk Prescription drug management.   This patient presents to the ED with chief complaint(s) of gu complaint with pertinent past medical history of above which further complicates the presenting complaint. The complaint involves an extensive differential diagnosis and also carries with it a high risk of complications and morbidity.    The differential diagnosis includes but not limited to sti, herpes, razor burn, folliculitis, cellulitis, etc .  Serious etiologies were considered.   The initial plan is to sti testing   Additional history obtained: Additional history obtained from  na Records reviewed Primary Care Documents and prior ed visits  Independent labs interpretation:  The following labs were independently interpreted: ua; no uti  Independent visualization of imaging: Na  Treatment and Reassessment: Rocephin Doxy Ointment for home Supportive care, skin care precautions >> improved  Consultation: - Consulted or discussed management/test interpretation w/ external professional: na  Consideration for admission or further workup: Admission was considered   Pt has opted for empiric treatment for sti, will give rocephin/doxy. Also give skin protectant for home, advised he refrain from shaving to affected area for next 2 weeks, rted if worse  Encouraged smokiing cessation  The patient improved significantly and was discharged in stable condition. Detailed discussions were had with the patient regarding current findings, and need for close f/u with PCP or on call doctor. The patient has been instructed to return immediately if the symptoms worsen in any way for re-evaluation. Patient verbalized understanding and is in agreement with current care plan. All questions answered prior to discharge.    Social Determinants of health: Smoking thc Social History   Tobacco Use   Smoking status: Every Day    Packs/day:  1.00    Types: Cigarettes   Smokeless tobacco: Never  Vaping Use   Vaping Use: Never used  Substance Use Topics   Alcohol use: Yes    Comment: social   Drug use: Not Currently    Types: Marijuana            Final Clinical Impression(s) / ED Diagnoses Final diagnoses:  Unprotected sex  Irritation of penis    Rx / DC Orders ED Discharge Orders          Ordered    doxycycline (VIBRA-TABS) 100 MG tablet  2 times daily        05/14/22 1130              Tanda Rockers A, DO 05/14/22 1140

## 2022-05-24 ENCOUNTER — Ambulatory Visit: Payer: BC Managed Care – PPO | Admitting: Internal Medicine

## 2022-07-03 ENCOUNTER — Ambulatory Visit (INDEPENDENT_AMBULATORY_CARE_PROVIDER_SITE_OTHER): Payer: BC Managed Care – PPO | Admitting: Internal Medicine

## 2022-07-03 ENCOUNTER — Ambulatory Visit (INDEPENDENT_AMBULATORY_CARE_PROVIDER_SITE_OTHER): Payer: BC Managed Care – PPO

## 2022-07-03 ENCOUNTER — Encounter: Payer: Self-pay | Admitting: Internal Medicine

## 2022-07-03 VITALS — BP 110/66 | HR 70 | Temp 98.3°F | Ht 65.0 in | Wt 231.0 lb

## 2022-07-03 DIAGNOSIS — R059 Cough, unspecified: Secondary | ICD-10-CM

## 2022-07-03 DIAGNOSIS — F333 Major depressive disorder, recurrent, severe with psychotic symptoms: Secondary | ICD-10-CM

## 2022-07-03 DIAGNOSIS — E039 Hypothyroidism, unspecified: Secondary | ICD-10-CM | POA: Diagnosis not present

## 2022-07-03 MED ORDER — MIRTAZAPINE 7.5 MG PO TABS: 7.5000 mg | ORAL_TABLET | Freq: Every day | ORAL | 0 refills | Status: DC

## 2022-07-03 MED ORDER — SERTRALINE HCL 50 MG PO TABS: 50.0000 mg | ORAL_TABLET | Freq: Every day | ORAL | 0 refills | Status: DC

## 2022-07-03 MED ORDER — QUETIAPINE FUMARATE 25 MG PO TABS: 25.0000 mg | ORAL_TABLET | Freq: Every day | ORAL | 0 refills | Status: DC

## 2022-07-03 NOTE — Patient Instructions (Signed)

## 2022-07-03 NOTE — Progress Notes (Signed)
Subjective:  Patient ID: Dominic Bryant, male    DOB: 04/24/1996  Age: 27 y.o. MRN: [id number]  CC: Cough   HPI [name] presents for f/up -  He complains of a several month history of nonproductive cough.  He recently took a course of doxycycline but got no improvement.  He has not taken any of his meds for psychosis/depression in 6 months.  A week ago he had a thoughts that told him to hurt people but there is no specific plan and he will not act on those thoughts.  He denies suicidal ideation.  He complains of insomnia.  Outpatient Medications Prior to Visit  Medication Sig Dispense Refill   doxycycline (VIBRA-TABS) 100 MG tablet Take 1 tablet (100 mg total) by mouth 2 (two) times daily. 14 tablet 0   QUEtiapine Fumarate (SEROQUEL XR) 150 MG 24 hr tablet Take 1 tablet (150 mg total) by mouth at bedtime. 90 tablet 1   mirtazapine (REMERON) 15 MG tablet Take 1 tablet (15 mg total) by mouth at bedtime. 30 tablet 0   sertraline (ZOLOFT) 100 MG tablet Take 1.5 tablets (150 mg total) by mouth daily. 135 tablet 0   No facility-administered medications prior to visit.    ROS Review of Systems  Constitutional: Negative.  Negative for chills, diaphoresis, fatigue and fever.  HENT: Negative.  Negative for trouble swallowing.   Eyes: Negative.   Respiratory:  Positive for cough. Negative for chest tightness, shortness of breath and wheezing.   Cardiovascular:  Negative for chest pain, palpitations and leg swelling.  Gastrointestinal:  Negative for abdominal pain, diarrhea, nausea and vomiting.  Endocrine: Negative.   Genitourinary: Negative.  Negative for difficulty urinating.  Musculoskeletal: Negative.   Skin: Negative.  Negative for rash.  Neurological: Negative.  Negative for dizziness and weakness.  Hematological:  Negative for adenopathy. Does not bruise/bleed easily.  Psychiatric/Behavioral:  Positive for dysphoric mood, hallucinations and sleep disturbance. Negative for  agitation, confusion, decreased concentration, self-injury and suicidal ideas. The patient is not nervous/anxious and is not hyperactive.     Objective:  BP 110/66 (BP Location: Left Arm, Patient Position: Sitting, Cuff Size: Large)   Pulse 70   Temp 98.3 F (36.8 C) (Oral)   Ht 5\' 5"  (1.651 m)   Wt 231 lb (104.8 kg)   SpO2 99%   BMI 38.44 kg/m   BP Readings from Last 3 Encounters:  07/03/22 110/66  05/14/22 114/73  01/09/22 106/70    Wt Readings from Last 3 Encounters:  07/03/22 231 lb (104.8 kg)  01/09/22 231 lb (104.8 kg)  05/08/21 241 lb (109.3 kg)    Physical Exam Vitals reviewed.  Constitutional:      Appearance: He is not ill-appearing.  HENT:     Mouth/Throat:     Mouth: Mucous membranes are moist.  Eyes:     General: No scleral icterus.    Conjunctiva/sclera: Conjunctivae normal.  Cardiovascular:     Rate and Rhythm: Normal rate and regular rhythm.     Heart sounds: No murmur heard. Pulmonary:     Effort: Pulmonary effort is normal.     Breath sounds: No stridor. No wheezing, rhonchi or rales.  Abdominal:     General: Abdomen is protuberant. There is no distension.     Palpations: There is no mass.     Tenderness: There is no abdominal tenderness. There is no guarding.     Hernia: No hernia is present.  Musculoskeletal:  General: Normal range of motion.     Cervical back: Neck supple.     Right lower leg: No edema.     Left lower leg: No edema.  Lymphadenopathy:     Cervical: No cervical adenopathy.  Skin:    General: Skin is warm and dry.  Neurological:     General: No focal deficit present.     Mental Status: He is alert.  Psychiatric:        Mood and Affect: Mood normal.        Behavior: Behavior normal.     Lab Results  Component Value Date   WBC 5.9 01/09/2022   HGB 14.9 01/09/2022   HCT 45.9 01/09/2022   PLT 218.0 01/09/2022   GLUCOSE 65 (L) 09/28/2020   CHOL 189 07/01/2020   TRIG 91.0 07/01/2020   HDL 40.50 07/01/2020    LDLCALC 131 (H) 07/01/2020   ALT 26 09/28/2020   AST 31 09/28/2020   NA 139 09/28/2020   K 3.7 09/28/2020   CL 105 09/28/2020   CREATININE 1.05 09/28/2020   BUN 6 09/28/2020   CO2 24 09/28/2020   TSH 2.13 01/09/2022   HGBA1C 4.6 07/01/2020    DG Chest 2 View  Result Date: 07/03/2022 CLINICAL DATA:  Cough for 2 months. EXAM: CHEST - 2 VIEW COMPARISON:  06/14/2021. FINDINGS: Trachea is midline. Heart size stable. Lungs are clear. No pleural fluid. IMPRESSION: No acute findings. Electronically Signed   By: Lorin Picket M.D.   On: 07/03/2022 09:44     Assessment & Plan:   Lovis was seen today for cough.  Diagnoses and all orders for this visit:  Acquired hypothyroidism- He appears euthyroid.  Will monitor his TSH level. -     TSH; Future  Severe episode of recurrent major depressive disorder, with psychotic features ([place])- Will restart sertraline, mirtazapine, and quetiapine. -     sertraline (ZOLOFT) 50 MG tablet; Take 1 tablet (50 mg total) by mouth daily. -     mirtazapine (REMERON) 7.5 MG tablet; Take 1 tablet (7.5 mg total) by mouth at bedtime. -     QUEtiapine (SEROQUEL) 25 MG tablet; Take 1 tablet (25 mg total) by mouth at bedtime.  Cough in adult- Chest x-ray is negative for mass or infiltrate. -     DG Chest 2 View; Future   I have discontinued Woodson S. Cathy's mirtazapine and sertraline. I am also having him start on sertraline, mirtazapine, and QUEtiapine. Additionally, I am having him maintain his QUEtiapine Fumarate and doxycycline.  Meds ordered this encounter  Medications   sertraline (ZOLOFT) 50 MG tablet    Sig: Take 1 tablet (50 mg total) by mouth daily.    Dispense:  30 tablet    Refill:  0   mirtazapine (REMERON) 7.5 MG tablet    Sig: Take 1 tablet (7.5 mg total) by mouth at bedtime.    Dispense:  30 tablet    Refill:  0   QUEtiapine (SEROQUEL) 25 MG tablet    Sig: Take 1 tablet (25 mg total) by mouth at bedtime.    Dispense:  30 tablet    Refill:   0     Follow-up: Return in about 3 months (around 10/02/2022).  Scarlette Calico, MD

## 2022-07-07 ENCOUNTER — Encounter: Payer: Self-pay | Admitting: Internal Medicine

## 2022-08-18 ENCOUNTER — Ambulatory Visit (HOSPITAL_COMMUNITY)
Admission: EM | Admit: 2022-08-18 | Discharge: 2022-08-18 | Disposition: A | Discharge status: Home / Self Care | Payer: BC Managed Care – PPO | Attending: Emergency Medicine | Admitting: Emergency Medicine

## 2022-08-18 ENCOUNTER — Encounter (HOSPITAL_COMMUNITY): Payer: Self-pay | Admitting: *Deleted

## 2022-08-18 ENCOUNTER — Ambulatory Visit (INDEPENDENT_AMBULATORY_CARE_PROVIDER_SITE_OTHER): Payer: BC Managed Care – PPO

## 2022-08-18 DIAGNOSIS — M79641 Pain in right hand: Secondary | ICD-10-CM | POA: Diagnosis not present

## 2022-08-18 DIAGNOSIS — S6991XA Unspecified injury of right wrist, hand and finger(s), initial encounter: Secondary | ICD-10-CM | POA: Diagnosis not present

## 2022-08-18 DIAGNOSIS — W2209XA Striking against other stationary object, initial encounter: Secondary | ICD-10-CM

## 2022-08-18 MED ORDER — IBUPROFEN 800 MG PO TABS
800.0000 mg | ORAL_TABLET | Freq: Once | ORAL | Status: AC
  Administered 2022-08-18: 800 mg via ORAL

## 2022-08-18 MED ORDER — IBUPROFEN 800 MG PO TABS
ORAL_TABLET | ORAL | Status: AC
  Filled 2022-08-18: qty 1

## 2022-08-18 NOTE — ED Triage Notes (Signed)
Pt states he punch a brick fireplace last Saturday. He complains of right hand pain still. He states he hasn't taken any meds for the pain

## 2022-08-18 NOTE — ED Provider Notes (Signed)
[place]    CSN: [id number] Arrival date & time: 08/18/22  [id number]      History   Chief Complaint Chief Complaint  Patient presents with   Hand Injury    HPI Dominic Bryant is a 27 y.o. male.  Presents 1 week after punching a brick wall Reports right hand pain.  Today 7/10 He has not taken any medications for the pain No numbness or tingling Denies previous injury  Past Medical History:  Diagnosis Date   Depression    Heart murmur    Thyroid disease     Patient Active Problem List   Diagnosis Date Noted   Severe episode of recurrent major depressive disorder, with psychotic features ([place]) 09/28/2020   Encounter for general adult medical examination with abnormal findings 07/01/2020   Morbid obesity due to excess calories ([place]) 07/01/2020   Acquired hypothyroidism 07/01/2020    Past Surgical History:  Procedure Laterality Date   ESOPHAGOGASTRODUODENOSCOPY (EGD) WITH PROPOFOL N/A 05/12/2015   Procedure: ESOPHAGOGASTRODUODENOSCOPY (EGD) WITH PROPOFOL;  Surgeon: Arta Silence, MD;  Location: [facility];  Service: Endoscopy;  Laterality: N/A;   EUS N/A 05/12/2015   Procedure: UPPER ENDOSCOPIC ULTRASOUND (EUS) RADIAL;  Surgeon: Arta Silence, MD;  Location: [facility];  Service: Endoscopy;  Laterality: N/A;       Home Medications    Prior to Admission medications   Medication Sig Start Date End Date Taking? Authorizing Provider  sertraline (ZOLOFT) 50 MG tablet Take 1 tablet (50 mg total) by mouth daily. 07/03/22  Yes Janith Lima, MD  mirtazapine (REMERON) 7.5 MG tablet Take 1 tablet (7.5 mg total) by mouth at bedtime. 07/03/22   Janith Lima, MD  QUEtiapine Fumarate (SEROQUEL XR) 150 MG 24 hr tablet Take 1 tablet (150 mg total) by mouth at bedtime. 01/09/22   Janith Lima, MD  ARIPiprazole (ABILIFY) 5 MG tablet Take 5 mg by mouth daily.  06/07/20  [provider]  FLUoxetine (PROZAC) 10 MG tablet Take 1 tablet (10 mg total) by mouth  daily. 09/10/19 06/07/20  Vanessa Kick, MD    Family History Family History  Problem Relation Age of Onset   Crohn's disease Mother    Asthma Father    Alcohol abuse Father     Social History Social History   Tobacco Use   Smoking status: Every Day    Packs/day: 1.00    Types: Cigarettes   Smokeless tobacco: Never  Vaping Use   Vaping Use: Never used  Substance Use Topics   Alcohol use: Yes    Comment: social   Drug use: Yes    Types: Marijuana     Allergies   Patient has no known allergies.   Review of Systems Review of Systems As per HPI  Physical Exam Triage Vital Signs ED Triage Vitals  Enc Vitals Group     BP 08/18/22 1016 101/63     Pulse Rate 08/18/22 1016 69     Resp 08/18/22 1016 18     Temp 08/18/22 1016 98.1 F (36.7 C)     Temp Source 08/18/22 1016 Oral     SpO2 08/18/22 1016 97 %     Weight --      Height --      Head Circumference --      Peak Flow --      Pain Score 08/18/22 1014 7     Pain Loc --      Pain Edu? --  Excl. in GC? --    No data found.  Updated Vital Signs BP 101/63 (BP Location: Right Arm)   Pulse 69   Temp 98.1 F (36.7 C) (Oral)   Resp 18   SpO2 97%    Physical Exam Vitals and nursing note reviewed.  Constitutional:      General: He is not in acute distress. Cardiovascular:     Rate and Rhythm: Normal rate and regular rhythm.     Pulses: Normal pulses.  Pulmonary:     Effort: Pulmonary effort is normal.  Musculoskeletal:     Right hand: No swelling, deformity, tenderness or bony tenderness. Normal range of motion. Normal strength. Normal sensation. Normal pulse.     Left hand: Normal.     Comments: Grip strength and sensation intact, cap refill < 2 seconds. Strong radial pulse. Full ROM of fingers and wrist. No bony tenderness. There are some healing abrasions on the dorsal hand over knuckles  Skin:    General: Skin is warm and dry.     Capillary Refill: Capillary refill takes less than 2 seconds.   Neurological:     Mental Status: He is alert and oriented to person, place, and time.     UC Treatments / Results  Labs (all labs ordered are listed, but only abnormal results are displayed) Labs Reviewed - No data to display  EKG   Radiology DG Hand Complete Right  Result Date: 08/18/2022 CLINICAL DATA:  27 year old male punched a brick fireplace last weekend. Continued pain. EXAM: RIGHT HAND - COMPLETE 3+ VIEW COMPARISON:  None Available. FINDINGS: There is no evidence of fracture or dislocation. There is no evidence of arthropathy or other focal bone abnormality. Bone mineralization is within normal limits. No discrete soft tissue injury. IMPRESSION: Negative. Electronically Signed   By: Genevie Ann M.D.   On: 08/18/2022 10:38    Procedures Procedures (including critical care time)  Medications Ordered in UC Medications  ibuprofen (ADVIL) tablet 800 mg (800 mg Oral Given 08/18/22 1043)    Initial Impression / Assessment and Plan / UC Course  I have reviewed the triage vital signs and the nursing notes.  Pertinent labs & imaging results that were available during my care of the patient were reviewed by me and considered in my medical decision making (see chart for details).  Right hand x-ray negative. Ibuprofen dose given Discussed pain control, ice Follow with ortho if needed Return precautions discussed. Patient agrees to plan  Final Clinical Impressions(s) / UC Diagnoses   Final diagnoses:  Injury of right hand, initial encounter     Discharge Instructions      I recommend ibuprofen or tylenol for pain Apply ice for 20 minutes, 3-4 times daily Gentle stretching  Follow with the orthopedic specialists if symptoms persist ([facility])     ED Prescriptions   None    PDMP not reviewed this encounter.   Pascale Maves, [place] Gu[place], [place] 08/18/22 1101

## 2022-08-18 NOTE — Discharge Instructions (Addendum)
I recommend ibuprofen or tylenol for pain Apply ice for 20 minutes, 3-4 times daily Gentle stretching  Follow with the orthopedic specialists if symptoms persist ([facility])

## 2022-09-17 ENCOUNTER — Other Ambulatory Visit: Payer: Self-pay | Admitting: Internal Medicine

## 2022-09-17 DIAGNOSIS — F333 Major depressive disorder, recurrent, severe with psychotic symptoms: Secondary | ICD-10-CM

## 2022-09-18 ENCOUNTER — Other Ambulatory Visit: Payer: Self-pay

## 2022-09-18 ENCOUNTER — Other Ambulatory Visit (HOSPITAL_COMMUNITY): Payer: Self-pay

## 2022-09-18 MED ORDER — MIRTAZAPINE 7.5 MG PO TABS
7.5000 mg | ORAL_TABLET | Freq: Every day | ORAL | 0 refills | Status: DC
  Filled 2022-09-18 – 2022-10-02 (×2): qty 30, 30d supply, fill #0

## 2022-09-18 MED ORDER — SERTRALINE HCL 50 MG PO TABS
50.0000 mg | ORAL_TABLET | Freq: Every day | ORAL | 0 refills | Status: DC
  Filled 2022-09-18 – 2022-10-02 (×2): qty 30, 30d supply, fill #0

## 2022-09-19 ENCOUNTER — Other Ambulatory Visit: Payer: Self-pay

## 2022-09-22 ENCOUNTER — Other Ambulatory Visit: Payer: Self-pay

## 2022-10-02 ENCOUNTER — Other Ambulatory Visit (HOSPITAL_COMMUNITY): Payer: Self-pay

## 2022-10-05 ENCOUNTER — Ambulatory Visit (INDEPENDENT_AMBULATORY_CARE_PROVIDER_SITE_OTHER): Payer: BC Managed Care – PPO

## 2022-10-05 ENCOUNTER — Ambulatory Visit
Admission: EM | Admit: 2022-10-05 | Discharge: 2022-10-05 | Disposition: A | Discharge status: Home / Self Care | Payer: BC Managed Care – PPO | Attending: Nurse Practitioner | Admitting: Nurse Practitioner

## 2022-10-05 DIAGNOSIS — S6991XA Unspecified injury of right wrist, hand and finger(s), initial encounter: Secondary | ICD-10-CM

## 2022-10-05 DIAGNOSIS — M79641 Pain in right hand: Secondary | ICD-10-CM

## 2022-10-05 DIAGNOSIS — M25531 Pain in right wrist: Secondary | ICD-10-CM | POA: Diagnosis not present

## 2022-10-05 MED ORDER — NAPROXEN 375 MG PO TABS: 375.0000 mg | ORAL_TABLET | Freq: Two times a day (BID) | ORAL | 0 refills | Status: AC

## 2022-10-05 NOTE — ED Triage Notes (Signed)
Patient presents to UC for right hand problem since 08/18/22. States pain increases with use of hand. Treating pain with ibuprofen.

## 2022-10-05 NOTE — ED Provider Notes (Signed)
[facility]    CSN: [id number] Arrival date & time: 10/05/22  1245      History   Chief Complaint Chief Complaint  Patient presents with   Hand Problem    Hurt my had on brick fireplace 2-3 weeks ago pain went away but reaggravated it about a week after my x-rays. Majority of pain comes for picking up everyday items. - Entered by patient    HPI Dominic Bryant is a 27 y.o. male presents for evaluation of hand pain.  Patient states 2 weeks ago while in [place] he punched a metal pole.  States had immediate pain and swelling of the dorsum of the hand that is continued since that time.  Denies any numbness or tingling or bruising.  He was seen in urgent care on 2/23 for right hand pain secondary to punching a brick wall.  Hand x-ray was negative at that time.  He states symptoms were improving until he reinjured the hand 2 weeks ago.  He has been taking ibuprofen with minimal relief.  Denies any wrist pain.  No other concerns at this time.  HPI  Past Medical History:  Diagnosis Date   Depression    Heart murmur    Thyroid disease     Patient Active Problem List   Diagnosis Date Noted   Severe episode of recurrent major depressive disorder, with psychotic features 09/28/2020   Encounter for general adult medical examination with abnormal findings 07/01/2020   Morbid obesity due to excess calories 07/01/2020   Acquired hypothyroidism 07/01/2020    Past Surgical History:  Procedure Laterality Date   ESOPHAGOGASTRODUODENOSCOPY (EGD) WITH PROPOFOL N/A 05/12/2015   Procedure: ESOPHAGOGASTRODUODENOSCOPY (EGD) WITH PROPOFOL;  Surgeon: Willis Modena, MD;  Location: [facility];  Service: Endoscopy;  Laterality: N/A;   EUS N/A 05/12/2015   Procedure: UPPER ENDOSCOPIC ULTRASOUND (EUS) RADIAL;  Surgeon: Willis Modena, MD;  Location: [facility];  Service: Endoscopy;  Laterality: N/A;       Home Medications    Prior to Admission medications   Medication Sig Start Date  End Date Taking? Authorizing Provider  naproxen (NAPROSYN) 375 MG tablet Take 1 tablet (375 mg total) by mouth 2 (two) times daily for 7 days. 10/05/22 10/12/22 Yes Radford Pax, NP  mirtazapine (REMERON) 7.5 MG tablet Take 1 tablet (7.5 mg total) by mouth at bedtime. 09/18/22   Etta Grandchild, MD  QUEtiapine Fumarate (SEROQUEL XR) 150 MG 24 hr tablet Take 1 tablet (150 mg total) by mouth at bedtime. 01/09/22   Etta Grandchild, MD  sertraline (ZOLOFT) 50 MG tablet Take 1 tablet (50 mg total) by mouth daily. 09/18/22   Etta Grandchild, MD  ARIPiprazole (ABILIFY) 5 MG tablet Take 5 mg by mouth daily.  06/07/20  [provider]  FLUoxetine (PROZAC) 10 MG tablet Take 1 tablet (10 mg total) by mouth daily. 09/10/19 06/07/20  Mardella Layman, MD    Family History Family History  Problem Relation Age of Onset   Crohn's disease Mother    Asthma Father    Alcohol abuse Father     Social History Social History   Tobacco Use   Smoking status: Every Day    Packs/day: 1    Types: Cigarettes   Smokeless tobacco: Never  Vaping Use   Vaping Use: Never used  Substance Use Topics   Alcohol use: Yes    Comment: social   Drug use: Yes    Types: Marijuana  Allergies   Patient has no known allergies.   Review of Systems Review of Systems  Musculoskeletal:        Right hand pain after punching pole     Physical Exam Triage Vital Signs ED Triage Vitals  Enc Vitals Group     BP 10/05/22 1401 112/76     Pulse Rate 10/05/22 1401 67     Resp 10/05/22 1401 18     Temp 10/05/22 1401 98.2 F (36.8 C)     Temp Source 10/05/22 1401 Oral     SpO2 10/05/22 1401 96 %     Weight --      Height --      Head Circumference --      Peak Flow --      Pain Score 10/05/22 1400 5     Pain Loc --      Pain Edu? --      Excl. in GC? --    No data found.  Updated Vital Signs BP 112/76 (BP Location: Left Arm)   Pulse 67   Temp 98.2 F (36.8 C) (Oral)   Resp 18   SpO2 96%   Visual  Acuity Right Eye Distance:   Left Eye Distance:   Bilateral Distance:    Right Eye Near:   Left Eye Near:    Bilateral Near:     Physical Exam Vitals and nursing note reviewed.  Constitutional:      Appearance: Normal appearance.  HENT:     Head: Normocephalic and atraumatic.  Eyes:     Pupils: Pupils are equal, round, and reactive to light.  Cardiovascular:     Rate and Rhythm: Normal rate.  Pulmonary:     Effort: Pulmonary effort is normal.  Musculoskeletal:       Hands:  Skin:    General: Skin is warm and dry.  Neurological:     General: No focal deficit present.     Mental Status: He is alert and oriented to person, place, and time.  Psychiatric:        Mood and Affect: Mood normal.        Behavior: Behavior normal.      UC Treatments / Results  Labs (all labs ordered are listed, but only abnormal results are displayed) Labs Reviewed - No data to display  EKG   Radiology DG Hand Complete Right  Result Date: 10/05/2022 CLINICAL DATA:  Punched a metal pole 2 weeks ago. Continued lateral right wrist pain. EXAM: RIGHT HAND - COMPLETE 3+ VIEW COMPARISON:  Right hand radiographs 08/18/2022 FINDINGS: Normal bone mineralization. Joint spaces are preserved. No acute fracture is seen. No dislocation. IMPRESSION: No acute fracture. Electronically Signed   By: Neita Garnetonald  Viola M.D.   On: 10/05/2022 14:23    Procedures Procedures (including critical care time)  Medications Ordered in UC Medications - No data to display  Initial Impression / Assessment and Plan / UC Course  I have reviewed the triage vital signs and the nursing notes.  Pertinent labs & imaging results that were available during my care of the patient were reviewed by me and considered in my medical decision making (see chart for details).     X-ray negative for fracture Discussed RICE therapy and Ace wrap applied in clinic Trial naproxen as patient reports ibuprofen is not beneficial PCP follow-up  if symptoms do not improve ER precautions reviewed and patient verbalized understanding Final Clinical Impressions(s) / UC Diagnoses   Final diagnoses:  Right  hand pain  Hand injury, right, initial encounter     Discharge Instructions      Your x-ray was negative for fracture Ace wrap to your hand as needed to help with swelling and support Naproxen twice daily for 7 days.  Take this with food Follow-up with your PCP if your symptoms do not improve Please go to the ER for any worsening symptoms     ED Prescriptions     Medication Sig Dispense Auth. Provider   naproxen (NAPROSYN) 375 MG tablet Take 1 tablet (375 mg total) by mouth 2 (two) times daily for 7 days. 14 tablet Radford Pax, NP      PDMP not reviewed this encounter.   Radford Pax, NP 10/05/22 1434

## 2022-10-05 NOTE — Discharge Instructions (Signed)
Your x-ray was negative for fracture Ace wrap to your hand as needed to help with swelling and support Naproxen twice daily for 7 days.  Take this with food Follow-up with your PCP if your symptoms do not improve Please go to the ER for any worsening symptoms

## 2022-10-09 ENCOUNTER — Encounter: Payer: Self-pay | Admitting: Internal Medicine

## 2022-10-09 ENCOUNTER — Ambulatory Visit (INDEPENDENT_AMBULATORY_CARE_PROVIDER_SITE_OTHER): Payer: BC Managed Care – PPO | Admitting: Internal Medicine

## 2022-10-09 VITALS — BP 108/62 | HR 72 | Temp 98.3°F | Ht 65.0 in | Wt 222.0 lb

## 2022-10-09 DIAGNOSIS — Z6836 Body mass index (BMI) 36.0-36.9, adult: Secondary | ICD-10-CM | POA: Diagnosis not present

## 2022-10-09 DIAGNOSIS — E039 Hypothyroidism, unspecified: Secondary | ICD-10-CM | POA: Diagnosis not present

## 2022-10-09 DIAGNOSIS — E66812 Obesity, class 2: Secondary | ICD-10-CM

## 2022-10-09 DIAGNOSIS — F324 Major depressive disorder, single episode, in partial remission: Secondary | ICD-10-CM | POA: Diagnosis not present

## 2022-10-09 DIAGNOSIS — F333 Major depressive disorder, recurrent, severe with psychotic symptoms: Secondary | ICD-10-CM

## 2022-10-09 LAB — CBC WITH DIFFERENTIAL/PLATELET
Basophils Absolute: 0 10*3/uL (ref 0.0–0.1)
Basophils Relative: 0.6 % (ref 0.0–3.0)
Eosinophils Absolute: 0.3 10*3/uL (ref 0.0–0.7)
Eosinophils Relative: 5.5 % — ABNORMAL HIGH (ref 0.0–5.0)
HCT: 50.9 % (ref 39.0–52.0)
Hemoglobin: 16.5 g/dL (ref 13.0–17.0)
Lymphocytes Relative: 27.3 % (ref 12.0–46.0)
Lymphs Abs: 1.5 10*3/uL (ref 0.7–4.0)
MCHC: 32.5 g/dL (ref 30.0–36.0)
MCV: 90.2 fl (ref 78.0–100.0)
Monocytes Absolute: 0.4 10*3/uL (ref 0.1–1.0)
Monocytes Relative: 7 % (ref 3.0–12.0)
Neutro Abs: 3.2 10*3/uL (ref 1.4–7.7)
Neutrophils Relative %: 59.6 % (ref 43.0–77.0)
Platelets: 189 10*3/uL (ref 150.0–400.0)
RBC: 5.65 Mil/uL (ref 4.22–5.81)
RDW: 13.3 % (ref 11.5–15.5)
WBC: 5.3 10*3/uL (ref 4.0–10.5)

## 2022-10-09 LAB — BASIC METABOLIC PANEL
BUN: 9 mg/dL (ref 6–23)
CO2: 26 mEq/L (ref 19–32)
Calcium: 9.1 mg/dL (ref 8.4–10.5)
Chloride: 107 mEq/L (ref 96–112)
Creatinine, Ser: 0.97 mg/dL (ref 0.40–1.50)
GFR: 108.39 mL/min (ref >60.00)
Glucose, Bld: 80 mg/dL (ref 70–99)
Potassium: 4 mEq/L (ref 3.5–5.1)
Sodium: 139 mEq/L (ref 135–145)

## 2022-10-09 LAB — HEPATIC FUNCTION PANEL
ALT: 15 U/L (ref 0–53)
AST: 22 U/L (ref 0–37)
Albumin: 4 g/dL (ref 3.5–5.2)
Alkaline Phosphatase: 37 U/L — ABNORMAL LOW (ref 39–117)
Bilirubin, Direct: 0.1 mg/dL (ref 0.0–0.3)
Total Bilirubin: 0.3 mg/dL (ref 0.2–1.2)
Total Protein: 6.4 g/dL (ref 6.0–8.3)

## 2022-10-09 LAB — TSH: TSH: 4.63 u[IU]/mL (ref 0.35–5.50)

## 2022-10-09 MED ORDER — QUETIAPINE FUMARATE ER 150 MG PO TB24: 150.0000 mg | ORAL_TABLET | Freq: Every day | ORAL | 1 refills | Status: DC

## 2022-10-09 NOTE — Progress Notes (Signed)
Subjective:  Patient ID: Dominic Bryant, male    DOB: [birth year]  Age: 27 y.o. MRN: [id number]  CC: Hypothyroidism and Depression   HPI Dominic Bryant presents for f/up ---  He got angry recently and punched a wall with his right hand.  X-rays were normal.  He denies pain or swelling.  Outpatient Medications Prior to Visit  Medication Sig Dispense Refill   naproxen (NAPROSYN) 375 MG tablet Take 1 tablet (375 mg total) by mouth 2 (two) times daily for 7 days. 14 tablet 0   mirtazapine (REMERON) 7.5 MG tablet Take 1 tablet (7.5 mg total) by mouth at bedtime. 30 tablet 0   QUEtiapine Fumarate (SEROQUEL XR) 150 MG 24 hr tablet Take 1 tablet (150 mg total) by mouth at bedtime. 90 tablet 1   sertraline (ZOLOFT) 50 MG tablet Take 1 tablet (50 mg total) by mouth daily. 30 tablet 0   No facility-administered medications prior to visit.    ROS Review of Systems  Constitutional: Negative.  Negative for diaphoresis and fatigue.  HENT: Negative.    Eyes: Negative.   Respiratory:  Negative for chest tightness, shortness of breath and wheezing.   Cardiovascular:  Negative for chest pain, palpitations and leg swelling.  Gastrointestinal:  Negative for abdominal pain, constipation, diarrhea, nausea and vomiting.  Endocrine: Negative.   Genitourinary: Negative.  Negative for difficulty urinating.  Musculoskeletal:  Negative for arthralgias and joint swelling.  Skin: Negative.  Negative for color change.  Neurological:  Negative for dizziness, weakness and light-headedness.  Hematological:  Negative for adenopathy. Does not bruise/bleed easily.  Psychiatric/Behavioral:  Positive for agitation, behavioral problems, decreased concentration and dysphoric mood. Negative for self-injury, sleep disturbance and suicidal ideas. The patient is not nervous/anxious and is not hyperactive.     Objective:  BP 108/62 (BP Location: Left Arm, Patient Position: Sitting, Cuff Size: Normal)   Pulse 72   Temp  98.3 F (36.8 C) (Oral)   Ht  (1.651 m)   Wt 222 lb (100.7 kg)   SpO2 96%   BMI 36.94 kg/m   BP Readings from Last 3 Encounters:  10/09/22 108/62  10/05/22 112/76  08/18/22 101/63    Wt Readings from Last 3 Encounters:  10/09/22 222 lb (100.7 kg)  07/03/22 231 lb (104.8 kg)  01/09/22 231 lb (104.8 kg)    Physical Exam Vitals reviewed.  Constitutional:      Appearance: He is obese. He is not ill-appearing.  HENT:     Nose: Nose normal.  Eyes:     General: No scleral icterus.    Conjunctiva/sclera: Conjunctivae normal.  Cardiovascular:     Rate and Rhythm: Normal rate and regular rhythm.     Heart sounds: No murmur heard. Pulmonary:     Effort: Pulmonary effort is normal.     Breath sounds: No stridor. No wheezing, rhonchi or rales.  Abdominal:     General: Abdomen is flat.     Palpations: There is no mass.     Tenderness: There is no abdominal tenderness. There is no guarding.     Hernia: No hernia is present.  Musculoskeletal:        General: Normal range of motion.     Right hand: Normal. No swelling, deformity or bony tenderness. Normal range of motion.     Left hand: Normal.     Cervical back: Neck supple.     Right lower leg: No edema.     Left lower leg: No  edema.  Lymphadenopathy:     Cervical: No cervical adenopathy.  Skin:    General: Skin is warm and dry.  Neurological:     General: No focal deficit present.     Mental Status: He is alert. Mental status is at baseline.  Psychiatric:        Attention and Perception: Attention and perception normal.        Mood and Affect: Mood is not anxious. Affect is angry. Affect is not labile or tearful.        Speech: Speech normal.        Behavior: Behavior normal. Behavior is not agitated, slowed, aggressive, withdrawn or hyperactive.        Thought Content: Thought content normal. Thought content is not paranoid or delusional. Thought content does not include homicidal or suicidal ideation. Thought  content does not include homicidal or suicidal plan.        Cognition and Memory: Cognition normal.     Lab Results  Component Value Date   WBC 5.3 10/09/2022   HGB 16.5 10/09/2022   HCT 50.9 10/09/2022   PLT 189.0 10/09/2022   GLUCOSE 80 10/09/2022   CHOL 189 07/01/2020   TRIG 91.0 07/01/2020   HDL 40.50 07/01/2020   LDLCALC 131 (H) 07/01/2020   ALT 15 10/09/2022   AST 22 10/09/2022   NA 139 10/09/2022   K 4.0 10/09/2022   CL 107 10/09/2022   CREATININE 0.97 10/09/2022   BUN 9 10/09/2022   CO2 26 10/09/2022   TSH 4.63 10/09/2022   HGBA1C 4.6 07/01/2020    DG Hand Complete Right  Result Date: 10/05/2022 CLINICAL DATA:  Punched a metal pole 2 weeks ago. Continued lateral right wrist pain. EXAM: RIGHT HAND - COMPLETE 3+ VIEW COMPARISON:  Right hand radiographs 08/18/2022 FINDINGS: Normal bone mineralization. Joint spaces are preserved. No acute fracture is seen. No dislocation. IMPRESSION: No acute fracture. Electronically Signed   By: Neita Garnet M.D.   On: 10/05/2022 14:23    Assessment & Plan:   Acquired hypothyroidism- He is euthyroid. -     Basic metabolic panel; Future -     CBC with Differential/Platelet; Future -     Hepatic function panel; Future -     TSH; Future  Major depressive disorder with single episode, in partial remission -     QUEtiapine Fumarate ER; Take 1 tablet (150 mg total) by mouth at bedtime.  Dispense: 90 tablet; Refill: 1  Severe episode of recurrent major depressive disorder, with psychotic features -     Basic metabolic panel; Future -     CBC with Differential/Platelet; Future -     Hepatic function panel; Future -     TSH; Future -     Sertraline HCl; Take 1 tablet (50 mg total) by mouth daily.  Dispense: 90 tablet; Refill: 1  Class 2 severe obesity due to excess calories with serious comorbidity and body mass index (BMI) of 36.0 to 36.9 in adult - Labs are negative for causes/complications.     Follow-up: Return in about 6  months (around 04/10/2023).  Sanda Linger, MD

## 2022-10-09 NOTE — Patient Instructions (Signed)
Hand Pain Hand pain can make it hard to do daily activities. Many things can cause hand pain. Some common causes are: Injuries. These may include: Broken bones (fractures)and cuts. Overuse injuries from doing the same movements many times (repetitive activity). Arthritis. Lumps in the tendons or joints of the hand and wrist (ganglion cysts). Nerve compression syndromes (carpal tunnel syndrome). Inflammation of the tendons (tendinitis). Infection. Follow these instructions at home: Managing pain, stiffness, and swelling     Take over-the-counter and prescription medicines only as told by your health care provider. If told, put ice on the affected area. Put ice in a plastic bag. Place a towel between your skin and the bag. Leave the ice on for 20 minutes, 2-3 times a day. If told, apply heat to the affected area before you exercise or as often as told by your provider. Use the heat source that your provider recommends, such as a moist heat pack or a heating pad. Place a towel between your skin and the heat source. Leave the heat on for 20-30 minutes. If your skin turns bright red, remove the ice or heat right away to prevent skin damage. The risk of damage is higher if you cannot feel pain, heat, or cold. Activity Take breaks from repetitive activity often. Minimize stress on your hands and wrists as much as possible. Do stretches or exercises as told by your provider. Do not do activities that make your pain worse. Wear a hand splint or support as told by your provider. Contact a health care provider if: Your pain does not get better after a few days. Your pain gets worse. Your pain affects your ability to do your daily activities. Your hand becomes warm, red, or swollen. Your hand is numb or tingling. Get help right away if: Your hand is extremely swollen or is an unusual shape. Your hand or fingers turn white or blue. You cannot move your hand, wrist, or fingers. This  information is not intended to replace advice given to you by your health care provider. Make sure you discuss any questions you have with your health care provider. Document Revised: 01/18/2022 Document Reviewed: 01/18/2022 Elsevier Patient Education  2023 Elsevier Inc.  

## 2022-10-10 ENCOUNTER — Other Ambulatory Visit (HOSPITAL_COMMUNITY): Payer: Self-pay

## 2022-10-12 ENCOUNTER — Telehealth: Payer: Self-pay

## 2022-10-12 NOTE — Telephone Encounter (Signed)
[phone] ext [phone] Aladnah from [employer] has called in regards to the pts disability paperwork that was sent in November of 2023. She is asking for a call back from [employer].

## 2022-10-13 MED ORDER — SERTRALINE HCL 50 MG PO TABS: 50.0000 mg | ORAL_TABLET | Freq: Every day | ORAL | 1 refills | Status: DC

## 2022-10-13 NOTE — Telephone Encounter (Signed)
Dominic Bryant has been informed that documents were received on 4/18. I do not see anything scanned in pt chart in regard in 04/2022.  I informed her that documents would be sent to our medical record dept that is off site as they are requesting all records from 2020-2024.

## 2022-10-26 ENCOUNTER — Encounter: Payer: Self-pay | Admitting: Internal Medicine

## 2022-12-19 ENCOUNTER — Ambulatory Visit: Payer: BC Managed Care – PPO

## 2023-02-18 IMAGING — DX DG CHEST 2V
2 series · 2 of 2 positions shown · non-contrast
Comparison: None

CLINICAL DATA: Cough, chest pain and chest congestion in a
25-year-old male.

EXAM:
CHEST - 2 VIEW

[chest pa]
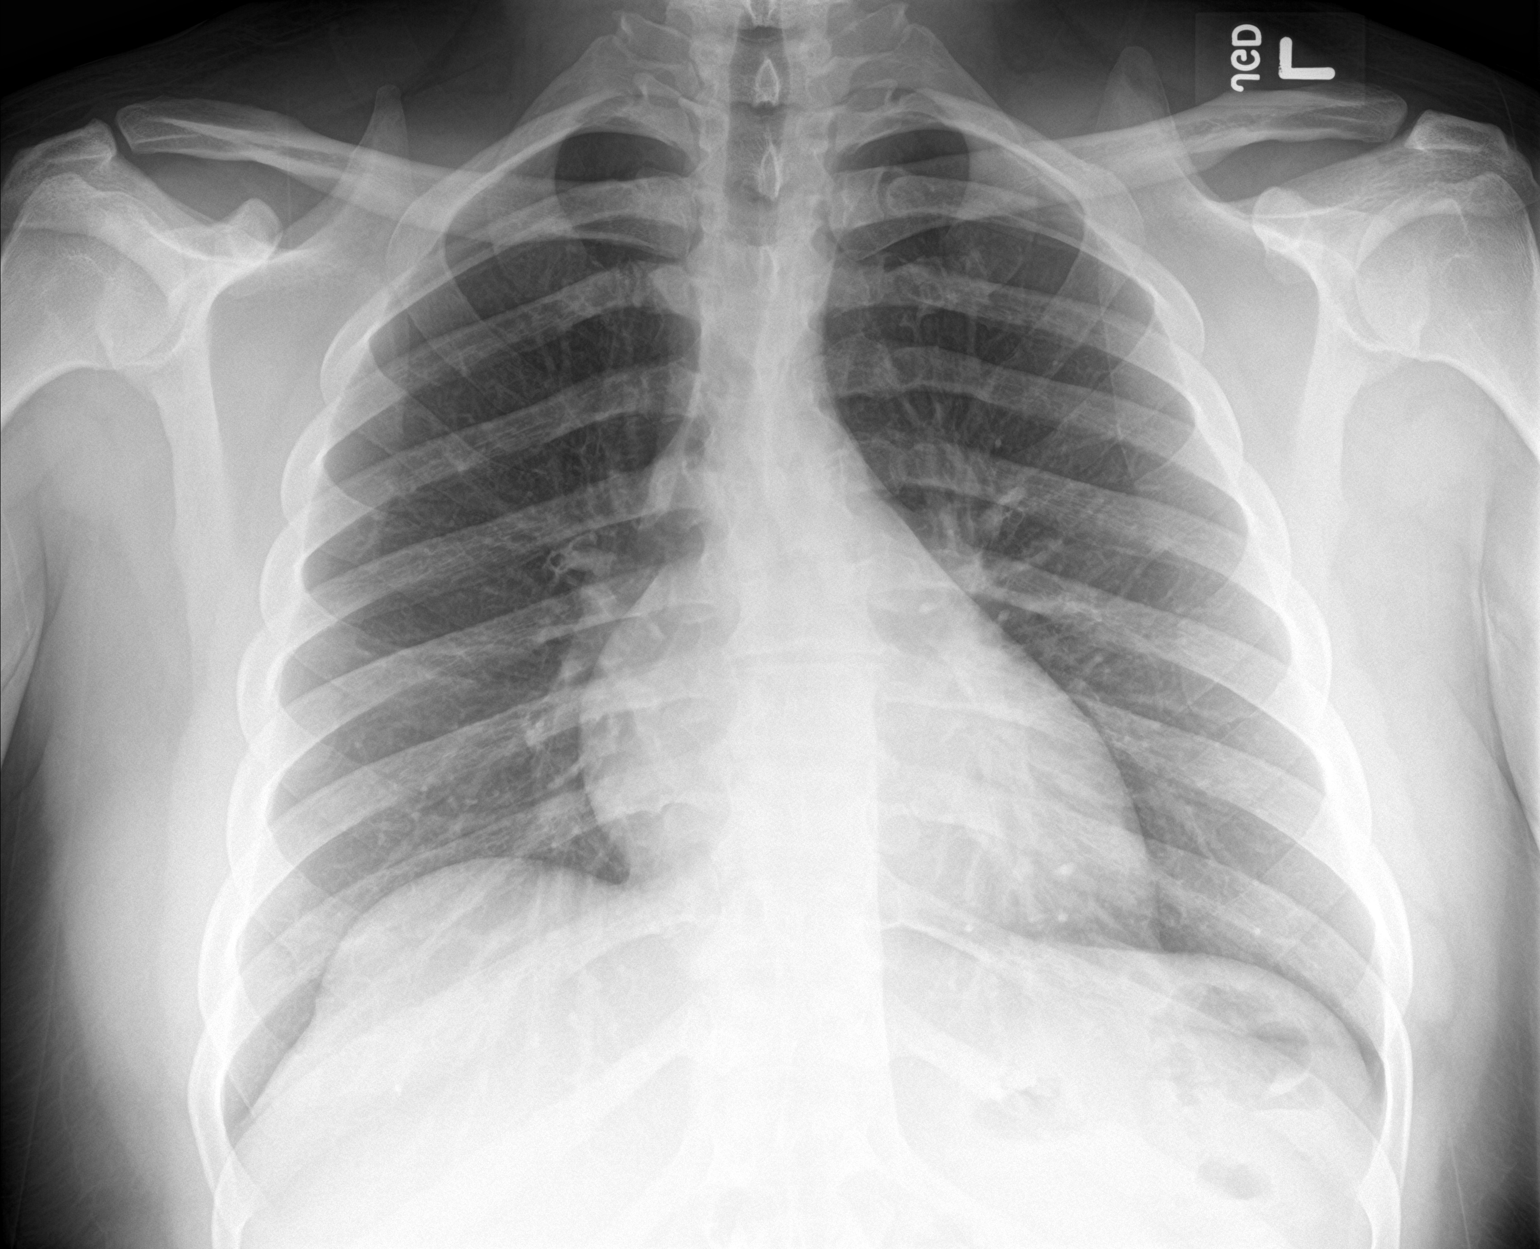

[chest lat]
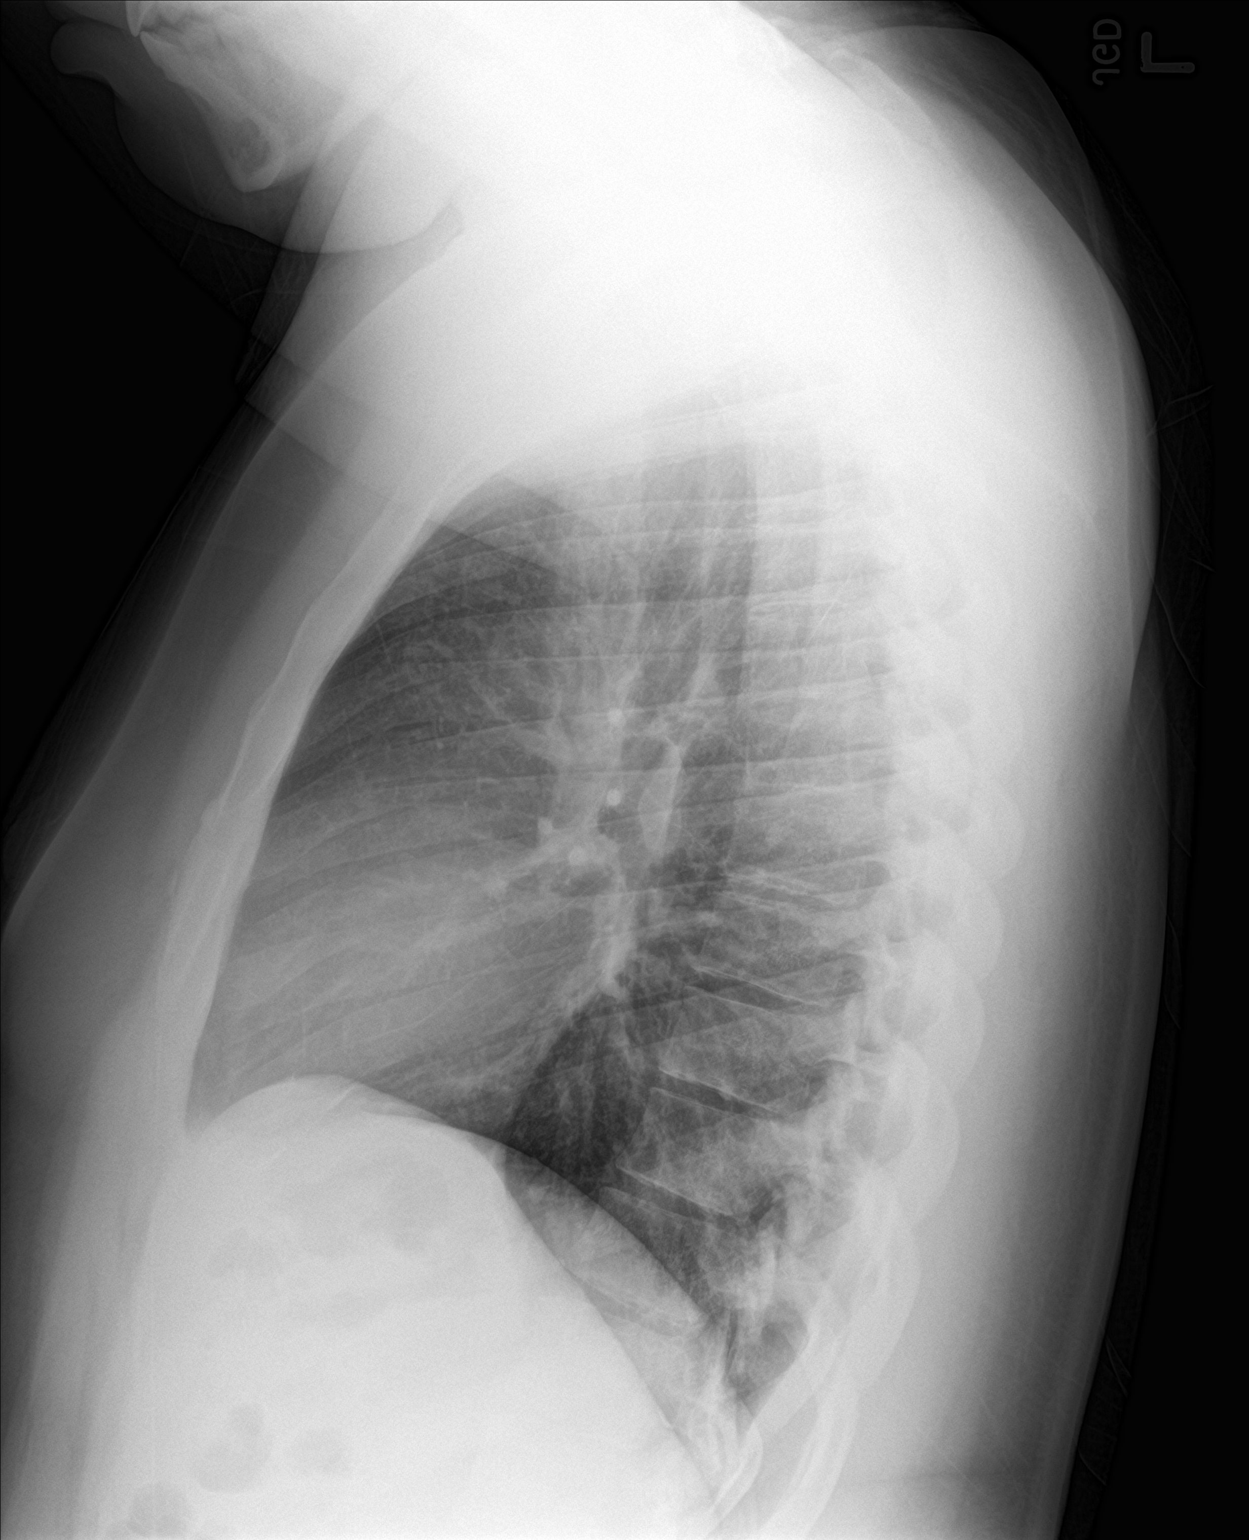

[2 of 2 positions shown; findings below may reference images not displayed]

FINDINGS: Trachea midline.

Cardiomediastinal contours and hilar structures are stable.

Lungs are clear.

No pleural effusion or visible pneumothorax.

On limited assessment there is no acute skeletal process.
IMPRESSION: No acute cardiopulmonary disease.

## 2023-02-19 ENCOUNTER — Ambulatory Visit: Payer: BC Managed Care – PPO | Admitting: Internal Medicine

## 2023-03-11 ENCOUNTER — Emergency Department (HOSPITAL_COMMUNITY)
Admission: EM | Admit: 2023-03-11 | Discharge: 2023-03-20 | Disposition: A | Discharge status: Home / Self Care | Payer: No Typology Code available for payment source | Attending: Emergency Medicine | Admitting: Emergency Medicine

## 2023-03-11 ENCOUNTER — Emergency Department (HOSPITAL_COMMUNITY): Payer: No Typology Code available for payment source

## 2023-03-11 ENCOUNTER — Other Ambulatory Visit: Payer: Self-pay

## 2023-03-11 DIAGNOSIS — S32010A Wedge compression fracture of first lumbar vertebra, initial encounter for closed fracture: Secondary | ICD-10-CM | POA: Insufficient documentation

## 2023-03-11 DIAGNOSIS — Z79899 Other long term (current) drug therapy: Secondary | ICD-10-CM | POA: Diagnosis not present

## 2023-03-11 DIAGNOSIS — W131XXA Fall from, out of or through bridge, initial encounter: Secondary | ICD-10-CM | POA: Diagnosis not present

## 2023-03-11 DIAGNOSIS — F332 Major depressive disorder, recurrent severe without psychotic features: Secondary | ICD-10-CM | POA: Diagnosis present

## 2023-03-11 DIAGNOSIS — S52025A Nondisplaced fracture of olecranon process without intraarticular extension of left ulna, initial encounter for closed fracture: Secondary | ICD-10-CM | POA: Diagnosis not present

## 2023-03-11 DIAGNOSIS — X838XXA Intentional self-harm by other specified means, initial encounter: Secondary | ICD-10-CM | POA: Insufficient documentation

## 2023-03-11 DIAGNOSIS — F17210 Nicotine dependence, cigarettes, uncomplicated: Secondary | ICD-10-CM | POA: Insufficient documentation

## 2023-03-11 DIAGNOSIS — T1491XA Suicide attempt, initial encounter: Secondary | ICD-10-CM

## 2023-03-11 DIAGNOSIS — D72829 Elevated white blood cell count, unspecified: Secondary | ICD-10-CM | POA: Diagnosis not present

## 2023-03-11 DIAGNOSIS — S52022A Displaced fracture of olecranon process without intraarticular extension of left ulna, initial encounter for closed fracture: Secondary | ICD-10-CM

## 2023-03-11 DIAGNOSIS — W19XXXA Unspecified fall, initial encounter: Secondary | ICD-10-CM

## 2023-03-11 DIAGNOSIS — S32020A Wedge compression fracture of second lumbar vertebra, initial encounter for closed fracture: Secondary | ICD-10-CM | POA: Insufficient documentation

## 2023-03-11 DIAGNOSIS — S32030A Wedge compression fracture of third lumbar vertebra, initial encounter for closed fracture: Secondary | ICD-10-CM | POA: Insufficient documentation

## 2023-03-11 HISTORY — DX: Suicide attempt, initial encounter: T14.91XA

## 2023-03-11 HISTORY — DX: Major depressive disorder, single episode, unspecified: F32.9

## 2023-03-11 LAB — CBC
HCT: 49.8 % (ref 39.0–52.0)
Hemoglobin: 15.9 g/dL (ref 13.0–17.0)
MCH: 29.2 pg (ref 26.0–34.0)
MCHC: 31.9 g/dL (ref 30.0–36.0)
MCV: 91.5 fL (ref 80.0–100.0)
Platelets: 308 10*3/uL (ref 150–400)
RBC: 5.44 MIL/uL (ref 4.22–5.81)
RDW: 12.8 % (ref 11.5–15.5)
WBC: 12.9 10*3/uL — ABNORMAL HIGH (ref 4.0–10.5)
nRBC: 0 % (ref 0.0–0.2)

## 2023-03-11 LAB — SALICYLATE LEVEL: Salicylate Lvl: <7 mg/dL — ABNORMAL LOW (ref 7.0–30.0)

## 2023-03-11 LAB — I-STAT CHEM 8, ED
BUN: 9 mg/dL (ref 6–20)
Calcium, Ion: 1.15 mmol/L (ref 1.15–1.40)
Chloride: 108 mmol/L (ref 98–111)
Creatinine, Ser: 1.3 mg/dL — ABNORMAL HIGH (ref 0.61–1.24)
Glucose, Bld: 121 mg/dL — ABNORMAL HIGH (ref 70–99)
HCT: 51 % (ref 39.0–52.0)
Hemoglobin: 17.3 g/dL — ABNORMAL HIGH (ref 13.0–17.0)
Potassium: 3.2 mmol/L — ABNORMAL LOW (ref 3.5–5.1)
Sodium: 142 mmol/L (ref 135–145)
TCO2: 21 mmol/L — ABNORMAL LOW (ref 22–32)

## 2023-03-11 LAB — COMPREHENSIVE METABOLIC PANEL
ALT: 25 U/L (ref 0–44)
AST: 48 U/L — ABNORMAL HIGH (ref 15–41)
Albumin: 3.3 g/dL — ABNORMAL LOW (ref 3.5–5.0)
Alkaline Phosphatase: 43 U/L (ref 38–126)
Anion gap: 17 — ABNORMAL HIGH (ref 5–15)
BUN: 9 mg/dL (ref 6–20)
CO2: 20 mmol/L — ABNORMAL LOW (ref 22–32)
Calcium: 9.3 mg/dL (ref 8.9–10.3)
Chloride: 105 mmol/L (ref 98–111)
Creatinine, Ser: 1.4 mg/dL — ABNORMAL HIGH (ref 0.61–1.24)
GFR, Estimated: >60 mL/min (ref >60)
Glucose, Bld: 130 mg/dL — ABNORMAL HIGH (ref 70–99)
Potassium: 3.2 mmol/L — ABNORMAL LOW (ref 3.5–5.1)
Sodium: 142 mmol/L (ref 135–145)
Total Bilirubin: 0.6 mg/dL (ref 0.3–1.2)
Total Protein: 5.9 g/dL — ABNORMAL LOW (ref 6.5–8.1)

## 2023-03-11 LAB — MAGNESIUM: Magnesium: 1.7 mg/dL (ref 1.7–2.4)

## 2023-03-11 LAB — CK: Total CK: 329 U/L (ref 49–397)

## 2023-03-11 LAB — ACETAMINOPHEN LEVEL: Acetaminophen (Tylenol), Serum: <10 ug/mL — ABNORMAL LOW (ref 10–30)

## 2023-03-11 LAB — I-STAT CG4 LACTIC ACID, ED: Lactic Acid, Venous: 3.7 mmol/L (ref 0.5–1.9)

## 2023-03-11 LAB — ETHANOL: Alcohol, Ethyl (B): <10 mg/dL (ref <10)

## 2023-03-11 MED ORDER — QUETIAPINE FUMARATE ER 50 MG PO TB24
150.0000 mg | ORAL_TABLET | Freq: Every day | ORAL | Status: DC
  Administered 2023-03-12 – 2023-03-19 (×9): 150 mg via ORAL
  Filled 2023-03-11 (×10): qty 3

## 2023-03-11 MED ORDER — SERTRALINE HCL 50 MG PO TABS
50.0000 mg | ORAL_TABLET | Freq: Every day | ORAL | Status: DC
  Administered 2023-03-12: 50 mg via ORAL
  Filled 2023-03-11: qty 1

## 2023-03-11 MED ORDER — ONDANSETRON 4 MG PO TBDP
4.0000 mg | ORAL_TABLET | Freq: Three times a day (TID) | ORAL | Status: DC | PRN
  Administered 2023-03-12: 4 mg via ORAL
  Filled 2023-03-11 (×3): qty 1

## 2023-03-11 MED ORDER — IOHEXOL 350 MG/ML SOLN
75.0000 mL | Freq: Once | INTRAVENOUS | Status: AC | PRN
  Administered 2023-03-11: 75 mL via INTRAVENOUS

## 2023-03-11 MED ORDER — METHOCARBAMOL 500 MG PO TABS: 500.0000 mg | ORAL_TABLET | Freq: Four times a day (QID) | ORAL | Status: DC

## 2023-03-11 MED ORDER — METHOCARBAMOL 500 MG PO TABS
500.0000 mg | ORAL_TABLET | Freq: Four times a day (QID) | ORAL | Status: AC
  Administered 2023-03-12 – 2023-03-14 (×11): 500 mg via ORAL
  Filled 2023-03-11 (×12): qty 1

## 2023-03-11 MED ORDER — IBUPROFEN 400 MG PO TABS
600.0000 mg | ORAL_TABLET | Freq: Four times a day (QID) | ORAL | Status: AC
  Administered 2023-03-11 – 2023-03-14 (×11): 600 mg via ORAL
  Filled 2023-03-11 (×11): qty 1

## 2023-03-11 MED ORDER — LACTATED RINGERS IV BOLUS
1000.0000 mL | Freq: Once | INTRAVENOUS | Status: AC
  Administered 2023-03-11: 1000 mL via INTRAVENOUS

## 2023-03-11 MED ORDER — ONDANSETRON HCL 4 MG/2ML IJ SOLN
4.0000 mg | Freq: Once | INTRAMUSCULAR | Status: AC
  Administered 2023-03-11: 4 mg via INTRAVENOUS
  Filled 2023-03-11: qty 2

## 2023-03-11 MED ORDER — HYDROMORPHONE HCL 1 MG/ML IJ SOLN
1.0000 mg | Freq: Once | INTRAMUSCULAR | Status: AC
  Administered 2023-03-11: 1 mg via INTRAVENOUS
  Filled 2023-03-11 (×2): qty 1

## 2023-03-11 MED ORDER — LIDOCAINE 5 % EX PTCH
1.0000 | MEDICATED_PATCH | CUTANEOUS | Status: DC
  Administered 2023-03-11 – 2023-03-19 (×9): 1 via TRANSDERMAL
  Filled 2023-03-11 (×9): qty 1

## 2023-03-11 MED ORDER — KETOROLAC TROMETHAMINE 15 MG/ML IJ SOLN
15.0000 mg | Freq: Once | INTRAMUSCULAR | Status: AC
  Administered 2023-03-11: 15 mg via INTRAVENOUS
  Filled 2023-03-11: qty 1

## 2023-03-11 MED ORDER — METHOCARBAMOL 500 MG PO TABS
1000.0000 mg | ORAL_TABLET | Freq: Once | ORAL | Status: AC
  Administered 2023-03-11: 1000 mg via ORAL
  Filled 2023-03-11: qty 2

## 2023-03-11 MED ORDER — OXYCODONE-ACETAMINOPHEN 5-325 MG PO TABS
1.0000 | ORAL_TABLET | ORAL | Status: DC | PRN
  Administered 2023-03-12 – 2023-03-18 (×13): 1 via ORAL
  Filled 2023-03-11 (×14): qty 1

## 2023-03-11 NOTE — ED Triage Notes (Signed)
PT arrives via EMS after jumping approximately 15-20 feet off a bridge. Pt states it was a suicide attempt. PT is AxOx4. Arrives in C-collar and spinal board. Pt c/o pain to lower back and legs. Cms intact. No LOC and no blood thinners.

## 2023-03-11 NOTE — Discharge Instructions (Addendum)
You have fractures in your lower spine, L1-3.  Wear brace when out of bed to minimize pain with movements.  Follow-up with neurosurgery.  Telephone number is below.  You also have a fracture in your left elbow.  You will need to follow-up with orthopedic surgery about this.  Telephone number is below.  COUNSELING AGENCIES in [facility])  [facility] [address] [place].  [phone]  [facility] Counseling [address] [place].    [phone]  Individual and [facility] Therapists [address] [place].    [phone]   Habla Espaol/Interprete  [facility] [address].    [phone]  [facility] [address]  "[place]"    [phone]   [facility] [address] [place].     [phone] [facility] (SEL) [address] [place].  [phone]  Psychiatric services/servicios psiquiatricos  [facility] [address] [place] [place]   [phone] [facility] [address]     [phone]  [facility] [address]      [phone]   Habla Espaol/Interprete & Psychiatric services/servicios psiquiatricos  [facility] [address]  [phone]  [facility] [address] [place].     [phone]  [facility] [address]    [phone] Psychotherapeutic Services [address] (26yo & over only)     [phone]    [phone]  Provides information on mental health, intellectual/developmental disabilities & substance abuse services in [facility] Health Resources:  Intensive Outpatient Programs: [facility]      [address] [place], [place] [phone] Both a day and evening program       [facility] Outpatient     [address]        [place], [place] [address] [phone]         ADS: [facility] Sv[facility] [address] [place] [phone]  [facility] Mental Health ACCESS LINE: [phone] or [phone] [address] [place], [place] [address] [email or web address]   Substance Abuse Resources: [facility] and Drug Services  [phone] [facility] [phone] [facility] [phone] Floydene Flock [phone] Residential & Outpatient Substance Abuse Program  [phone]  Psychological Services: [facility] Health  [phone] [facility])  [phone] [facility], [phone] [place]. [address] [place], ACCESS LINE: [phone] or [phone], [email or web address]  Mobile Crisis Teams:                                        Therapeutic Alternatives         Mobile Crisis Care Unit [phone]             [facility] [address] Ginette Otto [phone]                                         Interventionist [address] [place] [place] [phone]  Self-Help/Support Groups: Mental Health Assoc[facility] of [employer] of support groups [phone] (call for more info)  Narcotics Anonymous (NA) Caring Services [address] [place] -  2 meetings at this location  Residential Treatment Programs:  [facility]      [address]        [place]       [phone]         [facility] [address] [place] [address] [place], [place]  [address] [phone]  [facility]  [address] [place], [place] [address] [phone] Admissions: 8am-3pm M-F  [facility]     [address]        [place], [place] [address]       [phone]         [facility] [address] [place], [place] [phone]  [facility] [address] [place], [place] [phone]  [facility] - Intensive Outpatient      [address]     [place], [place]       [phone]         [facility] (Addiction Recovery Care [facility]     [address] [place], [place] [phone] or [phone]  Residential Treatment Services (RTS), Medicaid [address] [place], [place] [phone]  [facility] [address]                                               [address] [place] [phone]  [facility] Resources: [phone]               General Therapy                                                Angie Fava, PhD        [address] [place], [place] [address]         [phone]   Insurance  [facility]   [address] [place], [place] [address] [phone]  [facility] [address] [place], [place] [address] [phone] Insurance/Medicaid/sponsorship through [facility] and Families                                              [address]                                        [place], [place] [address]    Therapy/tele-psych/case         [phone]          [facility] [address] [place]  [address]  Adolescent/group home/case management [phone]  Creola Corn PhD       General therapy       Insurance   [phone]         Dr. Lolly Mustache, Insurance, M-F [phone]  [facility] [place]  [employer] [facility] Dept. [address]                 [address]         [address] [place] [address]  Blondell Reveal Phone:  [phone]                                  Phone:  [phone]                   Phone:  [phone]  [facility], [phone] [facility]  [facility]- [phone]       -     [facility] in [place], [address]             [phone], Insurance

## 2023-03-11 NOTE — BH Assessment (Signed)
Clinician messaged [name], [name], RN: "Hey. It's Trey with [facility]TS. Is the pt able to engage in the assessment, if so the pt will need to be placed in a private room. Is the pt under IVC? Also is the pt medically cleared?"    Redmond Pulling, MS, [facility], [facility] Triage Specialist [phone]

## 2023-03-11 NOTE — ED Notes (Signed)
I called staffing and they said they dont have any sitters at this time.

## 2023-03-11 NOTE — ED Provider Notes (Addendum)
[place] EMERGENCY DEPARTMENT AT [facility] Provider Note   CSN: [id number] Arrival date & time: 03/11/23  1831     History  Chief Complaint  Patient presents with   Trauma   Suicide Attempt    Dominic Bryant is a 27 y.o. male.   Trauma   Current symptoms:      Associated symptoms:            Reports back pain.   Patient presents after fall.  Shortly prior to arrival, he intentionally jumped off of a bridge onto a road.  Estimated height fall was 15 feet.  Per bystanders passing by, he did land on his feet.  EMS was called to the scene.  Patient has had reported increased ankle social stressors in his life lately.  He stated that the jump was a spiral moment idea.  Per chart review, his medical history includes depression.  Home medications include Zoloft and Seroquel.  He is not on a blood thinner.  He endorses pain in his lower back only.  Pain is severe at this time.     Home Medications Prior to Admission medications   Medication Sig Start Date End Date Taking? Authorizing Provider  QUEtiapine Fumarate (SEROQUEL XR) 150 MG 24 hr tablet Take 1 tablet (150 mg total) by mouth at bedtime. 10/09/22  Yes Etta Grandchild, MD  sertraline (ZOLOFT) 100 MG tablet Take 150 mg by mouth daily. 10/24/22  Yes [provider]  ARIPiprazole (ABILIFY) 5 MG tablet Take 5 mg by mouth daily.  06/07/20  [provider]  FLUoxetine (PROZAC) 10 MG tablet Take 1 tablet (10 mg total) by mouth daily. 09/10/19 06/07/20  Mardella Layman, MD      Allergies    Patient has no known allergies.    Review of Systems   Review of Systems  Musculoskeletal:  Positive for back pain.  Psychiatric/Behavioral:  Positive for self-injury and suicidal ideas.   All other systems reviewed and are negative.   Physical Exam Updated Vital Signs BP 118/73   Pulse 81   Temp 99 F (37.2 C) (Oral)   Resp 16   Ht 5\' 7"  (1.702 m)   Wt 99.8 kg   SpO2 97%   BMI 34.46 kg/m  Physical  Exam Vitals and nursing note reviewed.  Constitutional:      General: He is not in acute distress.    Appearance: Normal appearance. He is well-developed. He is not ill-appearing, toxic-appearing or diaphoretic.  HENT:     Head: Normocephalic and atraumatic.     Right Ear: External ear normal.     Left Ear: External ear normal.     Nose: Nose normal.     Mouth/Throat:     Mouth: Mucous membranes are moist.  Eyes:     Extraocular Movements: Extraocular movements intact.     Conjunctiva/sclera: Conjunctivae normal.  Cardiovascular:     Rate and Rhythm: Normal rate and regular rhythm.     Heart sounds: No murmur heard. Pulmonary:     Effort: Pulmonary effort is normal. No respiratory distress.     Breath sounds: Normal breath sounds. No wheezing or rales.  Chest:     Chest wall: No tenderness.  Abdominal:     General: There is no distension.     Palpations: Abdomen is soft.     Tenderness: There is no abdominal tenderness.  Musculoskeletal:        General: Swelling, tenderness and signs of injury  present. No deformity. Normal range of motion.     Cervical back: Normal range of motion and neck supple.     Right lower leg: No edema.     Left lower leg: No edema.  Skin:    General: Skin is warm and dry.     Coloration: Skin is not jaundiced or pale.     Comments: Skin tear to left elbow.  Neurological:     General: No focal deficit present.     Mental Status: He is alert and oriented to person, place, and time.  Psychiatric:        Mood and Affect: Mood is depressed.        Speech: Speech normal.        Behavior: Behavior normal. Behavior is cooperative.        Thought Content: Thought content includes suicidal ideation. Thought content includes suicidal plan.     ED Results / Procedures / Treatments   Labs (all labs ordered are listed, but only abnormal results are displayed) Labs Reviewed  COMPREHENSIVE METABOLIC PANEL - Abnormal; Notable for the following components:       Result Value   Potassium 3.2 (*)    CO2 20 (*)    Glucose, Bld 130 (*)    Creatinine, Ser 1.40 (*)    Total Protein 5.9 (*)    Albumin 3.3 (*)    AST 48 (*)    Anion gap 17 (*)    All other components within normal limits  CBC - Abnormal; Notable for the following components:   WBC 12.9 (*)    All other components within normal limits  SALICYLATE LEVEL - Abnormal; Notable for the following components:   Salicylate Lvl <7.0 (*)    All other components within normal limits  ACETAMINOPHEN LEVEL - Abnormal; Notable for the following components:   Acetaminophen (Tylenol), Serum <10 (*)    All other components within normal limits  I-STAT CHEM 8, ED - Abnormal; Notable for the following components:   Potassium 3.2 (*)    Creatinine, Ser 1.30 (*)    Glucose, Bld 121 (*)    TCO2 21 (*)    Hemoglobin 17.3 (*)    All other components within normal limits  I-STAT CG4 LACTIC ACID, ED - Abnormal; Notable for the following components:   Lactic Acid, Venous 3.7 (*)    All other components within normal limits  ETHANOL  MAGNESIUM  CK  URINALYSIS, ROUTINE W REFLEX MICROSCOPIC  RAPID URINE DRUG SCREEN, HOSP PERFORMED    EKG None  Radiology DG Elbow 2 Views Left  Result Date: 03/11/2023 CLINICAL DATA:  Jumped 15-20 feet, abrasions, pain EXAM: LEFT ELBOW - 2 VIEW COMPARISON:  None Available. FINDINGS: Frontal and lateral views of the left elbow are obtained. There is a comminuted intra-articular fracture through the olecranon, with approximately 4 mm of separation of the fracture fragments. No evidence of dislocation. Small left elbow effusion. Prominent dorsal soft tissue swelling. IMPRESSION: 1. Minimally comminuted and displaced intra-articular olecranon fracture, with overlying soft tissue swelling. Electronically Signed   By: Sharlet Salina M.D.   On: 03/11/2023 20:17   CT L-SPINE NO CHARGE  Result Date: 03/11/2023 CLINICAL DATA:  Level 2 trauma, jumped from 10 foot bridge EXAM:  CT Thoracic and Lumbar spine with contrast TECHNIQUE: Multiplanar CT images of the thoracic and lumbar spine were reconstructed from contemporary CT of the Chest, Abdomen, and Pelvis. RADIATION DOSE REDUCTION: This exam was performed according to the departmental  dose-optimization program which includes automated exposure control, adjustment of the mA and/or kV according to patient size and/or use of iterative reconstruction technique. CONTRAST:  No additional COMPARISON:  Concurrent CT chest abdomen pelvis FINDINGS: CT THORACIC SPINE FINDINGS Alignment: Normal thoracic kyphosis. Vertebrae: No acute fracture or focal pathologic process. Paraspinal and other soft tissues: Evaluated on dedicated CT chest. Disc levels: Intervertebral disc spaces are maintained. Spinal canal is patent. CT LUMBAR SPINE FINDINGS Segmentation: 5 lumbar type vertebral bodies. Alignment: Normal lumbar lordosis. Vertebrae: Mild superior endplate compression fracture deformities at L1-3, with approximately 10% loss of height. No retropulsion. Paraspinal and other soft tissues: Evaluated on dedicated CT abdomen/pelvis. Disc levels: Intervertebral disc spaces are maintained. Spinal canal is patent. IMPRESSION: Mild superior endplate compression fracture deformities at L1-3, with approximately 10% loss of height. No retropulsion. Normal thoracic spine CT. Electronically Signed   By: Charline Bills M.D.   On: 03/11/2023 19:36   CT T-SPINE NO CHARGE  Result Date: 03/11/2023 CLINICAL DATA:  Level 2 trauma, jumped from 10 foot bridge EXAM: CT Thoracic and Lumbar spine with contrast TECHNIQUE: Multiplanar CT images of the thoracic and lumbar spine were reconstructed from contemporary CT of the Chest, Abdomen, and Pelvis. RADIATION DOSE REDUCTION: This exam was performed according to the departmental dose-optimization program which includes automated exposure control, adjustment of the mA and/or kV according to patient size and/or use of  iterative reconstruction technique. CONTRAST:  No additional COMPARISON:  Concurrent CT chest abdomen pelvis FINDINGS: CT THORACIC SPINE FINDINGS Alignment: Normal thoracic kyphosis. Vertebrae: No acute fracture or focal pathologic process. Paraspinal and other soft tissues: Evaluated on dedicated CT chest. Disc levels: Intervertebral disc spaces are maintained. Spinal canal is patent. CT LUMBAR SPINE FINDINGS Segmentation: 5 lumbar type vertebral bodies. Alignment: Normal lumbar lordosis. Vertebrae: Mild superior endplate compression fracture deformities at L1-3, with approximately 10% loss of height. No retropulsion. Paraspinal and other soft tissues: Evaluated on dedicated CT abdomen/pelvis. Disc levels: Intervertebral disc spaces are maintained. Spinal canal is patent. IMPRESSION: Mild superior endplate compression fracture deformities at L1-3, with approximately 10% loss of height. No retropulsion. Normal thoracic spine CT. Electronically Signed   By: Charline Bills M.D.   On: 03/11/2023 19:36   CT CHEST ABDOMEN PELVIS W CONTRAST  Result Date: 03/11/2023 CLINICAL DATA:  Level 2 trauma, jumped from 10 foot bridge EXAM: CT CHEST, ABDOMEN, AND PELVIS WITH CONTRAST TECHNIQUE: Multidetector CT imaging of the chest, abdomen and pelvis was performed following the standard protocol during bolus administration of intravenous contrast. RADIATION DOSE REDUCTION: This exam was performed according to the departmental dose-optimization program which includes automated exposure control, adjustment of the mA and/or kV according to patient size and/or use of iterative reconstruction technique. CONTRAST:  75mL OMNIPAQUE IOHEXOL 350 MG/ML SOLN COMPARISON:  CT abdomen/pelvis dated 04/02/2015 FINDINGS: CT CHEST FINDINGS Cardiovascular: Heart is normal in size.  No pericardial effusion. No evidence of thoracic aortic aneurysm. Mediastinum/Nodes: No suspicious mediastinal lymphadenopathy. Visualized thyroid is unremarkable.  Lungs/Pleura: Lungs are clear. No focal consolidation or aspiration. No suspicious pulmonary nodules. No pleural effusion or pneumothorax. Musculoskeletal: Visualized osseous structures are within normal limits. No fracture is seen. Dedicated thoracic spine evaluation has been performed and will be reported separately. CT ABDOMEN PELVIS FINDINGS Hepatobiliary: Liver is within normal limits. No perihepatic fluid/hemorrhage. Gallbladder is unremarkable. No intrahepatic or extrahepatic duct dilatation. Pancreas: Within normal limits. Spleen: Within normal limits.  No perisplenic fluid/hemorrhage. Adrenals/Urinary Tract: Adrenal glands are within normal limits. Kidneys are within normal limits.  No hydronephrosis. Bladder is within normal limits. Stomach/Bowel: Stomach is within normal limits. No evidence of bowel obstruction. Normal appendix (series 3/image 95). No colonic wall thickening or inflammatory changes. Vascular/Lymphatic: No evidence of abdominal aortic aneurysm. No suspicious abdominopelvic lymphadenopathy. Reproductive: Prostate is unremarkable. Other: No abdominopelvic ascites. No hemoperitoneum or free air. Musculoskeletal: Visualized osseous structures are within normal limits. No fracture is seen. Dedicated lumbar spine evaluation has been performed and pertinent upper lumbar findings will be reported separately. IMPRESSION: No traumatic injury to the chest, abdomen, or pelvis. Dedicated thoracolumbar spine evaluation has been performed. Refer to that report for pertinent upper lumbar findings. Electronically Signed   By: Charline Bills M.D.   On: 03/11/2023 19:31   CT HEAD WO CONTRAST  Result Date: 03/11/2023 CLINICAL DATA:  Level 2 trauma, jumped from a 10 foot bridge EXAM: CT HEAD WITHOUT CONTRAST CT CERVICAL SPINE WITHOUT CONTRAST TECHNIQUE: Multidetector CT imaging of the head and cervical spine was performed following the standard protocol without intravenous contrast. Multiplanar CT image  reconstructions of the cervical spine were also generated. RADIATION DOSE REDUCTION: This exam was performed according to the departmental dose-optimization program which includes automated exposure control, adjustment of the mA and/or kV according to patient size and/or use of iterative reconstruction technique. COMPARISON:  None Available. FINDINGS: CT HEAD FINDINGS Brain: No evidence of acute infarction, hemorrhage, hydrocephalus, extra-axial collection or mass lesion/mass effect. Vascular: No hyperdense vessel or unexpected calcification. Skull: Normal. Negative for fracture or focal lesion. Sinuses/Orbits: Partial opacification of the right maxillary sinus. Visualized paranasal sinuses and mastoid air cells are otherwise clear. Other: None. CT CERVICAL SPINE FINDINGS Alignment: Normal cervical lordosis. Skull base and vertebrae: No acute fracture. No primary bone lesion or focal pathologic process. Soft tissues and spinal canal: No prevertebral fluid or swelling. No visible canal hematoma. Disc levels: Intervertebral disc spaces are maintained. Spinal canal is patent. Upper chest: Evaluated on dedicated CT chest. Other: None. IMPRESSION: Normal head CT. Normal cervical spine CT. Electronically Signed   By: Charline Bills M.D.   On: 03/11/2023 19:27   CT CERVICAL SPINE WO CONTRAST  Result Date: 03/11/2023 CLINICAL DATA:  Level 2 trauma, jumped from a 10 foot bridge EXAM: CT HEAD WITHOUT CONTRAST CT CERVICAL SPINE WITHOUT CONTRAST TECHNIQUE: Multidetector CT imaging of the head and cervical spine was performed following the standard protocol without intravenous contrast. Multiplanar CT image reconstructions of the cervical spine were also generated. RADIATION DOSE REDUCTION: This exam was performed according to the departmental dose-optimization program which includes automated exposure control, adjustment of the mA and/or kV according to patient size and/or use of iterative reconstruction technique.  COMPARISON:  None Available. FINDINGS: CT HEAD FINDINGS Brain: No evidence of acute infarction, hemorrhage, hydrocephalus, extra-axial collection or mass lesion/mass effect. Vascular: No hyperdense vessel or unexpected calcification. Skull: Normal. Negative for fracture or focal lesion. Sinuses/Orbits: Partial opacification of the right maxillary sinus. Visualized paranasal sinuses and mastoid air cells are otherwise clear. Other: None. CT CERVICAL SPINE FINDINGS Alignment: Normal cervical lordosis. Skull base and vertebrae: No acute fracture. No primary bone lesion or focal pathologic process. Soft tissues and spinal canal: No prevertebral fluid or swelling. No visible canal hematoma. Disc levels: Intervertebral disc spaces are maintained. Spinal canal is patent. Upper chest: Evaluated on dedicated CT chest. Other: None. IMPRESSION: Normal head CT. Normal cervical spine CT. Electronically Signed   By: Charline Bills M.D.   On: 03/11/2023 19:27   DG Pelvis Portable  Result Date: 03/11/2023 CLINICAL DATA:  Trauma, fell off bridge EXAM: PORTABLE PELVIS 1-2 VIEWS COMPARISON:  None Available. FINDINGS: There is no evidence of pelvic fracture or diastasis. No pelvic bone lesions are seen. IMPRESSION: Negative. Electronically Signed   By: Minerva Fester M.D.   On: 03/11/2023 19:13   DG Chest Port 1 View  Result Date: 03/11/2023 CLINICAL DATA:  Trauma, fell off bridge EXAM: PORTABLE CHEST 1 VIEW COMPARISON:  None Available. FINDINGS: Low lung volumes. Normal cardiomediastinal silhouette. No focal consolidation, pleural effusion, or pneumothorax. No displaced rib fractures. IMPRESSION: No acute cardiopulmonary disease. Electronically Signed   By: Minerva Fester M.D.   On: 03/11/2023 19:12    Procedures Procedures    Medications Ordered in ED Medications  lidocaine (LIDODERM) 5 % 1 patch (1 patch Transdermal Patch Applied 03/11/23 2032)  oxyCODONE-acetaminophen (PERCOCET/ROXICET) 5-325 MG per tablet 1  tablet (has no administration in time range)  ibuprofen (ADVIL) tablet 600 mg (600 mg Oral Given 03/11/23 2031)  QUEtiapine (SEROQUEL XR) 24 hr tablet 150 mg (has no administration in time range)  sertraline (ZOLOFT) tablet 50 mg (has no administration in time range)  methocarbamol (ROBAXIN) tablet 500 mg (has no administration in time range)  ondansetron (ZOFRAN-ODT) disintegrating tablet 4 mg (has no administration in time range)  HYDROmorphone (DILAUDID) injection 1 mg (1 mg Intravenous Given 03/11/23 1844)  lactated ringers bolus 1,000 mL (1,000 mLs Intravenous New Bag/Given 03/11/23 1848)  iohexol (OMNIPAQUE) 350 MG/ML injection 75 mL (75 mLs Intravenous Contrast Given 03/11/23 1908)  ketorolac (TORADOL) 15 MG/ML injection 15 mg (15 mg Intravenous Given 03/11/23 2027)  methocarbamol (ROBAXIN) tablet 1,000 mg (1,000 mg Oral Given 03/11/23 2031)  ondansetron (ZOFRAN) injection 4 mg (4 mg Intravenous Given 03/11/23 2027)    ED Course/ Medical Decision Making/ A&P                                 Medical Decision Making Amount and/or Complexity of Data Reviewed Labs: ordered. Radiology: ordered. ECG/medicine tests: ordered.  Risk Prescription drug management.   This patient presents to the ED for concern of fall, this involves an extensive number of treatment options, and is a complaint that carries with it a high risk of complications and morbidity.  The differential diagnosis includes acute injuries   Co morbidities that complicate the patient evaluation  Depression   Additional history obtained:  Additional history obtained from EMS, law enforcement External records from outside source obtained and reviewed including EMR   Lab Tests:  I Ordered, and personally interpreted labs.  The pertinent results include: Creatinine increased from baseline.  Mild hypokalemia is present.  Leukocytosis and lactic acidosis are present consistent with traumatic injury.   Imaging Studies  ordered:  I ordered imaging studies including x-ray of chest, pelvis, left elbow; CT of head, cervical spine, chest, abdomen, pelvis, T-spine, L-spine I independently visualized and interpreted imaging which showed compression fractures on L1-3 without other acute findings I agree with the radiologist interpretation   Cardiac Monitoring: / EKG:  The patient was maintained on a cardiac monitor.  I personally viewed and interpreted the cardiac monitored which showed an underlying rhythm of: Sinus rhythm   Consultations Obtained:  I requested consultation with the neurosurgery,  and discussed lab and imaging findings as well as pertinent plan - they recommend: LSO brace and outpatient follow-up I requested consultation with the orthopedic surgeon, Dr. Hulda Humphrey,  and discussed lab and imaging findings as well as  pertinent plan - they recommend: Splint and outpatient follow-up  Problem List / ED Course / Critical interventions / Medication management  Patient presents as a level 2 trauma after a fall from an estimated height of 15 feet.  This fall occurred due to intentional jumping off of a bridge onto a street.  This was an attempt of self-harm/suicide.  On arrival, patient is alert and oriented.  Vital signs are notable for mild tachypnea.  I suspect this is secondary to the severe pain that he endorses in his lower back.  Fortunately, he has no neurologic deficits in his lower extremities.  I do not appreciate any areas of deformity on exam.  He has a skin tear and some tenderness to his left elbow.  Per chart review, Tdap was updated in 2022.  Will obtain x-ray.  Chest and abdomen are without tenderness.  Dilaudid was ordered for analgesia.  Workup was initiated, to include imaging studies to assess for acute injuries.  Patient will require psychiatric evaluation after medical clearance.  IVC paperwork was completed.  CT imaging showed consecutive compression fractures and L1-3.  I discussed this with  PA Constantino with neurosurgery team.  She did review the imaging and recommends brace and outpatient follow-up.  Brace was ordered.  Multimodal pain control was ordered.  Lab work is notable for creatinine increased from baseline.  IV fluids were given in the ED.  He has a leukocytosis and lactic acidosis consistent with traumatic injury.  There is a mild hypokalemia.  Elbow x-ray, shows a comminuted intra-articular olecranon fracture.  His overlying skin abrasions do seem superficial.  I discussed this with orthopedic surgeon on-call, Dr. Hulda Humphrey, who reviewed imaging and picture of elbow.  He agrees that skin abrasion is benign.  He recommends splint and outpatient follow-up.  Splint and sling were placed.  Nursing informed to provide sitter.  Patient is medically cleared.  He will remain in the ED pending TTS evaluation. I ordered medication including IV fluids for hydration; Dilaudid, lidocaine patch, Robaxin, Toradol for analgesia Reevaluation of the patient after these medicines showed that the patient improved I have reviewed the patients home medicines and have made adjustments as needed   Social Determinants of Health:  Has PCP        Final Clinical Impression(s) / ED Diagnoses Final diagnoses:  Suicide attempt (HCC)  Fall, initial encounter  Closed fracture of olecranon process of left ulna, initial encounter  Closed compression fracture of L1 lumbar vertebra, initial encounter (HCC)  Closed compression fracture of L2 lumbar vertebra, initial encounter (HCC)  Closed compression fracture of L3 lumbar vertebra, initial encounter [facility])    Rx / DC Orders ED Discharge Orders     None         Gloris Manchester, MD 03/11/23 2039    Gloris Manchester, MD 03/11/23 2150

## 2023-03-11 NOTE — ED Notes (Signed)
Trauma Response Nurse Documentation  Dominic Bryant is a 27 y.o. male arriving to [facility] ED via EMS  Trauma was activated as Level 2 based on the following trauma criteria Falls > 20 ft. with adults, >10 ft. with children (<15).  Patient cleared for CT by Dr. Durwin Nora. Pt transported to CT with trauma response nurse present to monitor. RN remained with the patient throughout their absence from the department for clinical observation. GCS 15.  History   Past Medical History:  Diagnosis Date   Depression    Heart murmur    Thyroid disease      Past Surgical History:  Procedure Laterality Date   ESOPHAGOGASTRODUODENOSCOPY (EGD) WITH PROPOFOL N/A 05/12/2015   Procedure: ESOPHAGOGASTRODUODENOSCOPY (EGD) WITH PROPOFOL;  Surgeon: Willis Modena, MD;  Location: [facility] ENDOSCOPY;  Service: Endoscopy;  Laterality: N/A;   EUS N/A 05/12/2015   Procedure: UPPER ENDOSCOPIC ULTRASOUND (EUS) RADIAL;  Surgeon: Willis Modena, MD;  Location: [facility] ENDOSCOPY;  Service: Endoscopy;  Laterality: N/A;     Initial Focused Assessment (If applicable, or please see trauma documentation): See documentation  CT's Completed:   CT Head, CT C-Spine, CT Chest w/ contrast, and CT abdomen/pelvis w/ contrast   Interventions:  IV, labs CT Head/Cspine/C/A/P XRs Suicide precautions IVC  Plan for disposition:  Other - see chart  Event Summary: Patient to ED after jumping from an [place] on [address], intentional suicide attempt. Patient with back pain and LE pain. Imaging revealed L1-3 compression fxs.NSG was called and recommended LSO brace. Elbox fx also found and orthopedics recommends outpatient f/u, splint/sling placement. Patient is IVCed and will require inpatient psychiatric treatment. [employer] at bedside.   Bedside handoff with ED RN Dominic Bryant.    Dominic Bryant  Trauma Response RN  Please call TRN at [phone] for further assistance.

## 2023-03-11 NOTE — Progress Notes (Signed)
Orthopedic Tech Progress Note Patient Details:  DALLON TURNBOW 01-Mar-1996 [id number]  Ortho Devices Type of Ortho Device: Arm sling, Sugartong splint, Thoracolumbar corset (TLSO) Ortho Device/Splint Location: lue Ortho Device/Splint Interventions: Ordered, Application, Adjustment  I spoke with the dr before seeing patient about concerns of splint, arm sling and brace due to SI. The dr said to apply the brace and splint but leave the sling in the room for later. Post Interventions Patient Tolerated: Well Instructions Provided: Care of device, Adjustment of device  Trinna Post 03/11/2023, 10:54 PM

## 2023-03-11 NOTE — Progress Notes (Signed)
   03/11/23 1910  Spiritual Encounters  Type of Visit Attempt (pt unavailable)  Care provided to: Pt not available   Chaplain responded to level 2 trauma. [employer] involved. Patient was unavailable.   Arlyce Dice, Chaplain Resident [phone]

## 2023-03-12 ENCOUNTER — Encounter (HOSPITAL_COMMUNITY): Payer: Self-pay

## 2023-03-12 DIAGNOSIS — F332 Major depressive disorder, recurrent severe without psychotic features: Secondary | ICD-10-CM

## 2023-03-12 DIAGNOSIS — T1491XA Suicide attempt, initial encounter: Secondary | ICD-10-CM | POA: Diagnosis not present

## 2023-03-12 LAB — BASIC METABOLIC PANEL
Anion gap: 6 (ref 5–15)
BUN: 7 mg/dL (ref 6–20)
CO2: 23 mmol/L (ref 22–32)
Calcium: 8.1 mg/dL — ABNORMAL LOW (ref 8.9–10.3)
Chloride: 106 mmol/L (ref 98–111)
Creatinine, Ser: 0.91 mg/dL (ref 0.61–1.24)
GFR, Estimated: >60 mL/min (ref >60)
Glucose, Bld: 112 mg/dL — ABNORMAL HIGH (ref 70–99)
Potassium: 3.4 mmol/L — ABNORMAL LOW (ref 3.5–5.1)
Sodium: 135 mmol/L (ref 135–145)

## 2023-03-12 LAB — RAPID URINE DRUG SCREEN, HOSP PERFORMED
Amphetamines: NOT DETECTED
Barbiturates: NOT DETECTED
Benzodiazepines: NOT DETECTED
Cocaine: NOT DETECTED
Opiates: POSITIVE — AB
Tetrahydrocannabinol: POSITIVE — AB

## 2023-03-12 LAB — URINALYSIS, ROUTINE W REFLEX MICROSCOPIC
Glucose, UA: NEGATIVE mg/dL
Ketones, ur: 40 mg/dL — AB
Leukocytes,Ua: NEGATIVE
Nitrite: NEGATIVE
Protein, ur: NEGATIVE mg/dL
Specific Gravity, Urine: 1.02 (ref 1.005–1.030)
pH: 5.5 (ref 5.0–8.0)

## 2023-03-12 LAB — URINALYSIS, MICROSCOPIC (REFLEX)

## 2023-03-12 LAB — I-STAT CG4 LACTIC ACID, ED: Lactic Acid, Venous: 1 mmol/L (ref 0.5–1.9)

## 2023-03-12 MED ORDER — HYDROMORPHONE HCL 1 MG/ML IJ SOLN
1.0000 mg | Freq: Once | INTRAMUSCULAR | Status: AC
  Administered 2023-03-12: 1 mg via INTRAVENOUS
  Filled 2023-03-12: qty 1

## 2023-03-12 MED ORDER — SERTRALINE HCL 100 MG PO TABS: 100.0000 mg | ORAL_TABLET | Freq: Every day | ORAL | Status: DC

## 2023-03-12 MED ORDER — SERTRALINE HCL 50 MG PO TABS
150.0000 mg | ORAL_TABLET | Freq: Every day | ORAL | Status: DC
  Administered 2023-03-13 – 2023-03-17 (×5): 150 mg via ORAL
  Filled 2023-03-12 (×5): qty 1

## 2023-03-12 MED ORDER — ONDANSETRON HCL 4 MG/2ML IJ SOLN
4.0000 mg | Freq: Once | INTRAMUSCULAR | Status: AC
  Administered 2023-03-12: 4 mg via INTRAVENOUS
  Filled 2023-03-12: qty 2

## 2023-03-12 MED ORDER — POTASSIUM CHLORIDE CRYS ER 20 MEQ PO TBCR
40.0000 meq | EXTENDED_RELEASE_TABLET | Freq: Once | ORAL | Status: AC
  Administered 2023-03-12: 40 meq via ORAL
  Filled 2023-03-12: qty 2

## 2023-03-12 MED ORDER — ARIPIPRAZOLE 10 MG PO TABS
5.0000 mg | ORAL_TABLET | Freq: Every day | ORAL | Status: DC
Start: 2023-03-13 — End: 2023-03-12

## 2023-03-12 NOTE — Consult Note (Signed)
BH ED ASSESSMENT   Reason for Consult:  SA Referring Physician:  Karene Fry Patient Identification: Dominic Bryant MRN:  [id number] ED Chief Complaint: MDD (major depressive disorder), recurrent episode, severe (HCC)  Diagnosis:  Principal Problem:   MDD (major depressive disorder), recurrent episode, severe (HCC) Active Problems:   Suicide attempt [facility])   ED Assessment Time Calculation: Start Time: 1100 Stop Time: 1140 Total Time in Minutes (Assessment Completion): 40   HPI:   Dominic Bryant is a 27 y.o. male patient who presents involuntary and unaccompanied to [facility]. Clinician asked the pt, "what brought you to the hospital?" Pt reports, he jumped [place] a bridge ([place] [address] [place]) to kill himself after an argument with his sister. Pt reports, he was already irritated before the argument but that made things worse. Pt reports, he does not remember what the argument was about. Pt reports, six months ago he cut himself with a razor blade. Pt denies, HI, AVH, current self-injurious behaviors and access to weapons.    Pt was IVC'd by EDP. Per IVC paperwork: "This patient presented to [facility] emergency department wit EMS and law enforcement following suicidal attempt by jumping off a bridge into traffic. He remains a threat of harm to himself."    Subjective:   Patient seen this morning at [facility], ED for face-to-face psychiatric evaluation.  Patient did sustain minor injuries from his fall, left arm is in a arm sling and patient is reporting back pain and currently wearing a back brace.  Today patient is tearful with depressed mood and affect, states he is embarrassed about his suicide attempt and hates that his family had to see him that way.  He continues to endorse passive suicidal ideations, denies any plan or intent.  Denies homicidal ideations.  Denies any auditory or visual hallucinations.  Patient does not endorse occasional THC use.  Patient states he is  not good about taking his medications.  He does feel like when he is compliant with his medications daily he does feel like they work well for him, however he has been missing doses multiple times a week for the past couple of weeks.  Patient stated he has also had unsteady mood the past few weeks, feels like he gets frustrated or upset very easily.  Patient is wanting to seek treatment, he is agreeable with plan for inpatient psychiatric treatment.  Patient is currently under IVC and does meet criteria to remain under IVC.  Patient stated he was taking 150 mg of Seroquel and 150 mg of Zoloft.  Will continue these medications.  Will continue to recommend for inpatient psychiatric treatment.  Past Psychiatric History:  MDD  Risk to Self or Others: Is the patient at risk to self? Yes Has the patient been a risk to self in the past 6 months? Yes Has the patient been a risk to self within the distant past? Yes Is the patient a risk to others? No Has the patient been a risk to others in the past 6 months? No Has the patient been a risk to others within the distant past? No  [place] Scale:  Flowsheet Row ED from [facility]/11/2023 in [facility] Emergency Department at [facility] ED from 10/05/2022 in [facility] ED from 08/18/2022 in [facility]gent Care at [facility] RISK CATEGORY High Risk No Risk No Risk       Substance Abuse:  Alcohol / Drug  Use Pain Medications: See MAR Prescriptions: See MAR Over the Counter: See MAR History of alcohol / drug use?: No history of alcohol / drug abuse Longest period of sobriety (when/how long): None. Negative Consequences of Use:  (None.) Withdrawal Symptoms: None  Past Medical History:  Past Medical History:  Diagnosis Date   Depression    Heart murmur    Thyroid disease     Past Surgical History:  Procedure Laterality Date   ESOPHAGOGASTRODUODENOSCOPY (EGD) WITH PROPOFOL N/A 05/12/2015    Procedure: ESOPHAGOGASTRODUODENOSCOPY (EGD) WITH PROPOFOL;  Surgeon: Willis Modena, MD;  Location: [facility] ENDOSCOPY;  Service: Endoscopy;  Laterality: N/A;   EUS N/A 05/12/2015   Procedure: UPPER ENDOSCOPIC ULTRASOUND (EUS) RADIAL;  Surgeon: Willis Modena, MD;  Location: [facility] ENDOSCOPY;  Service: Endoscopy;  Laterality: N/A;   Family History:  Family History  Problem Relation Age of Onset   Crohn's disease Mother    Asthma Father    Alcohol abuse Father    Social History:  Social History   Substance and Sexual Activity  Alcohol Use Yes   Comment: social     Social History   Substance and Sexual Activity  Drug Use Yes   Types: Marijuana    Social History   Socioeconomic History   Marital status: Single    Spouse name: Not on file   Number of children: Not on file   Years of education: Not on file   Highest education level: High school graduate  Occupational History   Not on file  Tobacco Use   Smoking status: Every Day    Current packs/day: 1.00    Types: Cigarettes   Smokeless tobacco: Never  Vaping Use   Vaping status: Never Used  Substance and Sexual Activity   Alcohol use: Yes    Comment: social   Drug use: Yes    Types: Marijuana   Sexual activity: Yes    Partners: Female    Birth control/protection: Condom  Other Topics Concern   Not on file  Social History Narrative   Not on file   Social Determinants of Health   Financial Resource Strain: Not on file  Food Insecurity: Not on file  Transportation Needs: Not on file  Physical Activity: Not on file  Stress: Not on file  Social Connections: Not on file   Additional Social History:    Allergies:  No Known Allergies  Labs:  Results for orders placed or performed during the hospital encounter of 03/11/23 (from the past 48 hour(s))  Comprehensive metabolic panel     Status: Abnormal   Collection Time: 03/11/23  6:50 PM  Result Value Ref Range   Sodium 142 135 - 145 mmol/L   Potassium 3.2 (L) 3.5 -  5.1 mmol/L   Chloride 105 98 - 111 mmol/L   CO2 20 (L) 22 - 32 mmol/L   Glucose, Bld 130 (H) 70 - 99 mg/dL    Comment: Glucose reference range applies only to samples taken after fasting for at least 8 hours.   BUN 9 6 - 20 mg/dL   Creatinine, Ser 0.98 (H) 0.61 - 1.24 mg/dL   Calcium 9.3 8.9 - 11.9 mg/dL   Total Protein 5.9 (L) 6.5 - 8.1 g/dL   Albumin 3.3 (L) 3.5 - 5.0 g/dL   AST 48 (H) 15 - 41 U/L   ALT 25 0 - 44 U/L   Alkaline Phosphatase 43 38 - 126 U/L   Total Bilirubin 0.6 0.3 - 1.2 mg/dL   GFR,  Estimated >60 >60 mL/min    Comment: (NOTE) Calculated using the CKD-EPI Creatinine Equation (2021)    Anion gap 17 (H) 5 - 15    Comment: Performed at [facility], [address] [place], [place] [address]  CBC     Status: Abnormal   Collection Time: 03/11/23  6:50 PM  Result Value Ref Range   WBC 12.9 (H) 4.0 - 10.5 K/uL   RBC 5.44 4.22 - 5.81 MIL/uL   Hemoglobin 15.9 13.0 - 17.0 g/dL   HCT 57.8 46.9 - 62.9 %   MCV 91.5 80.0 - 100.0 fL   MCH 29.2 26.0 - 34.0 pg   MCHC 31.9 30.0 - 36.0 g/dL   RDW 52.8 41.3 - 24.4 %   Platelets 308 150 - 400 K/uL   nRBC 0.0 0.0 - 0.2 %    Comment: Performed at [facility], [address], [place], [place] [address]  Magnesium     Status: None   Collection Time: 03/11/23  6:50 PM  Result Value Ref Range   Magnesium 1.7 1.7 - 2.4 mg/dL    Comment: Performed at [facility], [address] [place], [place] [address]  CK     Status: None   Collection Time: 03/11/23  6:50 PM  Result Value Ref Range   Total CK 329 49 - 397 U/L    Comment: Performed at [facility], [address] [place], [place] [address]  Ethanol     Status: None   Collection Time: 03/11/23  6:51 PM  Result Value Ref Range   Alcohol, Ethyl (B) <10 <10 mg/dL    Comment: (NOTE) Lowest detectable limit for serum alcohol is 10 mg/dL.  For medical purposes only. Performed at [facility] Lab, [address] [place], [place] [address]    Salicylate level     Status: Abnormal   Collection Time: 03/11/23  6:51 PM  Result Value Ref Range   Salicylate Lvl <7.0 (L) 7.0 - 30.0 mg/dL    Comment: Performed at [facility], [address] [place], [place] [address]  Acetaminophen level     Status: Abnormal   Collection Time: 03/11/23  6:51 PM  Result Value Ref Range   Acetaminophen (Tylenol), Serum <10 (L) 10 - 30 ug/mL    Comment: (NOTE) Therapeutic concentrations vary significantly. A range of 10-30 ug/mL  may be an effective concentration for many patients. However, some  are best treated at concentrations outside of this range. Acetaminophen concentrations >150 ug/mL at 4 hours after ingestion  and >50 ug/mL at 12 hours after ingestion are often associated with  toxic reactions.  Performed at [facility], [address] [place], [place] [address]   I-Stat Chem 8, ED     Status: Abnormal   Collection Time: 03/11/23  6:52 PM  Result Value Ref Range   Sodium 142 135 - 145 mmol/L   Potassium 3.2 (L) 3.5 - 5.1 mmol/L   Chloride 108 98 - 111 mmol/L   BUN 9 6 - 20 mg/dL   Creatinine, Ser 2.95 (H) 0.61 - 1.24 mg/dL   Glucose, Bld 188 (H) 70 - 99 mg/dL    Comment: Glucose reference range applies only to samples taken after fasting for at least 8 hours.   Calcium, Ion 1.15 1.15 - 1.40 mmol/L   TCO2 21 (L) 22 - 32 mmol/L   Hemoglobin 17.3 (H) 13.0 - 17.0 g/dL  HCT 51.0 39.0 - 52.0 %  I-Stat Lactic Acid, ED     Status: Abnormal   Collection Time: 03/11/23  6:52 PM  Result Value Ref Range   Lactic Acid, Venous 3.7 (HH) 0.5 - 1.9 mmol/L   Comment NOTIFIED PHYSICIAN   Urinalysis, Routine w reflex microscopic -Urine, Clean Catch     Status: Abnormal   Collection Time: 03/12/23  2:18 AM  Result Value Ref Range   Color, Urine YELLOW YELLOW   APPearance HAZY (A) CLEAR   Specific Gravity, Urine 1.020 1.005 - 1.030   pH 5.5 5.0 - 8.0   Glucose, UA NEGATIVE NEGATIVE mg/dL   Hgb urine dipstick TRACE (A) NEGATIVE    Bilirubin Urine SMALL (A) NEGATIVE   Ketones, ur 40 (A) NEGATIVE mg/dL   Protein, ur NEGATIVE NEGATIVE mg/dL   Nitrite NEGATIVE NEGATIVE   Leukocytes,Ua NEGATIVE NEGATIVE    Comment: Performed at [facility], [address], [place], [place] [address]  Urine rapid drug screen (hosp performed)     Status: Abnormal   Collection Time: 03/12/23  2:18 AM  Result Value Ref Range   Opiates POSITIVE (A) NONE DETECTED   Cocaine NONE DETECTED NONE DETECTED   Benzodiazepines NONE DETECTED NONE DETECTED   Amphetamines NONE DETECTED NONE DETECTED   Tetrahydrocannabinol POSITIVE (A) NONE DETECTED   Barbiturates NONE DETECTED NONE DETECTED    Comment: (NOTE) DRUG SCREEN FOR MEDICAL PURPOSES ONLY.  IF CONFIRMATION IS NEEDED FOR ANY PURPOSE, NOTIFY LAB WITHIN 5 DAYS.  LOWEST DETECTABLE LIMITS FOR URINE DRUG SCREEN Drug Class                     Cutoff (ng/mL) Amphetamine and metabolites    1000 Barbiturate and metabolites    200 Benzodiazepine                 200 Opiates and metabolites        300 Cocaine and metabolites        300 THC                            50 Performed at [facility], [address] [place], [place] [address]   Urinalysis, Microscopic (reflex)     Status: Abnormal   Collection Time: 03/12/23  2:18 AM  Result Value Ref Range   RBC / HPF 6-10 0 - 5 RBC/hpf   WBC, UA 0-5 0 - 5 WBC/hpf   Bacteria, UA RARE (A) NONE SEEN   Squamous Epithelial / HPF 0-5 0 - 5 /HPF   Mucus PRESENT     Comment: Performed at [facility], [address], [place], [place] [address]    Current Facility-Administered Medications  Medication Dose Route Frequency Provider Last Rate Last Admin   ibuprofen (ADVIL) tablet 600 mg  600 mg Oral Q6H Gloris Manchester, MD   600 mg at 03/12/23 0905   lidocaine (LIDODERM) 5 % 1 patch  1 patch Transdermal Q24H Gloris Manchester, MD   1 patch at 03/11/23 2032   methocarbamol (ROBAXIN) tablet 500 mg  500 mg Oral QID Gloris Manchester, MD   500 mg at  03/12/23 0905   ondansetron (ZOFRAN-ODT) disintegrating tablet 4 mg  4 mg Oral Q8H PRN Gloris Manchester, MD   4 mg at 03/12/23 0217   oxyCODONE-acetaminophen (PERCOCET/ROXICET) 5-325 MG per tablet 1 tablet  1 tablet Oral Q3H PRN Gloris Manchester, MD  QUEtiapine (SEROQUEL XR) 24 hr tablet 150 mg  150 mg Oral QHS Gloris Manchester, MD   150 mg at 03/12/23 0865   sertraline (ZOLOFT) tablet 50 mg  50 mg Oral Daily Gloris Manchester, MD   50 mg at 03/12/23 7846   Current Outpatient Medications  Medication Sig Dispense Refill   QUEtiapine Fumarate (SEROQUEL XR) 150 MG 24 hr tablet Take 1 tablet (150 mg total) by mouth at bedtime. 90 tablet 1   sertraline (ZOLOFT) 100 MG tablet Take 150 mg by mouth daily.      Psychiatric Specialty Exam: Presentation  General Appearance:  Appropriate for Environment  Eye Contact: Fair  Speech: Clear and Coherent  Speech Volume: Normal  Handedness:No data recorded  Mood and Affect  Mood: Anxious; Depressed  Affect: Tearful   Thought Process  Thought Processes: Coherent  Descriptions of Associations:Intact  Orientation:Full (Time, Place and Person)  Thought Content:Logical; WDL  History of Schizophrenia/Schizoaffective disorder:No  Duration of Psychotic Symptoms:N/A  Hallucinations:Hallucinations: None  Ideas of Reference:None  Suicidal Thoughts:Suicidal Thoughts: Yes, Passive  Homicidal Thoughts:Homicidal Thoughts: No   Sensorium  Memory: Recent Good; Immediate Good  Judgment: Fair  Insight: Fair   Art therapist  Concentration: Good  Attention Span: Good  Recall: Good  Fund of Knowledge: Good  Language: Good   Psychomotor Activity  Psychomotor Activity: Psychomotor Activity: Increased   Assets  Assets: Desire for Improvement; Physical Health; Social Support    Sleep  Sleep: Sleep: Good   Physical Exam: Physical Exam Neurological:     Mental Status: He is alert and oriented to person, place, and  time.  Psychiatric:        Attention and Perception: Attention normal.        Mood and Affect: Mood is anxious and depressed.        Speech: Speech normal.        Behavior: Behavior is cooperative.        Thought Content: Thought content includes suicidal ideation.    Review of Systems  Psychiatric/Behavioral:  Positive for depression and suicidal ideas.    Blood pressure 109/81, pulse 89, temperature 98.6 F (37 C), temperature source Oral, resp. rate 18, height 5\' 7"  (1.702 m), weight 99.8 kg, SpO2 98%. Body mass index is 34.46 kg/m.  Medical Decision Making: Pt case reviewed and discussed with Dr. Lucianne Muss. Pt continues to meet IVC criteria. Will recommend inpatient psychiatric treatment.   - current barrier to placement is related to medical needs such as back brace and arm sling - continue home medications  Disposition:  recommend IP treatment  Eligha Bridegroom, NP 03/12/2023 11:51 AM

## 2023-03-12 NOTE — Progress Notes (Signed)
LCSW Progress Note  [id number]   Dominic Bryant  03/12/2023  10:14 AM  Description:   Inpatient Psychiatric Referral  Patient was recommended inpatient per Eligha Bridegroom, NP. There are no available beds at [facility], per [facility] Rona Ravens, RN. Patient was referred to the following out of network facilities:   Destination  Service Provider Address Phone Fax  [facility]  [address], [place] [address] [phone]  [facility] [address]  [address] [place] [address] [phone]  [facility] [address]  [address] [place], [place] [address] [phone]  [facility]  [address] [place] [address] [phone]  [facility]  [address] [place] [address] [phone]  [facility]  [address], [place] [address] [phone]  [facility]  [address], [place] [address] [phone]  [facility]  [address] [place] [address] [phone]  [facility]  [place] [phone]  [facility] EFAX  [address] [place], [place] [phone]  [facility]  [address], [place] [address] [phone]  [facility]  [address], [place] [address] [phone]  [facility]  [address] [place], [place] [address] [phone]  [facility]  [address], [place] [address] [phone]  [facility]  [place] [phone]  [facility]  [address] [place] [address] [phone]     Situation ongoing, CSW to continue following and update chart as more information becomes available.      Cathie Beams, LCSW  03/12/2023 10:14 AM

## 2023-03-12 NOTE — ED Notes (Signed)
Ivc paperwork  in blue zone attached to the clipboard

## 2023-03-12 NOTE — ED Notes (Signed)
Patient with bouts of sudden onset nausea and vomiting. Uncontrolled by the sublingual zofran. Patient also with pain to his back 9/10. All meds noted in [facility] are PO which I fear with just be thrown up again. Will request for IV meds instead.

## 2023-03-12 NOTE — ED Provider Notes (Signed)
Emergency Medicine Observation Re-evaluation Note  Dominic Bryant is a 27 y.o. male, seen on rounds today.  Pt initially presented to the ED for complaints of Trauma and Suicide Attempt Currently, the patient is resting comfortably in bed. Rated his pain at 5/10 prior to pain medication, now down to 4/10.Marland Kitchen  Physical Exam  BP 136/82   Pulse (!) 113   Temp 98.8 F (37.1 C)   Resp (!) 30   Ht 5\' 7"  (1.702 m)   Wt 99.8 kg   SpO2 100%   BMI 34.46 kg/m  Physical Exam General: NAD Cardiac: Well perfused Lungs: even and unlabored, CTAB MSK: Left arm splint in place, TLSO brace in place Psych: No agitation  ED Course / MDM  EKG:   I have reviewed the labs performed to date as well as medications administered while in observation.  Recent changes in the last 24 hours include medically cleared previously and evaluated by psychiatry, recommended inpatient.  Plan  Current plan is for inpatient admission.    Ernie Avena, MD 03/12/23 [phone]
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
## 2023-03-12 NOTE — ED Notes (Signed)
Sitter at bedside at this time

## 2023-03-12 NOTE — ED Notes (Signed)
Pt received for care at 1900.  This RN introduced self to pt.  Pt calm, cooperative, and pleasant with this RN and staff.  Pt denies needs at this time.  Bed in lowest position, wheels locked.  Pt in view of sitter and nurse station with necessary precautions maintained.

## 2023-03-12 NOTE — ED Notes (Signed)
Pt arrived in purple zone on stretcher. Pt transferred self to hospital bed. Pt A&Ox4, sitter at bedside. Belongings located on the back of the stretcher. Will inventory and lock belongings in purple.

## 2023-03-12 NOTE — Progress Notes (Signed)
Orthopedic Tech Progress Note Patient Details:  Dominic Bryant Nov 14, 1995 [id number] Reapplied Long arm splint and gave patient an arm sling. Addressed concerns with RN about arm sling and patient not elevating arm without it.  Ortho Devices Type of Ortho Device: Post (long arm) splint Ortho Device/Splint Location: LUE Ortho Device/Splint Interventions: Ordered, Application   Post Interventions Patient Tolerated: Well Instructions Provided: Poper ambulation with device, Care of device  Adria Costley A Giannie Soliday 03/12/2023, 7:02 PM

## 2023-03-12 NOTE — ED Notes (Signed)
[facility] PAPERWORK IS NOW IN PURPLE ZONE ATTACHED TO THE CLIP BOARD CALLED COURT OF CLERK TO RETRIEVE CASE NO. [id number]

## 2023-03-12 NOTE — ED Notes (Signed)
Belongings charted. Please see patient belongings flowsheet. The following belongings were inventoried from the belongings found on the back of stretcher when pt came to purple zone: pants, sock, shoes, 30 cents, sling.
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
## 2023-03-12 NOTE — Progress Notes (Signed)
LCSW Progress Note  [id number]   ALEXANDE WILLEVER  03/12/2023  6:20 PM    Inpatient Behavioral Health Placement  Pt meets inpatient criteria per [facility] . There are no available beds within [facility] BH system per [facility] AC Danika Riley,RN. Referral was sent to the following facilities;    Destination  Service Provider Address Phone Fax  [facility]  [address] [place] [address] [phone]  [facility] [address]  [address] [place] [address] [phone]  [facility] [address] [place]  [address] [place], [place] [address] [phone]  [facility]  [address] [place] [address] [phone]  [facility]  [address] [place] [address] [phone]  [facility]  [address], [place] [address] [phone]  [facility]  [address], [place] [address] [phone]  [facility]  [address] [place] [address] [phone]  [facility]  [place] [phone]  [facility] EFAX  [address] [place], [place] [phone]  [facility]  [address] [place] [address] [phone]  [facility]  [address], [place] [address] [phone]  [facility]  [address] [place], [place] [address] [phone]  [facility]  [address] [place] [address] [phone]  [facility]  [place] [phone]  [facility]  [address] [place] [address] [phone]    Situation ongoing,  CSW will follow up.    Maryjean Ka, MSW, LCSWA 03/12/2023 6:20 PM

## 2023-03-13 NOTE — ED Notes (Signed)
Breakfast provided.

## 2023-03-13 NOTE — ED Notes (Signed)
IVC current

## 2023-03-13 NOTE — Progress Notes (Signed)
[facility] Psych ED Progress Note  03/13/2023 11:45 AM Dominic Bryant  MRN:  [id number]   Subjective:   Patient seen at [facility], ED for face-to-face psychiatric evaluation.  Patient reports sleeping and eating well.  He does report feeling slightly better today, he is still experiencing feelings of embarrassment and guilt over his suicide attempt.  Patient is still feeling depressed and is requesting treatment.  Patient still appearing tearful at times with depressed mood and affect.  He denies being acutely suicidal, denies HI, denies AVH.  Denies any adverse effects from medications.  He is understanding the current barrier for placement is the medical concerns with his fractures and the requirement of back brace and arm sling.  We will continue to reach out to facilities who can accommodate treatment for him.  Principal Problem: MDD (major depressive disorder), recurrent episode, severe (HCC) Diagnosis:  Principal Problem:   MDD (major depressive disorder), recurrent episode, severe (HCC) Active Problems:   Suicide attempt [facility])   ED Assessment Time Calculation: Start Time: 1100 Stop Time: 1120 Total Time in Minutes (Assessment Completion): 20   [place] Scale:  Flowsheet Row ED from 03/11/2023 in [facility] ED from 10/05/2022 in [facility] ED from 08/18/2022 in [facility] at [facility] RISK CATEGORY High Risk No Risk No Risk       Past Medical History:  Past Medical History:  Diagnosis Date   Depression    Heart murmur    MDD (major depressive disorder)    Suicide attempt (HCC) 03/11/2023   Jumped off a bridge   Thyroid disease     Past Surgical History:  Procedure Laterality Date   ESOPHAGOGASTRODUODENOSCOPY (EGD) WITH PROPOFOL N/A 05/12/2015   Procedure: ESOPHAGOGASTRODUODENOSCOPY (EGD) WITH PROPOFOL;  Surgeon: Willis Modena, MD;  Location: [facility] ENDOSCOPY;  Service:  Endoscopy;  Laterality: N/A;   EUS N/A 05/12/2015   Procedure: UPPER ENDOSCOPIC ULTRASOUND (EUS) RADIAL;  Surgeon: Willis Modena, MD;  Location: [facility] ENDOSCOPY;  Service: Endoscopy;  Laterality: N/A;   Family History:  Family History  Problem Relation Age of Onset   Crohn's disease Mother    Asthma Father    Alcohol abuse Father    Social History:  Social History   Substance and Sexual Activity  Alcohol Use Yes   Comment: social     Social History   Substance and Sexual Activity  Drug Use Yes   Types: Marijuana    Social History   Socioeconomic History   Marital status: Single    Spouse name: Not on file   Number of children: Not on file   Years of education: Not on file   Highest education level: High school graduate  Occupational History   Not on file  Tobacco Use   Smoking status: Every Day    Current packs/day: 1.00    Types: Cigarettes   Smokeless tobacco: Never  Vaping Use   Vaping status: Never Used  Substance and Sexual Activity   Alcohol use: Yes    Comment: social   Drug use: Yes    Types: Marijuana   Sexual activity: Yes    Partners: Female    Birth control/protection: Condom  Other Topics Concern   Not on file  Social History Narrative   Not on file   Social Determinants of Health   Financial Resource Strain: Not on file  Food Insecurity: Not on file  Transportation Needs: Not  on file  Physical Activity: Not on file  Stress: Not on file  Social Connections: Not on file    Sleep: Good  Appetite:  Good  Current Medications: Current Facility-Administered Medications  Medication Dose Route Frequency Provider Last Rate Last Admin   ibuprofen (ADVIL) tablet 600 mg  600 mg Oral Q6H Gloris Manchester, MD   600 mg at 03/13/23 0749   lidocaine (LIDODERM) 5 % 1 patch  1 patch Transdermal Q24H Gloris Manchester, MD   1 patch at 03/12/23 2005   methocarbamol (ROBAXIN) tablet 500 mg  500 mg Oral QID Gloris Manchester, MD   500 mg at 03/13/23 0934   ondansetron  (ZOFRAN-ODT) disintegrating tablet 4 mg  4 mg Oral Q8H PRN Gloris Manchester, MD   4 mg at 03/12/23 0217   oxyCODONE-acetaminophen (PERCOCET/ROXICET) 5-325 MG per tablet 1 tablet  1 tablet Oral Q3H PRN Gloris Manchester, MD   1 tablet at 03/13/23 0750   QUEtiapine (SEROQUEL XR) 24 hr tablet 150 mg  150 mg Oral QHS Gloris Manchester, MD   150 mg at 03/13/23 0005   sertraline (ZOLOFT) tablet 150 mg  150 mg Oral Daily Eligha Bridegroom, NP   150 mg at 03/13/23 1610   Current Outpatient Medications  Medication Sig Dispense Refill   QUEtiapine Fumarate (SEROQUEL XR) 150 MG 24 hr tablet Take 1 tablet (150 mg total) by mouth at bedtime. 90 tablet 1   sertraline (ZOLOFT) 100 MG tablet Take 150 mg by mouth daily.      Lab Results:  Results for orders placed or performed during the hospital encounter of 03/11/23 (from the past 48 hour(s))  Comprehensive metabolic panel     Status: Abnormal   Collection Time: 03/11/23  6:50 PM  Result Value Ref Range   Sodium 142 135 - 145 mmol/L   Potassium 3.2 (L) 3.5 - 5.1 mmol/L   Chloride 105 98 - 111 mmol/L   CO2 20 (L) 22 - 32 mmol/L   Glucose, Bld 130 (H) 70 - 99 mg/dL    Comment: Glucose reference range applies only to samples taken after fasting for at least 8 hours.   BUN 9 6 - 20 mg/dL   Creatinine, Ser 9.60 (H) 0.61 - 1.24 mg/dL   Calcium 9.3 8.9 - 45.4 mg/dL   Total Protein 5.9 (L) 6.5 - 8.1 g/dL   Albumin 3.3 (L) 3.5 - 5.0 g/dL   AST 48 (H) 15 - 41 U/L   ALT 25 0 - 44 U/L   Alkaline Phosphatase 43 38 - 126 U/L   Total Bilirubin 0.6 0.3 - 1.2 mg/dL   GFR, Estimated >09 >81 mL/min    Comment: (NOTE) Calculated using the CKD-EPI Creatinine Equation (2021)    Anion gap 17 (H) 5 - 15    Comment: Performed at [facility] Lab, [address], [place], [place] [address]  CBC     Status: Abnormal   Collection Time: 03/11/23  6:50 PM  Result Value Ref Range   WBC 12.9 (H) 4.0 - 10.5 K/uL   RBC 5.44 4.22 - 5.81 MIL/uL   Hemoglobin 15.9 13.0 - 17.0 g/dL   HCT 82.9  56.2 - 13.0 %   MCV 91.5 80.0 - 100.0 fL   MCH 29.2 26.0 - 34.0 pg   MCHC 31.9 30.0 - 36.0 g/dL   RDW 86.5 78.4 - 69.6 %   Platelets 308 150 - 400 K/uL   nRBC 0.0 0.0 - 0.2 %    Comment:  Performed at [facility], [address], [place], [place] [address]  Magnesium     Status: None   Collection Time: 03/11/23  6:50 PM  Result Value Ref Range   Magnesium 1.7 1.7 - 2.4 mg/dL    Comment: Performed at [facility], [address], [place], [place] [address]  CK     Status: None   Collection Time: 03/11/23  6:50 PM  Result Value Ref Range   Total CK 329 49 - 397 U/L    Comment: Performed at [facility] Lab, [address], [place], [place] [address]  Ethanol     Status: None   Collection Time: 03/11/23  6:51 PM  Result Value Ref Range   Alcohol, Ethyl (B) <10 <10 mg/dL    Comment: (NOTE) Lowest detectable limit for serum alcohol is 10 mg/dL.  For medical purposes only. Performed at [facility] Lab, [address] [place], [place] [address]   Salicylate level     Status: Abnormal   Collection Time: 03/11/23  6:51 PM  Result Value Ref Range   Salicylate Lvl <7.0 (L) 7.0 - 30.0 mg/dL    Comment: Performed at [facility] Lab, [address] [place], [place] [address]  Acetaminophen level     Status: Abnormal   Collection Time: 03/11/23  6:51 PM  Result Value Ref Range   Acetaminophen (Tylenol), Serum <10 (L) 10 - 30 ug/mL    Comment: (NOTE) Therapeutic concentrations vary significantly. A range of 10-30 ug/mL  may be an effective concentration for many patients. However, some  are best treated at concentrations outside of this range. Acetaminophen concentrations >150 ug/mL at 4 hours after ingestion  and >50 ug/mL at 12 hours after ingestion are often associated with  toxic reactions.  Performed at [facility] Lab, [address], [place], [place] [address]   I-Stat Chem 8, ED     Status: Abnormal   Collection Time: 03/11/23  6:52 PM  Result Value  Ref Range   Sodium 142 135 - 145 mmol/L   Potassium 3.2 (L) 3.5 - 5.1 mmol/L   Chloride 108 98 - 111 mmol/L   BUN 9 6 - 20 mg/dL   Creatinine, Ser 4.27 (H) 0.61 - 1.24 mg/dL   Glucose, Bld 062 (H) 70 - 99 mg/dL    Comment: Glucose reference range applies only to samples taken after fasting for at least 8 hours.   Calcium, Ion 1.15 1.15 - 1.40 mmol/L   TCO2 21 (L) 22 - 32 mmol/L   Hemoglobin 17.3 (H) 13.0 - 17.0 g/dL   HCT 37.6 28.3 - 15.1 %  I-Stat Lactic Acid, ED     Status: Abnormal   Collection Time: 03/11/23  6:52 PM  Result Value Ref Range   Lactic Acid, Venous 3.7 (HH) 0.5 - 1.9 mmol/L   Comment NOTIFIED PHYSICIAN   Urinalysis, Routine w reflex microscopic -Urine, Clean Catch     Status: Abnormal   Collection Time: 03/12/23  2:18 AM  Result Value Ref Range   Color, Urine YELLOW YELLOW   APPearance HAZY (A) CLEAR   Specific Gravity, Urine 1.020 1.005 - 1.030   pH 5.5 5.0 - 8.0   Glucose, UA NEGATIVE NEGATIVE mg/dL   Hgb urine dipstick TRACE (A) NEGATIVE   Bilirubin Urine SMALL (A) NEGATIVE   Ketones, ur 40 (A) NEGATIVE mg/dL   Protein, ur NEGATIVE NEGATIVE mg/dL   Nitrite NEGATIVE NEGATIVE   Leukocytes,Ua NEGATIVE  NEGATIVE    Comment: Performed at [facility] Lab, [address] [place], [place] [address]  Urine rapid drug screen (hosp performed)     Status: Abnormal   Collection Time: 03/12/23  2:18 AM  Result Value Ref Range   Opiates POSITIVE (A) NONE DETECTED   Cocaine NONE DETECTED NONE DETECTED   Benzodiazepines NONE DETECTED NONE DETECTED   Amphetamines NONE DETECTED NONE DETECTED   Tetrahydrocannabinol POSITIVE (A) NONE DETECTED   Barbiturates NONE DETECTED NONE DETECTED    Comment: (NOTE) DRUG SCREEN FOR MEDICAL PURPOSES ONLY.  IF CONFIRMATION IS NEEDED FOR ANY PURPOSE, NOTIFY LAB WITHIN 5 DAYS.  LOWEST DETECTABLE LIMITS FOR URINE DRUG SCREEN Drug Class                     Cutoff (ng/mL) Amphetamine and metabolites    1000 Barbiturate and  metabolites    200 Benzodiazepine                 200 Opiates and metabolites        300 Cocaine and metabolites        300 THC                            50 Performed at [facility], [address] [place], [place] [address]   Urinalysis, Microscopic (reflex)     Status: Abnormal   Collection Time: 03/12/23  2:18 AM  Result Value Ref Range   RBC / HPF 6-10 0 - 5 RBC/hpf   WBC, UA 0-5 0 - 5 WBC/hpf   Bacteria, UA RARE (A) NONE SEEN   Squamous Epithelial / HPF 0-5 0 - 5 /HPF   Mucus PRESENT     Comment: Performed at [facility] Lab, [address] [place], [place] [address]  Basic metabolic panel     Status: Abnormal   Collection Time: 03/12/23  1:24 PM  Result Value Ref Range   Sodium 135 135 - 145 mmol/L   Potassium 3.4 (L) 3.5 - 5.1 mmol/L   Chloride 106 98 - 111 mmol/L   CO2 23 22 - 32 mmol/L   Glucose, Bld 112 (H) 70 - 99 mg/dL    Comment: Glucose reference range applies only to samples taken after fasting for at least 8 hours.   BUN 7 6 - 20 mg/dL   Creatinine, Ser 0.10 0.61 - 1.24 mg/dL   Calcium 8.1 (L) 8.9 - 10.3 mg/dL   GFR, Estimated >93 >23 mL/min    Comment: (NOTE) Calculated using the CKD-EPI Creatinine Equation (2021)    Anion gap 6 5 - 15    Comment: Performed at [facility] Lab, [address] [place], [place] [address]  I-Stat CG4 Lactic Acid     Status: None   Collection Time: 03/12/23  1:58 PM  Result Value Ref Range   Lactic Acid, Venous 1.0 0.5 - 1.9 mmol/L    Blood Alcohol level:  Lab Results  Component Value Date   ETH <10 03/11/2023    Psychiatric Specialty Exam:  Presentation  General Appearance:  Appropriate for Environment  Eye Contact: Good  Speech: Clear and Coherent  Speech Volume: Normal  Handedness:No data recorded  Mood and Affect  Mood: Anxious; Depressed  Affect: Congruent   Thought Process  Thought Processes: Coherent  Descriptions of Associations:Intact  Orientation:Full (Time, Place and  Person)  Thought Content:WDL  History of Schizophrenia/Schizoaffective  disorder:No  Duration of Psychotic Symptoms:N/A  Hallucinations:Hallucinations: None  Ideas of Reference:None  Suicidal Thoughts:Suicidal Thoughts: No  Homicidal Thoughts:Homicidal Thoughts: No   Sensorium  Memory: Immediate Fair; Recent Fair  Judgment: Fair  Insight: Fair   Executive Functions  Concentration: Good  Attention Span: Good  Recall: Good  Fund of Knowledge: Good  Language: Good   Psychomotor Activity  Psychomotor Activity: Psychomotor Activity: Normal   Assets  Assets: Desire for Improvement; Physical Health; Resilience; Social Support   Sleep  Sleep: Sleep: Good    Physical Exam: Physical Exam Neurological:     Mental Status: He is alert and oriented to person, place, and time.  Psychiatric:        Attention and Perception: Attention normal.        Mood and Affect: Mood is depressed. Affect is tearful.        Speech: Speech normal.        Behavior: Behavior is cooperative.        Thought Content: Thought content normal.    Review of Systems  Psychiatric/Behavioral:  Positive for depression, substance abuse and suicidal ideas.   All other systems reviewed and are negative.  Blood pressure 120/74, pulse (!) 103, temperature 98.9 F (37.2 C), temperature source Oral, resp. rate 19, height 5\' 7"  (1.702 m), weight 99.8 kg, SpO2 99%. Body mass index is 34.46 kg/m.   Medical Decision Making: Pt case reviewed and discussed with Dr. Lucianne Muss. Pt continues to meet criteria for IVC and inpatient psychiatric treatment at this time. [facility] is unable to accommodate medical concerns, CSW has faxed out to other appropriate facilities.   - no medication changes at this time  Eligha Bridegroom, NP 03/13/2023, 11:45 AM

## 2023-03-13 NOTE — ED Provider Notes (Signed)
Emergency Medicine Observation Re-evaluation Note  Dominic Bryant is a 27 y.o. male, seen on rounds today.  Pt initially presented to the ED for complaints of Trauma and Suicide Attempt Currently, the patient is resting.  Physical Exam  BP 120/74 (BP Location: Right Arm)   Pulse (!) 103   Temp 98.9 F (37.2 C) (Oral)   Resp 19   Ht 5\' 7"  (1.702 m)   Wt 99.8 kg   SpO2 99%   BMI 34.46 kg/m  Physical Exam General: NAD   ED Course / MDM  EKG:EKG Interpretation Date/Time:  Sunday March 11 2023 19:21:48 EDT Ventricular Rate:  96 PR Interval:  140 QRS Duration:  87 QT Interval:  353 QTC Calculation: 447 R Axis:   77  Text Interpretation: Sinus rhythm Normal ECG Confirmed by Geoffery Lyons ([id number]) on 03/12/2023 10:08:02 PM  I have reviewed the labs performed to date as well as medications administered while in observation.  Recent changes in the last 24 hours include no acute events reported.  Plan  Current plan is for placement.    Wynetta Fines, MD 03/13/23 [phone]

## 2023-03-13 NOTE — Progress Notes (Signed)
LCSW Progress Note  [id number]   Dominic Bryant  03/13/2023  9:52 AM  Description:   Inpatient Psychiatric Referral  Patient was recommended inpatient per Eligha Bridegroom, NP. There are no available beds at [facility], per [facility] Rona Ravens, RN. Patient was referred to the following out of network facilities:   Destination  Service Provider Address Phone Fax  [facility]  [address] [place] [address] [phone]  [facility] [address]  [address], [place] [address] [phone]  [facility] [address] [place]  [address] [place], [place] [address] [phone]  [facility]  [address] [place] [address] [phone]  [facility]  [address] [place] [address] [phone]  [facility]  [address], [place] [address] [phone]  [facility]  [address] [place] [address] [phone]  [facility]  [address], [place] [address] [phone]  [facility]  [place] [phone]  [facility] EFAX  [address] [place], [place] [phone]  [facility]  [address], [place] [address] [phone]  [facility]  [address], [place] [address] [phone]  [facility]  [address] [place], [place] [address] [phone]  [facility]  [address] [place] [address] [phone]  [facility]  [place] [phone]  [facility]  [address] [place] [address] [phone]   [facility] Patient Placement  [facility], [place] [phone]  [facility] [address]  [place] [address] [phone]  [facility]  [address], [place] [address] [phone]  [facility]  [address], [place] [address] [phone]  [facility]  [address], [place] [address] [phone]  [facility]  [address] [place] [address] [phone]  [facility]  [address] [place] [address] [phone]  [facility]  [address] [place] [address] [phone]  [facility]  [address] [place] [address] [phone]    Situation ongoing, CSW to continue following and update chart as more information becomes available.      Cathie Beams, LCSW  03/13/2023 9:52 AM

## 2023-03-13 NOTE — ED Notes (Signed)
This RN spoke to caller from [facility] about pt's referral.  Per caller, pt's case to be presented to their provider around 0530 this morning and caller will call back with further updates.

## 2023-03-14 ENCOUNTER — Encounter (HOSPITAL_COMMUNITY): Payer: Self-pay | Admitting: Psychiatry

## 2023-03-14 DIAGNOSIS — T1491XA Suicide attempt, initial encounter: Secondary | ICD-10-CM

## 2023-03-14 NOTE — Progress Notes (Addendum)
[facility] Psych ED Progress Note  03/14/2023 4:17 PM [name]  MRN:  [id number]   Subjective:   Patient seen today for face-to-face psychiatric reevaluation at the [facility] emergency department.  Upon evaluation, patient endorses continued depressive and anxious mood with passive suicidal ideations.  Patient endorses that he is tolerating medications well, no side effects are appreciable at this time.  Patient endorses eating and sleeping normally, despite medical complications he has endured from suicide attempt prior to admission.  Patient endorses no homicidal ideations, denies auditory visual hallucinations.  Per nursing: Patient compliant with medications, no behavioral problems, no safety concerns.  Principal Problem: MDD (major depressive disorder), recurrent episode, severe (HCC) Diagnosis:  Principal Problem:   MDD (major depressive disorder), recurrent episode, severe (HCC) Active Problems:   Suicide attempt [facility])   ED Assessment Time Calculation: Start Time: 1600 Stop Time: 1617 Total Time in Minutes (Assessment Completion): 17   Past Psychiatric History: As reported  [place] Scale:  Flowsheet Row ED [facility]20[facility] ED from 10/05/2022 in [facility]) ED from 08/18/2022 in [facility] at [facility] RISK CATEGORY High Risk No Risk No Risk       Past Medical History:  Past Medical History:  Diagnosis Date   Depression    Heart murmur    MDD (major depressive disorder)    Suicide attempt (HCC) 03/11/2023   Jumped off a bridge   Thyroid disease     Past Surgical History:  Procedure Laterality Date   ESOPHAGOGASTRODUODENOSCOPY (EGD) WITH PROPOFOL N/A 05/12/2015   Procedure: ESOPHAGOGASTRODUODENOSCOPY (EGD) WITH PROPOFOL;  Surgeon: Willis Modena, MD;  Location: [facility] ENDOSCOPY;  Service: Endoscopy;  Laterality: N/A;   EUS N/A 05/12/2015    Procedure: UPPER ENDOSCOPIC ULTRASOUND (EUS) RADIAL;  Surgeon: Willis Modena, MD;  Location: [facility] ENDOSCOPY;  Service: Endoscopy;  Laterality: N/A;   Family History:  Family History  Problem Relation Age of Onset   Crohn's disease Mother    Asthma Father    Alcohol abuse Father    Family Psychiatric  History: None endorsed Social History:  Social History   Substance and Sexual Activity  Alcohol Use Yes   Comment: social     Social History   Substance and Sexual Activity  Drug Use Yes   Types: Marijuana    Social History   Socioeconomic History   Marital status: Single    Spouse name: Not on file   Number of children: Not on file   Years of education: Not on file   Highest education level: High school graduate  Occupational History   Not on file  Tobacco Use   Smoking status: Every Day    Current packs/day: 1.00    Types: Cigarettes   Smokeless tobacco: Never  Vaping Use   Vaping status: Never Used  Substance and Sexual Activity   Alcohol use: Yes    Comment: social   Drug use: Yes    Types: Marijuana   Sexual activity: Yes    Partners: Female    Birth control/protection: Condom  Other Topics Concern   Not on file  Social History Narrative   Not on file   Social Determinants of Health   Financial Resource Strain: Not on file  Food Insecurity: Not on file  Transportation Needs: Not on file  Physical Activity: Not on file  Stress: Not on file  Social Connections: Not on file  Sleep: Good  Appetite:  Good  Current Medications: Current Facility-Administered Medications  Medication Dose Route Frequency Provider Last Rate Last Admin   ibuprofen (ADVIL) tablet 600 mg  600 mg Oral Q6H Gloris Manchester, MD   600 mg at 03/14/23 1439   lidocaine (LIDODERM) 5 % 1 patch  1 patch Transdermal Q24H Gloris Manchester, MD   1 patch at 03/13/23 2049   methocarbamol (ROBAXIN) tablet 500 mg  500 mg Oral QID Gloris Manchester, MD   500 mg at 03/14/23 1439   ondansetron (ZOFRAN-ODT)  disintegrating tablet 4 mg  4 mg Oral Q8H PRN Gloris Manchester, MD   4 mg at 03/12/23 0217   oxyCODONE-acetaminophen (PERCOCET/ROXICET) 5-325 MG per tablet 1 tablet  1 tablet Oral Q3H PRN Gloris Manchester, MD   1 tablet at 03/13/23 2213   QUEtiapine (SEROQUEL XR) 24 hr tablet 150 mg  150 mg Oral QHS Gloris Manchester, MD   150 mg at 03/13/23 2052   sertraline (ZOLOFT) tablet 150 mg  150 mg Oral Daily Eligha Bridegroom, NP   150 mg at 03/14/23 0901   Current Outpatient Medications  Medication Sig Dispense Refill   QUEtiapine Fumarate (SEROQUEL XR) 150 MG 24 hr tablet Take 1 tablet (150 mg total) by mouth at bedtime. 90 tablet 1   sertraline (ZOLOFT) 100 MG tablet Take 150 mg by mouth daily.      Lab Results: No results found for this or any previous visit (from the past 48 hour(s)).  Blood Alcohol level:  Lab Results  Component Value Date   ETH <10 03/11/2023    Physical Findings:  CIWA:    COWS:     Musculoskeletal: Strength & Muscle Tone: decreased Gait & Station: normal Patient leans: N/A  Psychiatric Specialty Exam:  Presentation  General Appearance:  Appropriate for Environment  Eye Contact: Fair  Speech: Clear and Coherent; Normal Rate  Speech Volume: Normal  Handedness: Right   Mood and Affect  Mood: Depressed; Anxious  Affect: Other (comment) (Neutral with sad edge)   Thought Process  Thought Processes: Goal Directed; Linear  Descriptions of Associations:Intact  Orientation:Full (Time, Place and Person)  Thought Content:Logical  History of Schizophrenia/Schizoaffective disorder:No  Duration of Psychotic Symptoms:N/A  Hallucinations:Hallucinations: None  Ideas of Reference:None  Suicidal Thoughts:Suicidal Thoughts: Yes, Passive SI Passive Intent and/or Plan: Without Intent; Without Plan; Without Means to Carry Out; Without Access to Means  Homicidal Thoughts:Homicidal Thoughts: No   Sensorium  Memory: Immediate Fair; Recent Fair; Remote  Fair  Judgment: Intact  Insight: Present   Executive Functions  Concentration: Good  Attention Span: Good  Recall: Good  Fund of Knowledge: Good  Language: Good   Psychomotor Activity  Psychomotor Activity: Psychomotor Activity: Normal   Assets  Assets: Housing; Desire for Improvement; Communication Skills; Social Support; Physical Health; Resilience; Leisure Time   Sleep  Sleep: Sleep: Good    Physical Exam: Physical Exam Vitals and nursing note reviewed.  Constitutional:      General: He is not in acute distress.    Appearance: Normal appearance. He is not ill-appearing, toxic-appearing or diaphoretic.  Pulmonary:     Effort: Pulmonary effort is normal.  Skin:    General: Skin is warm and dry.  Neurological:     Mental Status: He is alert and oriented to person, place, and time.  Psychiatric:        Attention and Perception: Attention and perception normal.        Mood and Affect: Mood is anxious and  depressed.        Speech: Speech normal.        Behavior: Behavior normal. Behavior is cooperative.        Thought Content: Thought content is not paranoid or delusional. Thought content includes suicidal (Passive thoughts) ideation. Thought content does not include homicidal ideation.        Cognition and Memory: Cognition and memory normal.    Review of Systems  Musculoskeletal:  Positive for back pain, joint pain and myalgias.  Psychiatric/Behavioral:  Positive for depression and suicidal ideas. The patient is nervous/anxious.   All other systems reviewed and are negative.  Blood pressure 109/66, pulse 67, temperature 98.6 F (37 C), temperature source Oral, resp. rate 20, height 5\' 7"  (1.702 m), weight 99.8 kg, SpO2 100%. Body mass index is 34.46 kg/m.   Medical Decision Making:  Patient continues to present with depressive and anxious symptomology, as well as passive suicidal ideations.  Recommendation remains the same, recommend inpatient  psychiatric hospitalization for stability and safety. CSW team has faxed the patient out to appropriate facilities.  Barrier to placement has been medical complications patient has had.  Recommendations  #MDD  -No medication changes at this time -Continue with seeking of disposition for inpatient psychiatric hospitalization -Continue safety precautions  Lenox Ponds, NP 03/14/2023, 4:17 PM

## 2023-03-14 NOTE — ED Notes (Signed)
IVC PAPERWORK CURRENT

## 2023-03-14 NOTE — ED Notes (Signed)
IVC current, expires 03/18/23

## 2023-03-14 NOTE — ED Provider Notes (Signed)
Emergency Medicine Observation Re-evaluation Note  Dominic Bryant is a 27 y.o. male, seen on rounds today.  Pt initially presented to the ED for complaints of Trauma and Suicide Attempt Currently, the patient is resting.Marland Kitchen  Physical Exam  BP 107/66 (BP Location: Right Arm)   Pulse (!) 58   Temp 98.3 F (36.8 C) (Oral)   Resp 18   Ht 1.702 m (5\' 7" )   Wt 99.8 kg   SpO2 100%   BMI 34.46 kg/m  Physical Exam   ED Course / MDM  EKG:EKG Interpretation Date/Time:  Sunday March 11 2023 19:21:48 EDT Ventricular Rate:  96 PR Interval:  140 QRS Duration:  87 QT Interval:  353 QTC Calculation: 447 R Axis:   77  Text Interpretation: Sinus rhythm Normal ECG Confirmed by Geoffery Lyons ([id number]) on 03/12/2023 10:08:02 PM  I have reviewed the labs performed to date as well as medications administered while in observation.  Recent changes in the last 24 hours include none.  Plan  Current plan is for placement.    Lorre Nick, MD 03/14/23 [phone]

## 2023-03-14 NOTE — Progress Notes (Signed)
LCSW Progress Note  [id number]   PATRICK HOSKINSON  03/14/2023  9:23 AM  Description:   Inpatient Psychiatric Referral  Patient was recommended inpatient per Arsenio Loader, NP. There are no available beds at [facility], per [facility] Rona Ravens, RN. Patient was referred to the following out of network facilities:   Destination  Service Provider Address Phone Fax  [facility]  [address], [place] [address] [phone]  [facility] [address]  [address] [place] [address] [phone]  [facility] [place] [address] [place]  [address] [place], [place] [address] [phone]  [facility]  [address] [place] [address] [phone]  [facility]  [address] [place] [address] [phone]  [facility]  [address], [place] [address] [phone]  [facility]  [address], [place] [address] [phone]  [facility]  [address], [place] [address] [phone]  [facility]  [place] [phone]  [facility] EFAX  [address] [place], [place] [phone]  [facility]  [address] [place] [address] [phone]  [facility]  [address], [place] [address] [phone]  [facility]  [address] [place], [place] [address] [phone]  [facility]  [address] [place] [address] [phone]  [facility] Psychiatry Inpatient [facility]  [place] [phone]  [facility]  [address] [place] [address] [phone]   [facility] Patient Placement  [facility], [place] [phone]  [facility] [address]  [place] [address] [phone]  [facility]  [address], [place] [address] [phone]  [facility]  [address] [place] [address] [phone]  [facility]  [address], [place] [address] [phone]  [facility]  1 medical [place] [address] [phone]  [facility]  [address] [place] [address] [phone]  [facility]  [address] [place] [address] [phone]     Situation ongoing, CSW to continue following and update chart as more information becomes available.      Cathie Beams, [place]  03/14/2023 9:23 AM

## 2023-03-14 NOTE — ED Notes (Signed)
PT was lying down in bed, I introduced myself and asked him if he needed anything. He responded with "no" , I informed him to let me know if he needed anything.

## 2023-03-15 MED ORDER — ACETAMINOPHEN 325 MG PO TABS
650.0000 mg | ORAL_TABLET | Freq: Two times a day (BID) | ORAL | Status: DC | PRN
  Administered 2023-03-15 – 2023-03-19 (×4): 650 mg via ORAL
  Filled 2023-03-15 (×5): qty 2

## 2023-03-15 NOTE — Progress Notes (Signed)
Pt continues to meet inpatient BH placement per psych provider Arsenio Loader, NP. Pt was reviewed for inpatient BH placement by [facility] AC Brook McNichol,RN for bed availability within the [facility].  Per assigned nurse Irving Burton Dixon,RN pt's injuries: L1-3 compression fx. Ortho reported there is an abrasion under the splint to his L elbow.  Per Maryjean Morn Pt is currently using [facility] for pain/comfort due to the fractures. the splint is necessary for his elbow fracture.   Per [facility] Brook McNichol,RN due to pt's medical conditions/ recovery pt would require 1 to1 and at time pt is denied due to staffing.  CSW/Disposition team will continue to assist with inpatient [facility] placement.  Care Team notified:[facility] Kae Heller, Dr. Lucianne Muss. MD, Remi Deter, Emily Dixon,RN  Maryjean Ka, MSW, [facility] 03/15/2023 12:02 PM

## 2023-03-15 NOTE — Progress Notes (Signed)
LCSW Progress Note  [id number]   Dominic Bryant  03/15/2023  12:19 PM    Inpatient Behavioral Health Placement  Pt meets inpatient criteria per Arsenio Loader,, NP. There are no available beds [facility] per [facility] AC Brook McNichol,RN. Referral was sent to the following facilities;   Destination  Service Provider Address Phone Fax  [facility]  [address], [place] [address] [phone]  [facility] [address]  [address], [place] [address] [phone]  [facility] [place] [address] [place]  [address] [place], [place] [address] [phone]  [facility]  [address] [place] [address] [phone]  [facility]  [address] [place] [address] [phone]  [facility]  [address] [place] [address] [phone]  [facility]  [address], [place] [address] [phone]  [facility]  [address] [place] [address] [phone]  [facility] BED Management Behavioral Health  [place] [phone]  [facility] EFAX  [address] [place], [place] [phone]  [facility]  [address], [place] [address] [phone]  [facility]  [address], [place] [address] [phone]  [facility]  [address] [place], [place] [address] [phone]  [facility]  [address], [place] [address] [phone]  [facility] Psychiatry Inpatient [facility]  [place] [phone]  [facility]  [address] [place] [address] [phone]   [facility] Patient Placement  [facility], [place] [phone]  [facility] [address]  [place] [address] [phone]  [facility]  [address], [place] [address] [phone]  [facility]  [address], [place] [address] [phone]  [facility]  [address] [place] [address] [phone]  [facility]  [address] medical [place] [address] [phone]  [facility]  [address] [place] [address] [phone]  [facility]  [address] [place] [address] [phone]  [facility]  [address] [place] [address] [phone]  [facility]  [facility] [place] [address] [phone]  [facility]  [address] [place] [address] [phone]  [facility]  [address] [place] [address] [phone]  [facility]  [address] [place], [place] [address] [phone]  [facility]  [address] [place], [place] [address] [phone]  [facility]  [address], [place] [address] [phone]     Situation ongoing,  CSW will follow up.    Maryjean Ka, MSW, [facility] 03/15/2023 12:19 PM

## 2023-03-15 NOTE — ED Provider Notes (Signed)
Emergency Medicine Observation Re-evaluation Note  Dominic Bryant is a 27 y.o. male, seen on rounds today.  Pt initially presented to the ED for complaints of Trauma and Suicide Attempt Currently, the patient is resting. Complaining of low back pain (present since ED admission.  Physical Exam  BP 112/62 (BP Location: Right Arm)   Pulse (!) 57   Temp 97.6 F (36.4 C) (Oral)   Resp 18   Ht 5\' 7"  (1.702 m)   Wt 99.8 kg   SpO2 100%   BMI 34.46 kg/m  Physical Exam General: no acute distress Lungs: normal effort Psych: depressed  ED Course / MDM  EKG:EKG Interpretation Date/Time:  Sunday March 11 2023 19:21:48 EDT Ventricular Rate:  96 PR Interval:  140 QRS Duration:  87 QT Interval:  353 QTC Calculation: 447 R Axis:   77  Text Interpretation: Sinus rhythm Normal ECG Confirmed by Geoffery Lyons ([id number]) on 03/12/2023 10:08:02 PM  I have reviewed the labs performed to date as well as medications administered while in observation.  No recent changes in the last 24 hours.  Plan  Current plan is for psychiatric inpatient admission. Will add tylenol (dosed to keep total with it and percocet below 4000 mg daily).    Pricilla Loveless, MD 03/15/23 [id number]
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
## 2023-03-15 NOTE — ED Notes (Signed)
IVC PAPERWORK STATUS CURRENT

## 2023-03-15 NOTE — Progress Notes (Signed)
Pt meets inpatient behavioral health placement per Shearon Stalls. Referral was sent to out of network providers once again:   Destination  Service Provider Address Phone Fax  [facility]  [address] [place] [address] [phone]  [facility] [address]  [address], [place] [address] [phone]  [facility] [address] [place]  [address] [place], [place] [address] [phone]  [facility]  [address] [place] [address] [phone]  [facility]  [address] [place] [address] [phone]  [facility]  [address] [place] [address] [phone]  [facility]  [address], [place] [address] [phone]  [facility]  [address] [place] [address] [phone]  [facility]  [place] [phone]  [facility] EFAX  [address] [place], [place] [phone]  [facility]  [address], [place] [address] [phone]  [facility]  [address], [place] [address] [phone]  [facility]  [address] [place], [place] [address] [phone]  [facility]  [address], [place] [address] [phone]  [facility] Inpatient [facility]  [place] [phone]  [facility]  [address] [place] [address] [phone]  [facility] Patient Placement  [facility], [place] [phone]  [facility] [address]  [place] [address] [phone]  [facility]  [address], [place] [address] [phone]  [facility]  [address], [place] [address] [phone]  [facility]  [address] [place] [address] [phone]  [facility]  [address] medical [place] [address] [phone]  [facility]  [address] [place] [address] [phone]  [facility]  [address] [place] [address] [phone]  [facility]  [address] [place] [address] [phone]    Maryjean Ka, MSW, [facility] 03/15/2023 1:44 AM

## 2023-03-16 NOTE — ED Notes (Signed)
Pt applied ice pack to back to help manage pain. Pt sitting up in chair watching TV with no complaints at this time.
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
## 2023-03-16 NOTE — ED Notes (Signed)
Pt at desk making phone call.

## 2023-03-16 NOTE — Progress Notes (Signed)
LCSW Progress Note  [id number]   THESEUS MALBROUGH  03/16/2023  1:42 PM  Description:   Inpatient Psychiatric Referral  Patient was recommended inpatient per Ophelia Shoulder, NP. There are no [facility] beds at [facility], per [facility], RN. Patient was referred to the following out of network facilities:   Destination  Service Provider Address Phone Fax  [facility]  [address], [place] [address] [phone]  [facility] [address]  [address] [place] [address] [phone]  [facility] [address] [place]  [address] [place], [place] [address] [phone]  [facility]  [address] [place] [address] [phone]  [facility]  [address] [place] [address] [phone]  [facility]  [address] [place] [address] [phone]  [facility]  [address] [place] [address] [phone]  [facility]  [address] [place] [address] [phone]  [facility]  [place] [phone]  [facility] EFAX  [address] [place], [place] [phone]  [facility]  [address], [place] [address] [phone]  [facility]  [address], [place] [address] [phone]  [facility]  [address] [place], [place] [address] [phone]  [facility]  [address] [place] [address] [phone]  [facility] Inpatient [facility]  [place] [phone]  [facility]  [address] [place] [address] [phone]   [facility] Patient Placement  [facility], [place] [phone]  [facility] [address]  [place] [address] [phone]  [facility]  [address], [place] [address] [phone]  [facility]  [address] [place] [address] [phone]  [facility]  [address], [place] [address] [phone]  [facility]  [address] [place] [address] [phone]  [facility]  [address] [place] [address] [phone]  [facility]  [address] [place] [address] [phone]  [facility]  [address], [place] [address] [phone]  [facility]  [facility] [place]o [place] [address] [phone]  [facility]  [address] [place] [address] [phone]  [facility]  [address], [place] [address] [phone]  [facility]  [address] [place], [place] [address] [phone]  [facility]  [address] [place], [place] [address] [phone]  [facility]  [address] [place] [address] [phone]    Situation ongoing, CSW to continue following and update chart as more information becomes available.      Cathie Beams, LCSW  03/16/2023 1:42 PM

## 2023-03-17 DIAGNOSIS — F332 Major depressive disorder, recurrent severe without psychotic features: Secondary | ICD-10-CM | POA: Diagnosis not present

## 2023-03-17 DIAGNOSIS — T1491XA Suicide attempt, initial encounter: Secondary | ICD-10-CM | POA: Diagnosis not present

## 2023-03-17 MED ORDER — SERTRALINE HCL 100 MG PO TABS
200.0000 mg | ORAL_TABLET | Freq: Every day | ORAL | Status: DC
  Administered 2023-03-18 – 2023-03-20 (×3): 200 mg via ORAL
  Filled 2023-03-17 (×3): qty 2

## 2023-03-17 NOTE — Progress Notes (Signed)
This CSW advised care team that no insurane coverage is a barrier to care team. Per assigned nurse Dominic Bryant pt's mother reported that pt has insurance coverage. Per [place],EN registration will contact pts mother.   Dominic Bryant, MSW, [facility] 03/17/2023 11:34 AM

## 2023-03-17 NOTE — Progress Notes (Cosign Needed Addendum)
[facility] Psych ED Progress Note  03/17/2023 3:05 PM Dominic Bryant  MRN:  [id number]   Subjective:  Patient seen today for a psychiatric reevaluation at the [facility] emergency department.  Upon reevaluation, patient endorses some degree of improvements in his overall mood, however, still endorses anxiety, depression, and passive suicidal ideations.  Patient endorses that appetite has improved, eating good, endorses outside of physical pain from recent suicide attempt, sleeping is fair.  Patient endorses no homicidal ideations, denies auditory and/or visual hallucinations, denies paranoia.  Patient orientation intact upon assessment.  Patient endorsed toleration of his medications, discussed medication increase of the patient's Zoloft, to which patient endorsed he was amenable.  Per nursing: Patient is compliant with medications, no behavioral problems, no safety concerns  Principal Problem: MDD (major depressive disorder), recurrent episode, severe (HCC) Diagnosis:  Principal Problem:   MDD (major depressive disorder), recurrent episode, severe (HCC) Active Problems:   Suicide attempt [facility])   ED Assessment Time Calculation: Start Time: 1450 Stop Time: 1505 Total Time in Minutes (Assessment Completion): 15   Past Psychiatric History: As reported  Gren[place] Scale:  Flowsheet Row ED from 03/11/2023 in [facility] Emergency Department at [facility] ED from 10/05/2022 in [facility] ED from 08/18/2022 in [facility] at [facility] RISK CATEGORY High Risk No Risk No Risk       Past Medical History:  Past Medical History:  Diagnosis Date   Depression    Heart murmur    MDD (major depressive disorder)    Suicide attempt (HCC) 03/11/2023   Jumped off a bridge   Thyroid disease     Past Surgical History:  Procedure Laterality Date   ESOPHAGOGASTRODUODENOSCOPY (EGD) WITH PROPOFOL N/A 05/12/2015   Procedure:  ESOPHAGOGASTRODUODENOSCOPY (EGD) WITH PROPOFOL;  Surgeon: Willis Modena, MD;  [facility];  Service: Endoscopy;  Laterality: N/A;   EUS N/A 05/12/2015   Procedure: UPPER ENDOSCOPIC ULTRASOUND (EUS) RADIAL;  Surgeon: Willis Modena, MD;  [facility];  Service: Endoscopy;  Laterality: N/A;   Family History:  Family History  Problem Relation Age of Onset   Crohn's disease Mother    Asthma Father    Alcohol abuse Father    Family Psychiatric  History: None endorsed Social History:  Social History   Substance and Sexual Activity  Alcohol Use Yes   Comment: social     Social History   Substance and Sexual Activity  Drug Use Yes   Types: Marijuana    Social History   Socioeconomic History   Marital status: Single    Spouse name: Not on file   Number of children: Not on file   Years of education: Not on file   Highest education level: High school graduate  Occupational History   Not on file  Tobacco Use   Smoking status: Every Day    Current packs/day: 1.00    Types: Cigarettes   Smokeless tobacco: Never  Vaping Use   Vaping status: Never Used  Substance and Sexual Activity   Alcohol use: Yes    Comment: social   Drug use: Yes    Types: Marijuana   Sexual activity: Yes    Partners: Female    Birth control/protection: Condom  Other Topics Concern   Not on file  Social History Narrative   Not on file   Social Determinants of Health   Financial Resource Strain: Not on file  Food Insecurity: Not on file  Transportation Needs: Not on file  Physical Activity: Not on file  Stress: Not on file  Social Connections: Not on file    Sleep: Fair  Appetite:  Fair  Current Medications: Current Facility-Administered Medications  Medication Dose Route Frequency Provider Last Rate Last Admin   acetaminophen (TYLENOL) tablet 650 mg  650 mg Oral BID PRN Pricilla Loveless, MD   650 mg at 03/15/23 0956   lidocaine (LIDODERM) 5 % 1 patch  1 patch  Transdermal Q24H Gloris Manchester, MD   1 patch at 03/16/23 2113   ondansetron (ZOFRAN-ODT) disintegrating tablet 4 mg  4 mg Oral Q8H PRN Gloris Manchester, MD   4 mg at 03/12/23 0217   oxyCODONE-acetaminophen (PERCOCET/ROXICET) 5-325 MG per tablet 1 tablet  1 tablet Oral Q3H PRN Gloris Manchester, MD   1 tablet at 03/17/23 1036   QUEtiapine (SEROQUEL XR) 24 hr tablet 150 mg  150 mg Oral QHS Gloris Manchester, MD   150 mg at 03/16/23 2114   sertraline (ZOLOFT) tablet 150 mg  150 mg Oral Daily Eligha Bridegroom, NP   150 mg at 03/17/23 1036   Current Outpatient Medications  Medication Sig Dispense Refill   QUEtiapine Fumarate (SEROQUEL XR) 150 MG 24 hr tablet Take 1 tablet (150 mg total) by mouth at bedtime. 90 tablet 1   sertraline (ZOLOFT) 100 MG tablet Take 150 mg by mouth daily.      Lab Results: No results found for this or any previous visit (from the past 48 hour(s)).  Blood Alcohol level:  Lab Results  Component Value Date   ETH <10 03/11/2023    Physical Findings:  CIWA:    COWS:     Musculoskeletal: Strength & Muscle Tone: decreased Gait & Station: normal Patient leans: N/A  Psychiatric Specialty Exam:  Presentation  General Appearance:  Appropriate for Environment  Eye Contact: Good  Speech: Clear and Coherent; Normal Rate  Speech Volume: Normal  Handedness: Right   Mood and Affect  Mood: Depressed; Anxious  Affect: Other (comment) (Neutral with sad edge)   Thought Process  Thought Processes: Goal Directed; Linear; Coherent  Descriptions of Associations:Intact  Orientation:Full (Time, Place and Person)  Thought Content:Logical  History of Schizophrenia/Schizoaffective disorder:No  Duration of Psychotic Symptoms:N/A  Hallucinations:Hallucinations: None  Ideas of Reference:None  Suicidal Thoughts:Suicidal Thoughts: Yes, Passive SI Passive Intent and/or Plan: Without Intent; Without Plan; Without Means to Carry Out; Without Access to Means  Homicidal  Thoughts:Homicidal Thoughts: No   Sensorium  Memory: Immediate Fair; Recent Fair; Remote Fair  Judgment: Intact  Insight: Present   Executive Functions  Concentration: Good  Attention Span: Good  Recall: Good  Fund of Knowledge: Good  Language: Good   Psychomotor Activity  Psychomotor Activity: Psychomotor Activity: Normal   Assets  Assets: Desire for Improvement; Communication Skills; Housing; Leisure Time; Resilience; Social Support   Sleep  Sleep: Sleep: Fair    Physical Exam: Physical Exam Vitals and nursing note reviewed. Exam conducted with a chaperone present.  Constitutional:      General: He is not in acute distress.    Appearance: He is not ill-appearing, toxic-appearing or diaphoretic.  Pulmonary:     Effort: Pulmonary effort is normal.  Skin:    General: Skin is warm and dry.  Neurological:     Mental Status: He is alert and oriented to person, place, and time.  Psychiatric:        Attention and Perception: Attention and perception normal.  Mood and Affect: Mood is anxious and depressed.        Speech: Speech normal.        Behavior: Behavior normal. Behavior is not agitated, slowed, aggressive, withdrawn, hyperactive or combative. Behavior is cooperative.        Thought Content: Thought content is not paranoid or delusional. Thought content includes suicidal (Passive) ideation. Thought content does not include homicidal ideation. Thought content does not include homicidal or suicidal plan.        Cognition and Memory: Cognition and memory normal.        Judgment: Judgment normal.    Review of Systems  Musculoskeletal:  Positive for back pain and myalgias.  Psychiatric/Behavioral:  Positive for depression and suicidal ideas (Passive). Negative for hallucinations and substance abuse. The patient is nervous/anxious. The patient does not have insomnia.   All other systems reviewed and are negative.  Blood pressure 106/62, pulse  65, temperature 98 F (36.7 C), temperature source Oral, resp. rate 18, height 5\' 7"  (1.702 m), weight 99.8 kg, SpO2 100%. Body mass index is 34.46 kg/m.   Medical Decision Making:  Patient continues to meet inpatient criteria, disposition still in the process of being obtained, barriers to obtaining of disposition remain patient's medical equipment in place due to suicide attempt.  Psychiatry will continue to follow the patient until disposition is obtained.  Recommendations  #MDD  -Continue recommendation for inpatient hospitalization for mental health -Continue/increase Zoloft 150 mg p.o. daily--> Zoloft 200 mg p.o. daily -Continue IVC -Continue safety precautions -Continue Seroquel XR 150 mg nightly   Lenox Ponds, NP 03/17/2023, 3:05 PM

## 2023-03-17 NOTE — Progress Notes (Signed)
LCSW Progress Note  [id number]   TORAN WIMPY  03/17/2023  6:07 PM    Inpatient Behavioral Health Placement  Pt meets inpatient criteria per Arsenio Loader, NP. There are no available beds within [facility] system per [facility] Antoinette Cillo, RN. Referral was sent to the following facilities;   Destination  Service Provider Address Phone Fax  [facility]  [address] [place] [address] [phone]  [facility] [address]  [address] [place] [address] [phone]  [facility] [address] [place]  [address] [place], [place] [address] [phone]  [facility]  [address] [place] [address] [phone]  [facility]  [address] [place] [address] [phone]  [facility]  [address] [place] [address] [phone]  [facility]  [address], [place] [address] [phone]  [facility]  [address], [place] [address] [phone]  [facility]  [place] [phone]  [facility] EFAX  [address] [place], [place] [phone]  [facility]  [address], [place] [address] [phone]  [facility]  [address], [place] [address] [phone]  [facility]  [address] [place], [place] [address] [phone]  [facility]  [address] [place] [address] [phone]  [facility]  [place] [phone]  [facility]  [address] [place] [address] [phone]   [facility] Patient Placement  [facility], [place] [phone]  [facility] [address]  [place] [address] [phone]  [facility]  [address] [place] [address] [phone]  [facility]  [address], [place] [address] [phone]  [facility]  [address], [place] [address] [phone]  [facility]  [address] [place] [address] [phone]  [facility]  [address] [place] [address] [phone]  [facility]  [address] [place] [address] [phone]  [facility]  [address] [place] [address] [phone]  [facility]  [facility] [address] [place] [address] [phone]  [facility]  [address], [place] [address] [phone]  [facility]  [address], [place] [address] [phone]  [facility]  [address] [place], [place] [address] [phone]  [facility]  [address] [place], [place] [address] [phone]  [facility]  [address], [place] [address] [phone]  [facility]  [address], [place] [address] [phone]    Situation ongoing,  CSW will follow up.    Maryjean Ka, MSW, [facility] 03/17/2023 6:07 PM

## 2023-03-17 NOTE — ED Provider Notes (Signed)
Emergency Medicine Observation Re-evaluation Note  Dominic Bryant is a 27 y.o. male, seen on rounds today.  Pt initially presented to the ED for complaints of Trauma and Suicide Attempt Currently, the patient is awake, standing up in his room with LSO brace.  Physical Exam  BP 106/62 (BP Location: Right Arm)   Pulse 65   Temp 98 F (36.7 C) (Oral)   Resp 18   Ht 5\' 7"  (1.702 m)   Wt 99.8 kg   SpO2 100%   BMI 34.46 kg/m  Physical Exam General: Well-developed, no acute distress Lungs: Normal respiratory effort Psych: Awake and interactive  ED Course / MDM  EKG:EKG Interpretation Date/Time:  Sunday March 11 2023 19:21:48 EDT Ventricular Rate:  96 PR Interval:  140 QRS Duration:  87 QT Interval:  353 QTC Calculation: 447 R Axis:   77  Text Interpretation: Sinus rhythm Normal ECG Confirmed by Geoffery Lyons ([id number]) on 03/12/2023 10:08:02 PM  I have reviewed the labs performed to date as well as medications administered while in observation.  Recent changes in the last 24 hours include no recent changes.  Plan  Current plan is for psychiatric placement.  Currently on back brace and splint for left arm fracture.    Laurence Spates, MD 03/17/23 [phone]

## 2023-03-18 NOTE — ED Provider Notes (Signed)
Emergency Medicine Observation Re-evaluation Note  Dominic Bryant is a 27 y.o. male, seen on rounds today.  Pt initially presented to the ED for complaints of Trauma and Suicide Attempt Currently, the patient is resting comfortably in bed.  Physical Exam  BP 118/72 (BP Location: Right Arm)   Pulse 63   Temp 97.8 F (36.6 C) (Oral)   Resp 16   Ht 5\' 7"  (1.702 m)   Wt 99.8 kg   SpO2 100%   BMI 34.46 kg/m  Physical Exam General: Sleeping comfortably, awakens easily Lungs: Normal respiratory effort Psych: Calm, cooperative  ED Course / MDM  EKG:EKG Interpretation Date/Time:  Sunday March 11 2023 19:21:48 EDT Ventricular Rate:  96 PR Interval:  140 QRS Duration:  87 QT Interval:  353 QTC Calculation: 447 R Axis:   77  Text Interpretation: Sinus rhythm Normal ECG Confirmed by Geoffery Lyons ([id number]) on 03/12/2023 10:08:02 PM  I have reviewed the labs performed to date as well as medications administered while in observation.  Recent changes in the last 24 hours include no acute changes.  Plan  Current plan is for inpatient admission awaiting psychiatric facility availability.    Laurence Spates, MD 03/18/23 1150

## 2023-03-18 NOTE — Progress Notes (Addendum)
[facility] Psych ED Progress Note  03/18/2023 2:48 PM Dominic Bryant  MRN:  [id number]   Subjective:   Patient seen today at at the [facility] emergency department for face-to-face psychiatric reevaluation.    Upon reevaluation, patient endorses good toleration of dose increased of his Zoloft, however, states mood is not improving at all being sequestered to the psychiatric holding area of the emergency department for so many days without being accepted for inpatient hospitalization.  Patient endorses continued difficulties with depression and passive suicidal ideations.  Patient endorses fair sleep outside of physical pain he experiences at times from complications from suicide attempt.  Patient endorses appetite is fair, eating most of his meals.  Patient orientation intact.  Patient does not endorse any auditory and/or visual hallucinations.   Per nursing: Compliant with medications, no behavioral problems, no safety concern  Principal Problem: MDD (major depressive disorder), recurrent episode, severe (HCC) Diagnosis:  Principal Problem:   MDD (major depressive disorder), recurrent episode, severe (HCC) Active Problems:   Suicide attempt [facility])   ED Assessment Time Calculation: Start Time: 1400 Stop Time: 1415 Total Time in Minutes (Assessment Completion): 15   Past Psychiatric History: As reported  [place] Scale:  Flowsheet Row ED from [facility]11[facility]2023 [facility] ED from 10/05/2022 in [facility] ED from 08/18/2022 in [facility] RISK CATEGORY High Risk No Risk No Risk       Past Medical History:  Past Medical History:  Diagnosis Date   Depression    Heart murmur    MDD (major depressive disorder)    Suicide attempt (HCC) 03/11/2023   Jumped off a bridge   Thyroid disease     Past Surgical History:  Procedure Laterality Date   ESOPHAGOGASTRODUODENOSCOPY  (EGD) WITH PROPOFOL N/A 05/12/2015   Procedure: ESOPHAGOGASTRODUODENOSCOPY (EGD) WITH PROPOFOL;  Surgeon: Willis Modena, MD;  [facility];  Service: Endoscopy;  Laterality: N/A;   EUS N/A 05/12/2015   Procedure: UPPER ENDOSCOPIC ULTRASOUND (EUS) RADIAL;  Surgeon: Willis Modena, MD;  [facility];  Service: Endoscopy;  Laterality: N/A;   Family History:  Family History  Problem Relation Age of Onset   Crohn's disease Mother    Asthma Father    Alcohol abuse Father    Family Psychiatric  History: None endorsed Social History:  Social History   Substance and Sexual Activity  Alcohol Use Yes   Comment: social     Social History   Substance and Sexual Activity  Drug Use Yes   Types: Marijuana    Social History   Socioeconomic History   Marital status: Single    Spouse name: Not on file   Number of children: Not on file   Years of education: Not on file   Highest education level: High school graduate  Occupational History   Not on file  Tobacco Use   Smoking status: Every Dominic    Current packs/Dominic: 1.00    Types: Cigarettes   Smokeless tobacco: Never  Vaping Use   Vaping status: Never Used  Substance and Sexual Activity   Alcohol use: Yes    Comment: social   Drug use: Yes    Types: Marijuana   Sexual activity: Yes    Partners: Female    Birth control/protection: Condom  Other Topics Concern   Not on file  Social History Narrative   Not on file   Social Determinants  of Health   Financial Resource Strain: Not on file  Food Insecurity: Not on file  Transportation Needs: Not on file  Physical Activity: Not on file  Stress: Not on file  Social Connections: Not on file    Sleep: Fair  Appetite:  Fair  Current Medications: Current Facility-Administered Medications  Medication Dose Route Frequency Provider Last Rate Last Admin   acetaminophen (TYLENOL) tablet 650 mg  650 mg Oral BID PRN Pricilla Loveless, MD   650 mg at 03/18/23 0927    lidocaine (LIDODERM) 5 % 1 patch  1 patch Transdermal Q24H Gloris Manchester, MD   1 patch at 03/17/23 2158   ondansetron (ZOFRAN-ODT) disintegrating tablet 4 mg  4 mg Oral Q8H PRN Gloris Manchester, MD   4 mg at 03/12/23 0217   oxyCODONE-acetaminophen (PERCOCET/ROXICET) 5-325 MG per tablet 1 tablet  1 tablet Oral Q3H PRN Gloris Manchester, MD   1 tablet at 03/18/23 1323   QUEtiapine (SEROQUEL XR) 24 hr tablet 150 mg  150 mg Oral QHS Gloris Manchester, MD   150 mg at 03/17/23 2159   sertraline (ZOLOFT) tablet 200 mg  200 mg Oral Daily Lenox Ponds, NP   200 mg at 03/18/23 1610   Current Outpatient Medications  Medication Sig Dispense Refill   QUEtiapine Fumarate (SEROQUEL XR) 150 MG 24 hr tablet Take 1 tablet (150 mg total) by mouth at bedtime. 90 tablet 1   sertraline (ZOLOFT) 100 MG tablet Take 150 mg by mouth daily.      Lab Results: No results found for this or any previous visit (from the past 48 hour(s)).  Blood Alcohol level:  Lab Results  Component Value Date   ETH <10 03/11/2023    Physical Findings:  CIWA:    COWS:     Musculoskeletal: Strength & Muscle Tone: decreased Gait & Station: normal Patient leans: N/A  Psychiatric Specialty Exam:  Presentation  General Appearance:  Appropriate for Environment  Eye Contact: Good  Speech: Clear and Coherent; Normal Rate  Speech Volume: Normal  Handedness: Right   Mood and Affect  Mood: Depressed  Affect: Other (comment) (Neutral with sad edge)   Thought Process  Thought Processes: Linear; Goal Directed; Coherent  Descriptions of Associations:Intact  Orientation:Full (Time, Place and Person)  Thought Content:Logical  History of Schizophrenia/Schizoaffective disorder:No  Duration of Psychotic Symptoms:N/A  Hallucinations:Hallucinations: None  Ideas of Reference:None  Suicidal Thoughts:Suicidal Thoughts: Yes, Passive SI Passive Intent and/or Plan: With Intent; Without Intent; Without Means to Carry Out; Without  Access to Means  Homicidal Thoughts:Homicidal Thoughts: No   Sensorium  Memory: Immediate Good; Recent Good; Remote Good  Judgment: Intact  Insight: Present   Executive Functions  Concentration: Good  Attention Span: Good  Recall: Good  Fund of Knowledge: Good  Language: Good   Psychomotor Activity  Psychomotor Activity: Psychomotor Activity: Normal   Assets  Assets: Desire for Improvement; Communication Skills; Social Support   Sleep  Sleep: Sleep: Fair    Physical Exam: Physical Exam Vitals and nursing note reviewed.  Constitutional:      General: He is not in acute distress.    Appearance: He is not ill-appearing, toxic-appearing or diaphoretic.  Pulmonary:     Effort: Pulmonary effort is normal.  Skin:    General: Skin is warm and dry.  Neurological:     Mental Status: He is alert and oriented to person, place, and time.  Psychiatric:        Attention and Perception: Attention and perception normal.  He does not perceive auditory or visual hallucinations.        Mood and Affect: Mood is depressed.        Speech: Speech normal.        Behavior: Behavior normal. Behavior is cooperative.        Thought Content: Thought content is not paranoid or delusional. Thought content includes suicidal (Passive) ideation. Thought content does not include homicidal ideation.        Cognition and Memory: Cognition and memory normal.    Review of Systems  Musculoskeletal:  Positive for back pain and myalgias.  Psychiatric/Behavioral:  Positive for depression (Passive) and suicidal ideas.   All other systems reviewed and are negative.  Blood pressure 116/81, pulse 83, temperature 98.1 F (36.7 C), temperature source Oral, resp. rate 17, height 5\' 7"  (1.702 m), weight 99.8 kg, SpO2 99%. Body mass index is 34.46 kg/m.   Medical Decision Making:  Patient continues to meet inpatient criteria, disposition still in the process of being obtained, barriers  remain medical equipment the patient has in place due to suicide attempt prior to admission. Psychiatry will continue to follow the patient until disposition is obtained.  Recommendations  #MDD   -Continue recommendation for inpatient hospitalization for mental health -Continue Zoloft 200 mg p.o. daily -Continue IVC -Continue safety precautions -Continue Seroquel XR 150 mg nightly  Lenox Ponds, NP 03/18/2023, 2:48 PM

## 2023-03-18 NOTE — Progress Notes (Signed)
LCSW Progress Note  [id number]   Dominic Bryant  03/18/2023  5:59 PM    Inpatient Behavioral Health Placement  Pt meets inpatient criteria per Arsenio Loader, NP. There are no available beds within [facility] system per [facility] Brook McNichol,. Referral was sent to the following facilities;   Destination  Service Provider Address Phone Fax  [facility]  [address], [place] [address] [phone]  [facility] [address]  [address], [place] [address] [phone]  [facility] [address] [place]  [address] [place], [place] [address] [phone]  [facility]  [address] [place] [address] [phone]  [facility]  [address] [place] [address] [phone]  [facility]  [address] [place] [address] [phone]  [facility]  [address] [place] [address] [phone]  [facility]  [address] [place] [address] [phone]  [facility] Behavioral Health  [place] [phone]  [facility] EFAX  [address] [place], [place] [phone]  [facility]  [address] [place] [address] [phone]  [facility]  [address], [place] [address] [phone]  [facility]  [address] [place], [place] [address] [phone]  [facility]  [address] [place] [address] [phone]  [facility]  [place] [phone]  [facility]  [address] [place] [address] [phone]   [facility] Patient Placement  [facility], [place] [phone]  [facility] [address]  [place] [address] [phone]  [facility]  [address] [place] [address] [phone]  [facility]  [address], [place] [address] [phone]  [facility]  [address], [place] [address] [phone]  [facility]  1 medical [place] [address] [phone]  [facility]  [address] [place] [address] [phone]  [facility]  [address] [place] [address] [phone]  [facility]  [address], [place] [address] [phone]  [facility]  [facility] Regino Bellow [place] [address] [phone]  [facility]  [address], [place] [address] [phone]  [facility]  [address] [place] [address] [phone]  [facility]  [address] [place], [place] [address] [phone]  [facility]  [address] [place], [place] [address] [phone]  [facility]  [address] [place] [address] [phone]  [facility]  [address], [place] [address] [phone]    Situation ongoing,  CSW will follow up.    Maryjean Ka, MSW, [facility] 03/18/2023 5:59 PM

## 2023-03-19 NOTE — Progress Notes (Signed)
[facility] ED Progress Note  03/19/2023 2:52 PM Dominic Bryant  MRN:  [id number]   Subjective:   [facility] seen at [facility] ED for face-to-face psychiatric reevaluation.  Patient has now been waiting in the emergency department for around 8 days for acceptance into an inpatient treatment facility.  Unfortunately due to the patient's injuries requiring a back brace, arm cast, and arm sling this has created a barrier for transfer.  Patient has been treated and followed up by psychiatry daily while in the emergency department.  His Zoloft has been increased to 200 mg daily and continued Seroquel at 150 mg nightly.  Patient has been compliant with medications daily and has noticed an improvement in his mood.  However patient does express feeling depressed and irritable while remaining in the emergency department with no updates on transfer.  He does not feel like remaining in the ED is helpful for him anymore.  Patient is feeling safe to discharge home.  He denies any suicidal or homicidal ideations.  Denies any auditory visual hallucinations.  He reports improved sleep, no problems with appetite.  He is able to contract for safety and feels ready to return home.  After speaking with patient I understand why he would not want to remain in the ED any further.  He did agree to remain in the emergency department today as we continue to seek inpatient treatment.  If there is still no available inpatient beds we can consider discharge tomorrow if patient continues to not feel suicidal and able to contract for safety.  Patient did give me permission to contact his mom, who he lives with, tomorrow to discuss possible discharge and safety planning to return home.  At this time we will continue to look for inpatient treatment facility  in the meantime.  Principal Problem: MDD (major depressive disorder), recurrent episode, severe (HCC) Diagnosis:  Principal Problem:   MDD (major depressive disorder), recurrent  episode, severe (HCC) Active Problems:   Suicide attempt [facility])   ED Assessment Time Calculation: Start Time: 1430 Stop Time: 1455 Total Time in Minutes (Assessment Completion): 25   [place] Scale:  Flowsheet Row ED from 03[facility]11/2023 in [facility] at [facility] ED from 10/05/2022 in [facility]) ED from 08/18/2022 in [facility] at [facility] RISK CATEGORY High Risk No Risk No Risk       Past Medical History:  Past Medical History:  Diagnosis Date   Depression    Heart murmur    MDD (major depressive disorder)    Suicide attempt (HCC) 03/11/2023   Jumped off a bridge   Thyroid disease     Past Surgical History:  Procedure Laterality Date   ESOPHAGOGASTRODUODENOSCOPY (EGD) WITH PROPOFOL N/A 05/12/2015   Procedure: ESOPHAGOGASTRODUODENOSCOPY (EGD) WITH PROPOFOL;  Surgeon: Willis Modena, MD;  Location: [facility] ENDOSCOPY;  Service: Endoscopy;  Laterality: N/A;   EUS N/A 05/12/2015   Procedure: UPPER ENDOSCOPIC ULTRASOUND (EUS) RADIAL;  Surgeon: Willis Modena, MD;  Location: [facility] ENDOSCOPY;  Service: Endoscopy;  Laterality: N/A;   Family History:  Family History  Problem Relation Age of Onset   Crohn's disease Mother    Asthma Father    Alcohol abuse Father    Social History:  Social History   Substance and Sexual Activity  Alcohol Use Yes   Comment: social     Social History   Substance and Sexual Activity  Drug Use Yes   Types:  Marijuana    Social History   Socioeconomic History   Marital status: Single    Spouse name: Not on file   Number of children: Not on file   Years of education: Not on file   Highest education level: High school graduate  Occupational History   Not on file  Tobacco Use   Smoking status: Every Day    Current packs/day: 1.00    Types: Cigarettes   Smokeless tobacco: Never  Vaping Use   Vaping status: Never Used  Substance and Sexual Activity    Alcohol use: Yes    Comment: social   Drug use: Yes    Types: Marijuana   Sexual activity: Yes    Partners: Female    Birth control/protection: Condom  Other Topics Concern   Not on file  Social History Narrative   Not on file   Social Determinants of Health   Financial Resource Strain: Not on file  Food Insecurity: Not on file  Transportation Needs: Not on file  Physical Activity: Not on file  Stress: Not on file  Social Connections: Not on file    Sleep: Good  Appetite:  Good  Current Medications: Current Facility-Administered Medications  Medication Dose Route Frequency Provider Last Rate Last Admin   acetaminophen (TYLENOL) tablet 650 mg  650 mg Oral BID PRN Pricilla Loveless, MD   650 mg at 03/19/23 0950   lidocaine (LIDODERM) 5 % 1 patch  1 patch Transdermal Q24H Gloris Manchester, MD   1 patch at 03/18/23 2249   ondansetron (ZOFRAN-ODT) disintegrating tablet 4 mg  4 mg Oral Q8H PRN Gloris Manchester, MD   4 mg at 03/12/23 0217   oxyCODONE-acetaminophen (PERCOCET/ROXICET) 5-325 MG per tablet 1 tablet  1 tablet Oral Q3H PRN Gloris Manchester, MD   1 tablet at 03/18/23 2250   QUEtiapine (SEROQUEL XR) 24 hr tablet 150 mg  150 mg Oral QHS Gloris Manchester, MD   150 mg at 03/18/23 2200   sertraline (ZOLOFT) tablet 200 mg  200 mg Oral Daily Lenox Ponds, NP   200 mg at 03/19/23 5784   Current Outpatient Medications  Medication Sig Dispense Refill   QUEtiapine Fumarate (SEROQUEL XR) 150 MG 24 hr tablet Take 1 tablet (150 mg total) by mouth at bedtime. 90 tablet 1   sertraline (ZOLOFT) 100 MG tablet Take 150 mg by mouth daily.      Lab Results: No results found for this or any previous visit (from the past 48 hour(s)).  Blood Alcohol level:  Lab Results  Component Value Date   ETH <10 03/11/2023    Physical Findings:  CIWA:    COWS:     Musculoskeletal: Strength & Muscle Tone: within normal limits Gait & Station: normal Patient leans: N/A  Psychiatric Specialty  Exam:  Presentation  General Appearance:  Appropriate for Environment  Eye Contact: Good  Speech: Clear and Coherent  Speech Volume: Normal  Handedness: Right   Mood and Affect  Mood: Anxious  Affect: Congruent   Thought Process  Thought Processes: Coherent; Goal Directed  Descriptions of Associations:Intact  Orientation:Full (Time, Place and Person)  Thought Content:Logical; WDL  History of Schizophrenia/Schizoaffective disorder:No  Duration of Psychotic Symptoms:N/A  Hallucinations:Hallucinations: None  Ideas of Reference:None  Suicidal Thoughts:Suicidal Thoughts: No SI Passive Intent and/or Plan: With Intent; Without Intent; Without Means to Carry Out; Without Access to Means  Homicidal Thoughts:Homicidal Thoughts: No   Sensorium  Memory: Immediate Fair; Recent Fair  Judgment: Good  Insight: Good  Executive Functions  Concentration: Good  Attention Span: Good  Recall: Good  Fund of Knowledge: Good  Language: Good   Psychomotor Activity  Psychomotor Activity: Psychomotor Activity: Normal   Assets  Assets: Desire for Improvement; Physical Health; Social Support   Sleep  Sleep: Sleep: Good    Physical Exam: Physical Exam Neurological:     Mental Status: He is alert and oriented to person, place, and time.  Psychiatric:        Attention and Perception: Attention normal.        Mood and Affect: Mood is anxious.        Speech: Speech normal.        Behavior: Behavior is cooperative.        Thought Content: Thought content normal.    Review of Systems  Psychiatric/Behavioral:  The patient is nervous/anxious.   All other systems reviewed and are negative.  Blood pressure 106/60, pulse 62, temperature (!) 97.5 F (36.4 C), temperature source Oral, resp. rate 18, height 5\' 7"  (1.702 m), weight 99.8 kg, SpO2 100%. Body mass index is 34.46 kg/m.   Medical Decision Making: Pt case reviewed and discussed with  Dr. Lucianne Muss. At this time we will continue to search and reach out to inpatient treatment facilities. However, if there is still no availability tomorrow, and patient continues to deny SI/HI/AVH we can consider possible discharge since patient has expressed numerous improvements and is requesting to return home. Will contact his mother tomorrow for additional feedback.     Eligha Bridegroom, NP 03/19/2023, 2:52 PM

## 2023-03-19 NOTE — ED Provider Notes (Signed)
Emergency Medicine Observation Re-evaluation Note  Dominic Bryant is a 27 y.o. male, seen on rounds today.  Pt initially presented to the ED for complaints of Trauma and Suicide Attempt Currently, the patient is calm, awaiting placement.  Physical Exam  BP 106/60 (BP Location: Right Arm)   Pulse 62   Temp (!) 97.5 F (36.4 C) (Oral)   Resp 18   Ht 5\' 7"  (1.702 m)   Wt 99.8 kg   SpO2 100%   BMI 34.46 kg/m  Physical Exam General: NAD Cardiac: RR Lungs: even unlabored Psych: calm, normal affect  ED Course / MDM  EKG:EKG Interpretation Date/Time:  Sunday March 11 2023 19:21:48 EDT Ventricular Rate:  96 PR Interval:  140 QRS Duration:  87 QT Interval:  353 QTC Calculation: 447 R Axis:   77  Text Interpretation: Sinus rhythm Normal ECG Confirmed by Geoffery Lyons ([id number]) on 03/12/2023 10:08:02 PM  I have reviewed the labs performed to date as well as medications administered while in observation.  Recent changes in the last 24 hours include none.  Discussed olecranon fracture that occurred one week ago with Charma Igo PA-C.  Patient requesting new splint--given injury 9/15 we can now place in cast.  If he is still in ED in another 7-10 days can do follow up then, otherwise can follow up as outpatient regarding his injury.   Plan  Current plan is for awaiting inpatient treatment. Cast placement, outpatient Ortho follow up but if still in ED in one week can discuss again with orthopedics in the ED.    Alvira Monday, MD 03/20/23 1249

## 2023-03-19 NOTE — Progress Notes (Signed)
LCSW Progress Note  [id number]   Dominic Bryant  03/19/2023  2:22 PM  Description:   Inpatient Psychiatric Referral  Patient was recommended inpatient per Eligha Bridegroom, NP. There are no [facility] beds at [facility], per [facility] Rona Ravens, RN. Patient was referred to the following out of network facilities:   Destination  Service Provider Address Phone Fax  [facility]  [address], [place] [address] [phone]  [facility] [address]  [address] [place] [address] [phone]  [facility] [address] [place]  [address] [place], [place] [address] [phone]  [facility]  [address] [place] [address] [phone]  [facility]  [address] [place] [address] [phone]  [facility]  [address] [place] [address] [phone]  [facility]  [address], [place] [address] [phone]  [facility]  [address], [place] [address] [phone]  [facility] BED Management Behavioral Health  [place] [phone]  [facility] EFAX  [address] [place], [place] [phone]  [facility]  [address] [place] [address] [phone]  [facility]  [address], [place] [address] [phone]  [facility]  [address] [place], [place] [address] [phone]  [facility]  [address] [place] [address] [phone]  [facility]iatry Inpatient [facility]  [place] [phone]  [facility]  [address] [place] [address] [phone]   [facility] Patient Placement  [facility], [place] [phone]  [facility] [address]  [place] [address] [phone]  [facility]  [address] [place] [address] [phone]  [facility]  [address], [place] [address] [phone]  [facility]  [address] [place] [address] [phone]  [facility]  1 medical [place] [address] [phone]  [facility]  [address] [place] [address] [phone]  [facility]  [address] [place] [address] [phone]  [facility]  [address], [place] [address] [phone]  [facility]  [facility] Regino Bellow [place] [address] [phone]  [facility]  [address], [place] [address] [phone]  [facility]  [address], [place] [address] [phone]  [facility]  [address] [place], [place] [address] [phone]  [facility]  [address] [place], [place] [address] [phone]  [facility]  [address] [place] [address] [phone]  [facility]  [address] [place] [address] [phone]    Situation ongoing, CSW to continue following and update chart as more information becomes available.      Cathie Beams, LCSW  03/19/2023 2:22 PM

## 2023-03-19 NOTE — Progress Notes (Signed)
Orthopedic Tech Progress Note Patient Details:  Dominic Bryant 09-19-1995 [id number]  Casting Type of Cast: Long arm cast Cast Location: LUE Cast Material: Fiberglass Cast Intervention: Application  Post Interventions Patient Tolerated: Well Instructions Provided: Care of device   Tonye Pearson 03/19/2023, 10:03 AM

## 2023-03-20 DIAGNOSIS — F332 Major depressive disorder, recurrent severe without psychotic features: Secondary | ICD-10-CM

## 2023-03-20 MED ORDER — QUETIAPINE FUMARATE ER 150 MG PO TB24: 150.0000 mg | ORAL_TABLET | Freq: Every day | ORAL | 0 refills | Status: DC

## 2023-03-20 MED ORDER — SERTRALINE HCL 100 MG PO TABS: 200.0000 mg | ORAL_TABLET | Freq: Every day | ORAL | 0 refills | Status: DC

## 2023-03-20 NOTE — ED Notes (Signed)
Pt on the phone with his mother at this time

## 2023-03-20 NOTE — Discharge Summary (Signed)
[facility] Psych ED Discharge  03/20/2023 10:47 AM ONTARIO WIEBER  MRN:  [id number]  Principal Problem: MDD (major depressive disorder), recurrent episode, severe (HCC) Discharge Diagnoses: Principal Problem:   MDD (major depressive disorder), recurrent episode, severe (HCC) Active Problems:   Suicide attempt [facility])  Clinical Impression:  Final diagnoses:  Suicide attempt (HCC)  Fall, initial encounter  Closed fracture of olecranon process of left ulna, initial encounter  Closed compression fracture of L1 lumbar vertebra, initial encounter (HCC)  Closed compression fracture of L2 lumbar vertebra, initial encounter (HCC)  Closed compression fracture of L3 lumbar vertebra, initial encounter (HCC)   Subjective:  Pt seen at [facility] for face to face psychiatric reevaluation. Pt has bright affect, pleasant, engages well. Pt is wanting to discharge home today, he spoke with his mom yesterday and feels good about his discharge plans. Pt denies any suicidal ideations. Denies HI. Denies AVH. He does feel improvements from medication compliance and adjustments while in the ED. He is more encouraged and motivated to remain compliant with medications post discharge. Pt does not currently have a therapist or OP provider, we spoke about the necessity of this and he plans on calling placed to establish follow up this week. Resources were left in AVS for counselors and OP clinics. Pt is looking forward to leaving the hospital today and "resuming his life." He denies access to weapons or firearms. He is able to contract for safety at this time. He did give me permission to contact his mother for additional safety planning.   I spoke with his mother whom he lives with, Lambert Mody at [phone]. She is pleasant, states they had a great phone call yesterday and feels comfortable with him coming home today. She was made aware of need for OP follow up and the resources that will be available in his AVS. Also educated her on  medication changes. She stated she is going to be more active with his medications and helping to ensure compliance. She has no additional safety concerns at this moment, and will be here around 11 am to pick up the patient.    ED Assessment Time Calculation: Start Time: 1000 Stop Time: 1040 Total Time in Minutes (Assessment Completion): 40   Past Psychiatric History: MDD, bipolar   Past Medical History:  Past Medical History:  Diagnosis Date   Depression    Heart murmur    MDD (major depressive disorder)    Suicide attempt (HCC) 03/11/2023   Jumped off a bridge   Thyroid disease     Past Surgical History:  Procedure Laterality Date   ESOPHAGOGASTRODUODENOSCOPY (EGD) WITH PROPOFOL N/A 05/12/2015   Procedure: ESOPHAGOGASTRODUODENOSCOPY (EGD) WITH PROPOFOL;  Surgeon: Willis Modena, MD;  Location: [facility];  Service: Endoscopy;  Laterality: N/A;   EUS N/A 05/12/2015   Procedure: UPPER ENDOSCOPIC ULTRASOUND (EUS) RADIAL;  Surgeon: Willis Modena, MD;  Location: [facility];  Service: Endoscopy;  Laterality: N/A;   Family History:  Family History  Problem Relation Age of Onset   Crohn's disease Mother    Asthma Father    Alcohol abuse Father    Family Psychiatric  History: unknown Social History:  Social History   Substance and Sexual Activity  Alcohol Use Yes   Comment: social     Social History   Substance and Sexual Activity  Drug Use Yes   Types: Marijuana    Social History   Socioeconomic History   Marital status: Single    Spouse name: Not on file  Number of children: Not on file   Years of education: Not on file   Highest education level: High school graduate  Occupational History   Not on file  Tobacco Use   Smoking status: Every Day    Current packs/day: 1.00    Types: Cigarettes   Smokeless tobacco: Never  Vaping Use   Vaping status: Never Used  Substance and Sexual Activity   Alcohol use: Yes    Comment: social   Drug use: Yes     Types: Marijuana   Sexual activity: Yes    Partners: Female    Birth control/protection: Condom  Other Topics Concern   Not on file  Social History Narrative   Not on file   Social Determinants of Health   Financial Resource Strain: Not on file  Food Insecurity: Not on file  Transportation Needs: Not on file  Physical Activity: Not on file  Stress: Not on file  Social Connections: Not on file    Tobacco Cessation:  N/A, patient does not currently use tobacco products  Current Medications: Current Facility-Administered Medications  Medication Dose Route Frequency Provider Last Rate Last Admin   acetaminophen (TYLENOL) tablet 650 mg  650 mg Oral BID PRN Pricilla Loveless, MD   650 mg at 03/19/23 2149   lidocaine (LIDODERM) 5 % 1 patch  1 patch Transdermal Q24H Gloris Manchester, MD   1 patch at 03/19/23 2149   ondansetron (ZOFRAN-ODT) disintegrating tablet 4 mg  4 mg Oral Q8H PRN Gloris Manchester, MD   4 mg at 03/12/23 0217   oxyCODONE-acetaminophen (PERCOCET/ROXICET) 5-325 MG per tablet 1 tablet  1 tablet Oral Q3H PRN Gloris Manchester, MD   1 tablet at 03/18/23 2250   QUEtiapine (SEROQUEL XR) 24 hr tablet 150 mg  150 mg Oral QHS Gloris Manchester, MD   150 mg at 03/19/23 2149   sertraline (ZOLOFT) tablet 200 mg  200 mg Oral Daily Lenox Ponds, NP   200 mg at 03/20/23 1610   Current Outpatient Medications  Medication Sig Dispense Refill   QUEtiapine (SEROQUEL XR) 150 MG 24 hr tablet Take 1 tablet (150 mg total) by mouth at bedtime. 30 tablet 0   [START ON 03/21/2023] sertraline (ZOLOFT) 100 MG tablet Take 2 tablets (200 mg total) by mouth daily. 60 tablet 0   PTA Medications: (Not in a hospital admission)   [place] Scale:  Flowsheet Row ED from 03/11/2023 in [facility] Emergency Department at [facility] ED from 10/05/2022 in [facility] at [facility] ED from 08/18/2022 in [facility] Care at [facility] RISK CATEGORY High Risk No Risk No Risk        Musculoskeletal: Strength & Muscle Tone: within normal limits Gait & Station: normal Patient leans: N/A  Psychiatric Specialty Exam: Presentation  General Appearance:  Appropriate for Environment  Eye Contact: Good  Speech: Clear and Coherent  Speech Volume: Normal  Handedness: Right   Mood and Affect  Mood: Euthymic  Affect: Congruent   Thought Process  Thought Processes: Coherent; Goal Directed  Descriptions of Associations:Intact  Orientation:Full (Time, Place and Person)  Thought Content:WDL  History of Schizophrenia/Schizoaffective disorder:No  Duration of Psychotic Symptoms:N/A  Hallucinations:Hallucinations: None  Ideas of Reference:None  Suicidal Thoughts:Suicidal Thoughts: No  Homicidal Thoughts:Homicidal Thoughts: No   Sensorium  Memory: Immediate Fair; Recent Fair  Judgment: Good  Insight: Good   Executive Functions  Concentration: Good  Attention Span: Good  Recall: Good  Fund of Knowledge:  Good  Language: Good   Psychomotor Activity  Psychomotor Activity: Psychomotor Activity: Normal   Assets  Assets: Desire for Improvement; Physical Health; Resilience; Social Support; Housing; Communication Skills   Sleep  Sleep: Sleep: Good    Physical Exam: Physical Exam Neurological:     Mental Status: He is alert and oriented to person, place, and time.  Psychiatric:        Attention and Perception: Attention normal.        Mood and Affect: Mood normal.        Speech: Speech normal.        Behavior: Behavior is cooperative.        Thought Content: Thought content normal.    Review of Systems  Constitutional:        Left arm cast and sling, injuries to back and left arm  Musculoskeletal:  Positive for back pain.  All other systems reviewed and are negative.  Blood pressure 102/66, pulse (!) 57, temperature 98.1 F (36.7 C), temperature source Oral, resp. rate 16, height 5\' 7"  (1.702 m),  weight 99.8 kg, SpO2 98%. Body mass index is 34.46 kg/m.   Demographic Factors:  Male  Loss Factors: NA  Historical Factors: Prior suicide attempts and Impulsivity  Risk Reduction Factors:   Living with another person, especially a relative, Positive social support, Positive therapeutic relationship, and Positive coping skills or problem solving skills  Continued Clinical Symptoms:  Depression:   Impulsivity Severe  Cognitive Features That Contribute To Risk:  None    Suicide Risk:  Mild:  Suicidal ideation of limited frequency, intensity, duration, and specificity.  There are no identifiable plans, no associated intent, mild dysphoria and related symptoms, good self-control (both objective and subjective assessment), few other risk factors, and identifiable protective factors, including available and accessible social support.   Follow-up Information     [facility]. Schedule an appointment as soon as possible for a visit in 2 weeks.   Specialty: Neurosurgery Why: For reassessment of your back injury. Contact information: [address] [place] [address] [phone]         Schedule an appointment as soon as possible for a visit  with Luci Bank, MD.   Specialty: Orthopedic Surgery Why: For reassessment of your elbow injury. Contact information: [address] [place] [address] [phone]                 Plan Of Care/Follow-up recommendations:  Other:  please establish outpatient follow up  Medical Decision Making: Pt case reviewed and discussed with Dr. Lucianne Muss. Pt has remained in the ED for around 9 days, no behavioral incidents or unsafe behaviors. Pt has been compliant with medications, and has had noticeable improvements. Pt is able to contract for safety and requesting discharge home. Mother has no further safety concerns at this time. Will psych clear patient for discharge.   -  Continue Zoloft 200 mg daily - Continue Seroquel XR 150 mg at bedtime - 30 day supply of medications sent to preferred pharmacy - Resources provided in AVS  Disposition: Discharge  Eligha Bridegroom, NP 03/20/2023, 10:47 AM

## 2023-03-20 NOTE — ED Provider Notes (Signed)
Emergency Medicine Observation Re-evaluation Note  Dominic Bryant is a 27 y.o. male, seen on rounds today.  Pt initially presented to the ED for complaints of Trauma and Suicide Attempt Currently, the patient is awaiting psych disposition.  Physical Exam  BP 102/66 (BP Location: Right Arm)   Pulse (!) 57   Temp 98.1 F (36.7 C) (Oral)   Resp 16   Ht 5\' 7"  (1.702 m)   Wt 99.8 kg   SpO2 98%   BMI 34.46 kg/m  Physical Exam General: Calm Cardiac: Well perfused Lungs: Even respirations Psych: Calm  ED Course / MDM  EKG:EKG Interpretation Date/Time:  Sunday March 11 2023 19:21:48 EDT Ventricular Rate:  96 PR Interval:  140 QRS Duration:  87 QT Interval:  353 QTC Calculation: 447 R Axis:   77  Text Interpretation: Sinus rhythm Normal ECG Confirmed by Geoffery Lyons ([id number]) on 03/12/2023 10:08:02 PM  I have reviewed the labs performed to date as well as medications administered while in observation.  Recent changes in the last 24 hours include patient psych cleared for discharge. Mom to pick him up at 11 AM today. .  Plan  Current plan is for d/c when Mom arrives. Will d/c IVC.     Maia Plan, MD 03/20/23 1014

## 2023-03-21 ENCOUNTER — Encounter: Payer: Self-pay | Admitting: Internal Medicine

## 2023-03-29 ENCOUNTER — Ambulatory Visit: Payer: PRIVATE HEALTH INSURANCE | Admitting: Internal Medicine

## 2023-04-09 ENCOUNTER — Ambulatory Visit: Payer: No Typology Code available for payment source | Admitting: Orthopedic Surgery

## 2023-04-09 ENCOUNTER — Other Ambulatory Visit: Payer: Self-pay

## 2023-04-09 ENCOUNTER — Other Ambulatory Visit (INDEPENDENT_AMBULATORY_CARE_PROVIDER_SITE_OTHER): Payer: No Typology Code available for payment source

## 2023-04-09 DIAGNOSIS — S52022A Displaced fracture of olecranon process without intraarticular extension of left ulna, initial encounter for closed fracture: Secondary | ICD-10-CM | POA: Diagnosis not present

## 2023-04-09 DIAGNOSIS — M545 Low back pain, unspecified: Secondary | ICD-10-CM

## 2023-04-09 DIAGNOSIS — M25522 Pain in left elbow: Secondary | ICD-10-CM

## 2023-04-09 DIAGNOSIS — S32000A Wedge compression fracture of unspecified lumbar vertebra, initial encounter for closed fracture: Secondary | ICD-10-CM

## 2023-04-10 ENCOUNTER — Encounter: Payer: Self-pay | Admitting: Orthopedic Surgery

## 2023-04-10 NOTE — Progress Notes (Signed)
Office Visit Note   Patient: Dominic Bryant           Date of Birth: 11-Nov-1995           MRN: [id number] Visit Date: 04/09/2023              Requested by: Etta Grandchild, MD [address] [place],  [place] [address] PCP: Etta Grandchild, MD  Chief Complaint  Patient presents with   Lower Back - Pain      HPI: Patient is a 27 year old gentleman who is seen for initial evaluation for a closed left olecranon fracture and a compression L2 lumbar fracture.  Patient states that he jumped off a bridge about 15 to 20 feet on September 15.  Patient states he was in the emergency room for 9 days awaiting bed and placement.  Patient inquires of whether he needs to be wearing his back brace he is currently not wearing it.  Patient denies any weakness in either lower extremity.  His left arm is in a forearm cast.  Patient states he was initially scheduled for follow-up with [facility] and with Dr. Hulda Humphrey.  Assessment & Plan: Visit Diagnoses:  1. Pain in left elbow   2. Acute midline low back pain without sciatica   3. Closed compression fracture of lumbosacral spine, initial encounter (HCC)   4. Closed fracture of olecranon process of left ulna, initial encounter     Plan: Patient does not need the lumbar brace at this time.  He will follow-up with Dr. Steward Drone for his olecranon fracture.  Follow-Up Instructions: No follow-ups on file.   Ortho Exam  Patient is alert, oriented, no adenopathy, well-dressed, normal affect, normal respiratory effort. Examination patient's hand is neurovascularly intact the cast is intact on the left arm.  Patient has a normal gait with no focal motor weakness in either lower extremity.  Lumbar spine radiograph shows no progression of the superior endplate compression fracture of L2.  Imaging: XR Lumbar Spine 2-3 Views  Result Date: 04/10/2023 2 view radiographs of the lumbar spine shows a superior endplate compression fracture of L2 that is  unchanged.  XR Elbow 2 Views Left  Result Date: 04/10/2023 2 view radiographs of the left elbow shows a stable aligned olecranon fracture without change in alignment.  No images are attached to the encounter.  Labs: Lab Results  Component Value Date   HGBA1C 4.6 07/01/2020     Lab Results  Component Value Date   ALBUMIN 3.3 (L) 03/11/2023   ALBUMIN 4.0 10/09/2022   ALBUMIN 3.7 09/28/2020    Lab Results  Component Value Date   MG 1.7 03/11/2023   No results found for: "VD25OH"  No results found for: "PREALBUMIN"    Latest Ref Rng & Units 03/11/2023    6:52 PM 03/11/2023    6:50 PM 10/09/2022    9:46 AM  CBC EXTENDED  WBC 4.0 - 10.5 K/uL  12.9  5.3   RBC 4.22 - 5.81 MIL/uL  5.44  5.65   Hemoglobin 13.0 - 17.0 g/dL 19.1  47.8  29.5   HCT 39.0 - 52.0 % 51.0  49.8  50.9   Platelets 150 - 400 K/uL  308  189.0   NEUT# 1.4 - 7.7 K/uL   3.2   Lymph# 0.7 - 4.0 K/uL   1.5      There is no height or weight on file to calculate BMI.  Orders:  Orders Placed This Encounter  Procedures   XR Elbow 2 Views Left   XR Lumbar Spine 2-3 Views   No orders of the defined types were placed in this encounter.    Procedures: No procedures performed  Clinical Data: No additional findings.  ROS:  All other systems negative, except as noted in the HPI. Review of Systems  Objective: Vital Signs: There were no vitals taken for this visit.  Specialty Comments:  No specialty comments available.  PMFS History: Patient Active Problem List   Diagnosis Date Noted   Suicide attempt (HCC) 03/12/2023   Class 2 severe obesity due to excess calories with serious comorbidity and body mass index (BMI) of 36.0 to 36.9 in adult [facility]) 10/09/2022   MDD (major depressive disorder), recurrent episode, severe (HCC) 09/28/2020   Encounter for general adult medical examination with abnormal findings 07/01/2020   Major depressive disorder with single episode, in partial remission (HCC)  07/01/2020   Acquired hypothyroidism 07/01/2020   Past Medical History:  Diagnosis Date   Depression    Heart murmur    MDD (major depressive disorder)    Suicide attempt (HCC) 03/11/2023   Jumped off a bridge   Thyroid disease     Family History  Problem Relation Age of Onset   Crohn's disease Mother    Asthma Father    Alcohol abuse Father     Past Surgical History:  Procedure Laterality Date   ESOPHAGOGASTRODUODENOSCOPY (EGD) WITH PROPOFOL N/A 05/12/2015   Procedure: ESOPHAGOGASTRODUODENOSCOPY (EGD) WITH PROPOFOL;  Surgeon: Willis Modena, MD;  Location: [facility];  Service: Endoscopy;  Laterality: N/A;   EUS N/A 05/12/2015   Procedure: UPPER ENDOSCOPIC ULTRASOUND (EUS) RADIAL;  Surgeon: Willis Modena, MD;  Location: [facility];  Service: Endoscopy;  Laterality: N/A;   Social History   Occupational History   Not on file  Tobacco Use   Smoking status: Every Day    Current packs/day: 1.00    Types: Cigarettes   Smokeless tobacco: Never  Vaping Use   Vaping status: Never Used  Substance and Sexual Activity   Alcohol use: Yes    Comment: social   Drug use: Yes    Types: Marijuana   Sexual activity: Yes    Partners: Female    Birth control/protection: Condom

## 2023-04-18 ENCOUNTER — Ambulatory Visit (HOSPITAL_BASED_OUTPATIENT_CLINIC_OR_DEPARTMENT_OTHER)
Admission: RE | Admit: 2023-04-18 | Discharge: 2023-04-18 | Disposition: A | Discharge status: Home / Self Care | Payer: No Typology Code available for payment source | Source: Ambulatory Visit | Attending: Orthopaedic Surgery | Admitting: Orthopaedic Surgery

## 2023-04-18 ENCOUNTER — Ambulatory Visit (HOSPITAL_BASED_OUTPATIENT_CLINIC_OR_DEPARTMENT_OTHER): Payer: No Typology Code available for payment source | Admitting: Orthopaedic Surgery

## 2023-04-18 DIAGNOSIS — M25522 Pain in left elbow: Secondary | ICD-10-CM

## 2023-04-18 DIAGNOSIS — S52022A Displaced fracture of olecranon process without intraarticular extension of left ulna, initial encounter for closed fracture: Secondary | ICD-10-CM

## 2023-04-18 NOTE — Progress Notes (Signed)
Chief Complaint: Left olecranon fracture     History of Present Illness:    Dominic Bryant is a 27 y.o. male presents today now 6 weeks out from a left olecranon fracture after he had jumped off a bridge and in a suicide attempt.  He was initially seen in the hospital for several days and placed in a splint.  He had initially had follow-up with Dr. Hulda Humphrey but has subsequently seen Dr. Lajoyce Corners who had placed him into a long-arm cast.  He is here today for further discussion and management.  He does have a upcoming appointment scheduled with his primary care doctor    Surgical History:   None  PMH/PSH/Family History/Social History/Meds/Allergies:    Past Medical History:  Diagnosis Date   Depression    Heart murmur    MDD (major depressive disorder)    Suicide attempt (HCC) 03/11/2023   Jumped off a bridge   Thyroid disease    Past Surgical History:  Procedure Laterality Date   ESOPHAGOGASTRODUODENOSCOPY (EGD) WITH PROPOFOL N/A 05/12/2015   Procedure: ESOPHAGOGASTRODUODENOSCOPY (EGD) WITH PROPOFOL;  Surgeon: Willis Modena, MD;  [facility];  Service: Endoscopy;  Laterality: N/A;   EUS N/A 05/12/2015   Procedure: UPPER ENDOSCOPIC ULTRASOUND (EUS) RADIAL;  Surgeon: Willis Modena, MD;  [facility];  Service: Endoscopy;  Laterality: N/A;   Social History   Socioeconomic History   Marital status: Single    Spouse name: Not on file   Number of children: Not on file   Years of education: Not on file   Highest education level: Associate degree: occupational, Scientist, product/process development, or vocational program  Occupational History   Not on file  Tobacco Use   Smoking status: Every Day    Current packs/day: 1.00    Types: Cigarettes   Smokeless tobacco: Never  Vaping Use   Vaping status: Never Used  Substance and Sexual Activity   Alcohol use: Yes    Comment: social   Drug use: Yes    Types: Marijuana   Sexual activity: Yes    Partners:  Female    Birth control/protection: Condom  Other Topics Concern   Not on file  Social History Narrative   Not on file   Social Determinants of Health   Financial Resource Strain: Low Risk  (04/18/2023)   Overall Financial Resource Strain (CARDIA)    Difficulty of Paying Living Expenses: Not very hard  Food Insecurity: Food Insecurity Present (04/18/2023)   Hunger Vital Sign    Worried About Running Out of Food in the Last Year: Never true    Ran Out of Food in the Last Year: Sometimes true  Transportation Needs: No Transportation Needs (04/18/2023)   PRAPARE - Administrator, Civil Service (Medical): No    Lack of Transportation (Non-Medical): No  Physical Activity: Insufficiently Active (04/18/2023)   Exercise Vital Sign    Days of Exercise per Week: 7 days    Minutes of Exercise per Session: 10 min  Stress: Stress Concern Present (04/18/2023)   [employer] of Occupational Health - Occupational Stress Questionnaire    Feeling of Stress : Very much  Social Connections: Socially Isolated (04/18/2023)   Social Connection and Isolation Panel [NHANES]    Frequency of Communication with Friends and Family: Once a week  Frequency of Social Gatherings with Friends and Family: Once a week    Attends Religious Services: Never    Database administrator or Organizations: No    Attends Engineer, structural: Not on file    Marital Status: Never married   Family History  Problem Relation Age of Onset   Crohn's disease Mother    Asthma Father    Alcohol abuse Father    No Known Allergies Current Outpatient Medications  Medication Sig Dispense Refill   QUEtiapine (SEROQUEL XR) 150 MG 24 hr tablet Take 1 tablet (150 mg total) by mouth at bedtime. 30 tablet 0   sertraline (ZOLOFT) 100 MG tablet Take 2 tablets (200 mg total) by mouth daily. 60 tablet 0   No current facility-administered medications for this visit.   No results found.  Review of Systems:    A ROS was performed including pertinent positives and negatives as documented in the HPI.  Physical Exam :   Constitutional: NAD and appears stated age Neurological: Alert and oriented Psych: Appropriate affect and cooperative There were no vitals taken for this visit.   Comprehensive Musculoskeletal Exam:    Left arm cast is removed.  He does have active elbow flexion to approximately 90 degrees and active elbow extension 20 degrees short of full.  Otherwise swelling about the elbow.  Distal neurosensory exam is intact  Imaging:   Xray (3 views left elbow): Casted olecranon fracture with some interval callus   I personally reviewed and interpreted the radiographs.   Assessment:   27 y.o. male presents 6 weeks out from a left olecranon fracture currently in a long-arm cast.  Today's visit he does have evidence of callus on x-ray.  Given this we did plan to remove his cast.  He will begin active flexion and extension.  I would like him to begin working on his elbow range of motion.  I will plan to see him back in 1 month for recheck of his motion  Plan :    -Return to clinic 1 month     I personally saw and evaluated the patient, and participated in the management and treatment plan.  Huel Cote, MD Attending Physician, Orthopedic Surgery  This document was dictated using Dragon voice recognition software. A reasonable attempt at proof reading has been made to minimize errors.

## 2023-04-19 ENCOUNTER — Telehealth: Payer: Self-pay | Admitting: Internal Medicine

## 2023-04-19 ENCOUNTER — Ambulatory Visit: Payer: No Typology Code available for payment source | Admitting: Internal Medicine

## 2023-04-19 NOTE — Telephone Encounter (Signed)
Pt scheduled via portal for new pt visit with Rodman Pickle, DNP at [facility] noting looking for new provider. Please advise if ok for TOC.

## 2023-04-23 NOTE — Therapy (Signed)
OUTPATIENT PHYSICAL THERAPY EVALUATION   Patient Name: Dominic Bryant MRN: [id number] DOB:1995-10-03, 27 y.o., male Today's Date: 04/24/2023  END OF SESSION:  PT End of Session - 04/24/23 1013     Visit Number 1    Number of Visits 17    Date for PT Re-Evaluation 06/19/23    Authorization Type [email or web address]    Authorization Time Period 27VL before auth per appt notes    PT Start Time 1015    PT Stop Time 1056    PT Time Calculation (min) 41 min    Activity Tolerance Patient tolerated treatment well    Behavior During Therapy [facility] for tasks assessed/performed             Past Medical History:  Diagnosis Date   Depression    Heart murmur    MDD (major depressive disorder)    Suicide attempt (HCC) 03/11/2023   Jumped off a bridge   Thyroid disease    Past Surgical History:  Procedure Laterality Date   ESOPHAGOGASTRODUODENOSCOPY (EGD) WITH PROPOFOL N/A 05/12/2015   Procedure: ESOPHAGOGASTRODUODENOSCOPY (EGD) WITH PROPOFOL;  Surgeon: Willis Modena, MD;  Location: [facility];  Service: Endoscopy;  Laterality: N/A;   EUS N/A 05/12/2015   Procedure: UPPER ENDOSCOPIC ULTRASOUND (EUS) RADIAL;  Surgeon: Willis Modena, MD;  Location: [facility];  Service: Endoscopy;  Laterality: N/A;   Patient Active Problem List   Diagnosis Date Noted   Suicide attempt (HCC) 03/12/2023   Class 2 severe obesity due to excess calories with serious comorbidity and body mass index (BMI) of 36.0 to 36.9 in adult [facility]) 10/09/2022   MDD (major depressive disorder), recurrent episode, severe (HCC) 09/28/2020   Encounter for general adult medical examination with abnormal findings 07/01/2020   Major depressive disorder with single episode, in partial remission (HCC) 07/01/2020   Acquired hypothyroidism 07/01/2020    PCP: Etta Grandchild, MD  REFERRING PROVIDER: Huel Cote, MD  REFERRING DIAG: S52.022A (ICD-10-CM) - Closed fracture of olecranon process of left ulna, initial  encounter Referral comment: "ROM and strengthening as tolerated L elbow"  THERAPY DIAG:  Pain in left elbow  Pain in left wrist  Stiffness of left elbow, not elsewhere classified  Muscle weakness (generalized)  Rationale for Evaluation and Treatment: Rehabilitation  ONSET DATE: 03/11/23  SUBJECTIVE:                                                                                                                                                                                      SUBJECTIVE STATEMENT: Pt endorses fracture sustained in September 2024, was in a cast up until last week. He states that since removing cast he has  had difficulty with straightening and bending arm, as well as lifting items. Also endorses weakness in L arm, mild wrist pain at times. He denies any N/T, endorses stiffness as primary issue.  He also reports having a compression fracture in low back - states he was initially wearing a brace but has since been cleared from that. Unsure if he still has restrictions but does endorse some hip/low back stiffness and pain at times, particularly with bed mobility.  Hand dominance: Right  PERTINENT HISTORY: PMH: depression, suicide attempt 03/11/23  PAIN:  Are you having pain: yes in hips and tailbone, none in elbow Location/description: L elbow Best-worst over past week: 0-6/10  - aggravating factors: putting on socks, straightening arm, picking up objects (coffee cup, skillet) - Easing factors: cessation of provocative activity    PRECAUTIONS: L elbow fracture (ROM/strengthening per referral), lumbar compression fracture   WEIGHT BEARING RESTRICTIONS: No  FALLS:  Has patient fallen in last 6 months? No  LIVING ENVIRONMENT: Lives in 1 level home + basement; limited stair navigation in home Lives with mother, her husband, and his sister Housework split between everyone, pt does a bit of cleaning and housework    OCCUPATION: Not working currently - enjoys  movies, being on phone  PLOF: Independent  PATIENT GOALS: be able to pick stuff up more  NEXT MD VISIT: Nov 20th Dr. Steward Drone  OBJECTIVE:  Note: Objective measures were completed at Evaluation unless otherwise noted.  DIAGNOSTIC FINDINGS:  See EPIC for details  PATIENT SURVEYS:  FOTO 51 current, 57 predicted  COGNITION: Overall cognitive status: Within functional limits for tasks assessed     SENSATION: Denies sensory complaints   UPPER EXTREMITY ROM:   ROM Right eval Left eval  Shoulder flexion    Shoulder extension    Shoulder abduction    Shoulder adduction    Shoulder internal rotation    Shoulder external rotation    Elbow flexion 130 deg 125 deg *  Elbow extension Lacking 20 deg, painless Lacking 56 deg *  Wrist flexion 80 58 *  Wrist extension    Wrist ulnar deviation symmetrical Symmetrical *  Wrist radial deviation Symmetrical  Symmetrical *  Wrist pronation 82 deg 80 deg  Wrist supination 85 deg 85 deg *  (Blank rows = not tested) (Key: WFL = within functional limits not formally assessed, * = concordant pain, s = stiffness/stretching sensation, NT = not tested)  Comment:   UPPER EXTREMITY MMT:  MMT Right eval Left eval  Shoulder flexion    Shoulder extension    Shoulder abduction    Shoulder adduction    Shoulder internal rotation    Shoulder external rotation    Middle trapezius    Lower trapezius    Elbow flexion    Elbow extension    Wrist flexion    Wrist extension    Wrist ulnar deviation    Wrist radial deviation    Wrist pronation    Wrist supination    Grip strength (lbs) 55# 15#  (Blank rows = not tested) (Key: WFL = within functional limits not formally assessed, * = concordant pain, s = stiffness/stretching sensation, NT = not tested)  Comment:     TODAY'S TREATMENT:  [facility] Adult PT  Treatment:                                                DATE: 04/24/23 Therapeutic Exercise: Elbow flex/ext AAROM x8 Towel gripping x8 Wrist flex/ext AROM x8 HEP handout + education, education on monitoring and appropriate performance   PATIENT EDUCATION: Education details: Pt education on PT impairments, prognosis, and POC. Informed consent. Rationale for interventions, safe/appropriate HEP performance Person educated: Patient Education method: Explanation, Demonstration, Tactile cues, Verbal cues, and Handouts Education comprehension: verbalized understanding, returned demonstration, verbal cues required, tactile cues required, and needs further education    HOME EXERCISE PROGRAM: Access Code: [email or web address] URL: [email or web address] Date: 04/24/2023 Prepared by: Fransisco Hertz  Exercises - Seated Elbow Flexion AAROM  - 2-3 x daily - 1 sets - 8 reps - Seated Gripping Towel  - 2-3 x daily - 1 sets - 8 reps - Wrist Flexion Extension AROM with Fingers Curled and Palm Down  - 2-3 x daily - 1 sets - 8 reps  ASSESSMENT:  CLINICAL IMPRESSION: Patient is a 27 y.o. gentleman who was seen today for physical therapy evaluation and treatment for elbow pain s/p fracture September 2024. He notes stiffness and weakness as primary limiting factors for household/daily tasks. On exam he demonstrates mobility limitations at elbow and wrist, as well as reduced grip strength. Tolerates exam and HEP well without any increase in resting pain, no adverse events. Recommend skilled PT to address aforementioned deficits with aim of improving functional tolerance and reducing pain with typical activities. Pt departs today's session in no acute distress, all voiced concerns/questions addressed appropriately from PT perspective.      OBJECTIVE IMPAIRMENTS: decreased activity tolerance, decreased endurance, decreased mobility, difficulty walking, decreased ROM, decreased strength, impaired UE functional  use, and pain.   ACTIVITY LIMITATIONS: carrying, lifting, and dressing  PARTICIPATION LIMITATIONS: meal prep, cleaning, laundry, and community activity  PERSONAL FACTORS: Time since onset of injury/illness/exacerbation and 1 comorbidity: depression  are also affecting patient's functional outcome.   REHAB POTENTIAL: Good  CLINICAL DECISION MAKING: Stable/uncomplicated  EVALUATION COMPLEXITY: Low   GOALS: Goals reviewed with patient? No  SHORT TERM GOALS: Target date: 05/22/2023 Pt will demonstrate appropriate understanding and performance of initially prescribed HEP in order to facilitate improved independence with management of symptoms.  Baseline: HEP provided on eval Goal status: INITIAL   2. Pt will score greater than or equal to 60 on FOTO in order to demonstrate improved perception of function due to symptoms.  Baseline: 51  Goal status: INITIAL    LONG TERM GOALS: Target date: 06/19/2023 Pt will score 68 on FOTO in order to demonstrate improved perception of function due to symptoms. Baseline: 51 Goal status: INITIAL  2.  Pt will demonstrate at least 10-130 degrees of active elbow ROM in order to demonstrate improved tolerance to daily tasks. Baseline: see ROM chart above Goal status: INITIAL  3. Pt will demonstrate LUE grip strength within at least 10# of contralateral limb in order to facilitate improved tolerance to functional tasks.   Baseline: see ROM chart above  Goal status: INITIAL  4. Pt will be able to lift up to 8# with LUE with less than 3 pt increase in pain in order to facilitate improved tolerance to household tasks such as cooking and cleaning.  Baseline: NT, pt endorses difficulty lifting coffee  cups and skillets  Goal status: INITIAL    PLAN:  PT FREQUENCY: 2x/week  PT DURATION: 8 weeks  PLANNED INTERVENTIONS: 97164- PT Re-evaluation, 97110-Therapeutic exercises, 97530- Therapeutic activity, 97112- Neuromuscular re-education, 97535- Self  Care, 97140- Manual therapy, [phone]- Gait training, Patient/Family education, Balance training, Stair training, Taping, Dry Needling, Joint mobilization, Cryotherapy, and Moist heat  PLAN FOR NEXT SESSION: Review/update HEP PRN. Work on [employer] exercises as appropriate with emphasis on elbow/wrist mobility. Symptom modification strategies as indicated/appropriate.    Ashley Murrain PT, DPT 04/24/2023 12:19 PM

## 2023-04-24 ENCOUNTER — Encounter: Payer: Self-pay | Admitting: Physical Therapy

## 2023-04-24 ENCOUNTER — Other Ambulatory Visit: Payer: Self-pay

## 2023-04-24 ENCOUNTER — Ambulatory Visit: Payer: No Typology Code available for payment source | Attending: Orthopaedic Surgery | Admitting: Physical Therapy

## 2023-04-24 DIAGNOSIS — M25522 Pain in left elbow: Secondary | ICD-10-CM | POA: Insufficient documentation

## 2023-04-24 DIAGNOSIS — M25622 Stiffness of left elbow, not elsewhere classified: Secondary | ICD-10-CM | POA: Diagnosis present

## 2023-04-24 DIAGNOSIS — M6281 Muscle weakness (generalized): Secondary | ICD-10-CM | POA: Diagnosis present

## 2023-04-24 DIAGNOSIS — S52022A Displaced fracture of olecranon process without intraarticular extension of left ulna, initial encounter for closed fracture: Secondary | ICD-10-CM | POA: Diagnosis not present

## 2023-04-24 DIAGNOSIS — M25532 Pain in left wrist: Secondary | ICD-10-CM | POA: Insufficient documentation

## 2023-04-25 ENCOUNTER — Telehealth: Payer: Self-pay

## 2023-04-25 ENCOUNTER — Ambulatory Visit: Payer: No Typology Code available for payment source | Admitting: Internal Medicine

## 2023-04-25 NOTE — Telephone Encounter (Signed)
Transition Care Management Unsuccessful Follow-up Telephone Call  Date of discharge and from where:  [facility] 9/24  Attempts:  1st Attempt  Reason for unsuccessful TCM follow-up call:  No answer/busy   Lenard Forth [place]  [facility], [facility] Guide, Phone: [phone] Website: [email or web address]

## 2023-04-25 NOTE — Telephone Encounter (Signed)
Transition Care Management Unsuccessful Follow-up Telephone Call  Date of discharge and from where:  [facility]/24  Attempts:  2nd Attempt  Reason for unsuccessful TCM follow-up call:  No answer/busy   Lenard Forth [place]  [facility], [facility] Guide, Phone: [phone] Website: [email or web address]

## 2023-04-30 NOTE — Therapy (Signed)
OUTPATIENT PHYSICAL THERAPY TREATMENT NOTE   Patient Name: Dominic Bryant MRN: [id number] DOB:12-25-1995, 27 y.o., male Today's Date: 05/01/2023  END OF SESSION:  PT End of Session - 05/01/23 1016     Visit Number 2    Number of Visits 17    Date for PT Re-Evaluation 06/19/23    Authorization Type [email or web address]    Authorization Time Period 27VL before auth per appt notes    PT Start Time 1016    PT Stop Time 1057    PT Time Calculation (min) 41 min    Activity Tolerance Patient tolerated treatment well    Behavior During Therapy [facility] for tasks assessed/performed              Past Medical History:  Diagnosis Date   Depression    Heart murmur    MDD (major depressive disorder)    Suicide attempt (HCC) 03/11/2023   Jumped off a bridge   Thyroid disease    Past Surgical History:  Procedure Laterality Date   ESOPHAGOGASTRODUODENOSCOPY (EGD) WITH PROPOFOL N/A 05/12/2015   Procedure: ESOPHAGOGASTRODUODENOSCOPY (EGD) WITH PROPOFOL;  Surgeon: Willis Modena, MD;  Location: [facility];  Service: Endoscopy;  Laterality: N/A;   EUS N/A 05/12/2015   Procedure: UPPER ENDOSCOPIC ULTRASOUND (EUS) RADIAL;  Surgeon: Willis Modena, MD;  Location: [facility];  Service: Endoscopy;  Laterality: N/A;   Patient Active Problem List   Diagnosis Date Noted   Suicide attempt (HCC) 03/12/2023   Class 2 severe obesity due to excess calories with serious comorbidity and body mass index (BMI) of 36.0 to 36.9 in adult [facility]) 10/09/2022   MDD (major depressive disorder), recurrent episode, severe (HCC) 09/28/2020   Encounter for general adult medical examination with abnormal findings 07/01/2020   Major depressive disorder with single episode, in partial remission (HCC) 07/01/2020   Acquired hypothyroidism 07/01/2020    PCP: Etta Grandchild, MD  REFERRING PROVIDER: Huel Cote, MD  REFERRING DIAG: S52.022A (ICD-10-CM) - Closed fracture of olecranon process of left ulna, initial  encounter Referral comment: "ROM and strengthening as tolerated L elbow"  THERAPY DIAG:  Pain in left elbow  Pain in left wrist  Stiffness of left elbow, not elsewhere classified  Muscle weakness (generalized)  Rationale for Evaluation and Treatment: Rehabilitation  ONSET DATE: 03/11/23  SUBJECTIVE:                                                                                                                                                                                     Per eval - Pt endorses fracture sustained in September 2024, was in a cast up until last week. He states that since removing cast  he has had difficulty with straightening and bending arm, as well as lifting items. Also endorses weakness in L arm, mild wrist pain at times. He denies any N/T, endorses stiffness as primary issue.  He also reports having a compression fracture in low back - states he was initially wearing a brace but has since been cleared from that. Unsure if he still has restrictions but does endorse some hip/low back stiffness and pain at times, particularly with bed mobility.  Hand dominance: Right  SUBJECTIVE STATEMENT: 05/01/2023 no elbow pain at present, does report some mild low back pain. Does endorse some pain and popping in his elbow yesterday with more activity, was folding laundry and doing dishes, walking his dog. Has been doing HEP, pain with extension.    PERTINENT HISTORY: PMH: depression, suicide attempt 03/11/23  PAIN:  Are you having pain: yes in hips and tailbone, none in elbow Location/description: L elbow Best-worst over past week: 0-6/10  - aggravating factors: putting on socks, straightening arm, picking up objects (coffee cup, skillet) - Easing factors: cessation of provocative activity    PRECAUTIONS: L elbow fracture (ROM/strengthening per referral), lumbar compression fracture   WEIGHT BEARING RESTRICTIONS: No  FALLS:  Has patient fallen in last 6 months?  No  LIVING ENVIRONMENT: Lives in 1 level home + basement; limited stair navigation in home Lives with mother, her husband, and his sister Housework split between everyone, pt does a bit of cleaning and housework    OCCUPATION: Not working currently - enjoys movies, being on phone  PLOF: Independent  PATIENT GOALS: be able to pick stuff up more  NEXT MD VISIT: Nov 20th Dr. Steward Drone, PCP 11/7  OBJECTIVE:  Note: Objective measures were completed at Evaluation unless otherwise noted.  DIAGNOSTIC FINDINGS:  See EPIC for details  PATIENT SURVEYS:  FOTO 51 current, 48 predicted  COGNITION: Overall cognitive status: Within functional limits for tasks assessed     SENSATION: Denies sensory complaints   UPPER EXTREMITY ROM:   ROM Right eval Left eval Left 05/01/23  Shoulder flexion     Shoulder extension     Shoulder abduction     Shoulder adduction     Shoulder internal rotation     Shoulder external rotation     Elbow flexion 130 deg 125 deg *   Elbow extension Lacking 20 deg, painless Lacking 56 deg * Lacking 30 deg *  Wrist flexion 80 58 *   Wrist extension     Wrist ulnar deviation symmetrical Symmetrical *   Wrist radial deviation Symmetrical  Symmetrical *   Wrist pronation 82 deg 80 deg   Wrist supination 85 deg 85 deg *   (Blank rows = not tested) (Key: WFL = within functional limits not formally assessed, * = concordant pain, s = stiffness/stretching sensation, NT = not tested)  Comment:   UPPER EXTREMITY MMT:  MMT Right eval Left eval  Shoulder flexion    Shoulder extension    Shoulder abduction    Shoulder adduction    Shoulder internal rotation    Shoulder external rotation    Middle trapezius    Lower trapezius    Elbow flexion    Elbow extension    Wrist flexion    Wrist extension    Wrist ulnar deviation    Wrist radial deviation    Wrist pronation    Wrist supination    Grip strength (lbs) 55# 15#  (Blank rows = not tested) (Key: WFL  = within  functional limits not formally assessed, * = concordant pain, s = stiffness/stretching sensation, NT = not tested)  Comment:     TODAY'S TREATMENT:                                                                                                                                         [facility] Adult PT Treatment:                                                DATE: 05/01/23 Therapeutic Exercise: Elbow flex/ext AAROM 3x8 LUE (first set supinated, second neutral, third pronated) cues for comfortable ROM Wrist flex/ext 2x10 AROM LUE Towel gripping LUE x15  Supination/pronation AROM w/ towel grip 3x5 Shoulder flexion AROM x12  Supported radial deviation AROM 2x8 Scapular retraction x15 cues for form Verbal HEP review    [facility] Adult PT Treatment:                                                DATE: 04/24/23 Therapeutic Exercise: Elbow flex/ext AAROM x8 Towel gripping x8 Wrist flex/ext AROM x8 HEP handout + education, education on monitoring and appropriate performance   PATIENT EDUCATION: Education details: rationale for interventions, HEP  Person educated: Patient Education method: Explanation, Demonstration, Tactile cues, Verbal cues Education comprehension: verbalized understanding, returned demonstration, verbal cues required, tactile cues required, and needs further education    HOME EXERCISE PROGRAM: Access Code: [email or web address] URL: [email or web address] Date: 04/24/2023 Prepared by: Fransisco Hertz  Exercises - Seated Elbow Flexion AAROM  - 2-3 x daily - 1 sets - 8 reps - Seated Gripping Towel  - 2-3 x daily - 1 sets - 8 reps - Wrist Flexion Extension AROM with Fingers Curled and Palm Down  - 2-3 x daily - 1 sets - 8 reps  ASSESSMENT:  CLINICAL IMPRESSION: 05/01/2023 Pt arrives w/o elbow pain, no issues with HEP. Today focusing on improving elbow/wrist mobility in various positions, pt primarily limited by stiffness. Does demonstrate fairly significant  improvement in elbow extension ROM compared to initial eval although remains limited. No adverse events, no increase in pain on departure and endorses some improvement in stiffness. Recommend continuing along current POC in order to address relevant deficits and improve functional tolerance. Pt departs today's session in no acute distress, all voiced questions/concerns addressed appropriately from PT perspective.    Per eval - Patient is a 27 y.o. gentleman who was seen today for physical therapy evaluation and treatment for elbow pain s/p fracture September 2024. He notes stiffness and weakness as primary limiting factors for household/daily tasks. On exam he demonstrates mobility limitations at elbow and wrist, as  well as reduced grip strength. Tolerates exam and HEP well without any increase in resting pain, no adverse events. Recommend skilled PT to address aforementioned deficits with aim of improving functional tolerance and reducing pain with typical activities. Pt departs today's session in no acute distress, all voiced concerns/questions addressed appropriately from PT perspective.      OBJECTIVE IMPAIRMENTS: decreased activity tolerance, decreased endurance, decreased mobility, difficulty walking, decreased ROM, decreased strength, impaired UE functional use, and pain.   ACTIVITY LIMITATIONS: carrying, lifting, and dressing  PARTICIPATION LIMITATIONS: meal prep, cleaning, laundry, and community activity  PERSONAL FACTORS: Time since onset of injury/illness/exacerbation and 1 comorbidity: depression  are also affecting patient's functional outcome.   REHAB POTENTIAL: Good  CLINICAL DECISION MAKING: Stable/uncomplicated  EVALUATION COMPLEXITY: Low   GOALS: Goals reviewed with patient? No  SHORT TERM GOALS: Target date: 05/22/2023 Pt will demonstrate appropriate understanding and performance of initially prescribed HEP in order to facilitate improved independence with management of  symptoms.  Baseline: HEP provided on eval Goal status: INITIAL   2. Pt will score greater than or equal to 60 on FOTO in order to demonstrate improved perception of function due to symptoms.  Baseline: 51  Goal status: INITIAL    LONG TERM GOALS: Target date: 06/19/2023 Pt will score 68 on FOTO in order to demonstrate improved perception of function due to symptoms. Baseline: 51 Goal status: INITIAL  2.  Pt will demonstrate at least 10-130 degrees of active elbow ROM in order to demonstrate improved tolerance to daily tasks. Baseline: see ROM chart above Goal status: INITIAL  3. Pt will demonstrate LUE grip strength within at least 10# of contralateral limb in order to facilitate improved tolerance to functional tasks.   Baseline: see ROM chart above  Goal status: INITIAL  4. Pt will be able to lift up to 8# with LUE with less than 3 pt increase in pain in order to facilitate improved tolerance to household tasks such as cooking and cleaning.  Baseline: NT, pt endorses difficulty lifting coffee cups and skillets  Goal status: INITIAL    PLAN:  PT FREQUENCY: 2x/week  PT DURATION: 8 weeks  PLANNED INTERVENTIONS: 97164- PT Re-evaluation, 97110-Therapeutic exercises, 97530- Therapeutic activity, 97112- Neuromuscular re-education, 97535- Self Care, 16109- Manual therapy, [phone]- Gait training, Patient/Family education, Balance training, Stair training, Taping, Dry Needling, Joint mobilization, Cryotherapy, and Moist heat  PLAN FOR NEXT SESSION: Review/update HEP PRN. Work on [employer] exercises as appropriate with emphasis on elbow/wrist mobility. Symptom modification strategies as indicated/appropriate.     Ashley Murrain PT, DPT 05/01/2023 11:00 AM

## 2023-05-01 ENCOUNTER — Encounter: Payer: Self-pay | Admitting: Physical Therapy

## 2023-05-01 ENCOUNTER — Ambulatory Visit: Payer: No Typology Code available for payment source | Attending: Orthopaedic Surgery | Admitting: Physical Therapy

## 2023-05-01 DIAGNOSIS — M25622 Stiffness of left elbow, not elsewhere classified: Secondary | ICD-10-CM | POA: Insufficient documentation

## 2023-05-01 DIAGNOSIS — M6281 Muscle weakness (generalized): Secondary | ICD-10-CM | POA: Diagnosis present

## 2023-05-01 DIAGNOSIS — M25522 Pain in left elbow: Secondary | ICD-10-CM | POA: Insufficient documentation

## 2023-05-01 DIAGNOSIS — M25532 Pain in left wrist: Secondary | ICD-10-CM | POA: Diagnosis present

## 2023-05-03 ENCOUNTER — Ambulatory Visit (INDEPENDENT_AMBULATORY_CARE_PROVIDER_SITE_OTHER): Payer: No Typology Code available for payment source | Admitting: Nurse Practitioner

## 2023-05-03 ENCOUNTER — Encounter: Payer: Self-pay | Admitting: Physical Therapy

## 2023-05-03 ENCOUNTER — Ambulatory Visit: Payer: No Typology Code available for payment source | Admitting: Physical Therapy

## 2023-05-03 ENCOUNTER — Encounter: Payer: Self-pay | Admitting: Nurse Practitioner

## 2023-05-03 VITALS — BP 104/68 | HR 69 | Temp 98.6°F | Ht 67.0 in | Wt 216.4 lb

## 2023-05-03 DIAGNOSIS — M25532 Pain in left wrist: Secondary | ICD-10-CM

## 2023-05-03 DIAGNOSIS — Z9151 Personal history of suicidal behavior: Secondary | ICD-10-CM | POA: Diagnosis not present

## 2023-05-03 DIAGNOSIS — Z8781 Personal history of (healed) traumatic fracture: Secondary | ICD-10-CM

## 2023-05-03 DIAGNOSIS — E039 Hypothyroidism, unspecified: Secondary | ICD-10-CM

## 2023-05-03 DIAGNOSIS — M545 Low back pain, unspecified: Secondary | ICD-10-CM

## 2023-05-03 DIAGNOSIS — M25522 Pain in left elbow: Secondary | ICD-10-CM | POA: Diagnosis not present

## 2023-05-03 DIAGNOSIS — F324 Major depressive disorder, single episode, in partial remission: Secondary | ICD-10-CM | POA: Diagnosis not present

## 2023-05-03 DIAGNOSIS — M6281 Muscle weakness (generalized): Secondary | ICD-10-CM

## 2023-05-03 DIAGNOSIS — M25622 Stiffness of left elbow, not elsewhere classified: Secondary | ICD-10-CM

## 2023-05-03 DIAGNOSIS — Z720 Tobacco use: Secondary | ICD-10-CM

## 2023-05-03 DIAGNOSIS — G8929 Other chronic pain: Secondary | ICD-10-CM

## 2023-05-03 MED ORDER — SERTRALINE HCL 100 MG PO TABS: 200.0000 mg | ORAL_TABLET | Freq: Every day | ORAL | 0 refills | Status: DC

## 2023-05-03 MED ORDER — CELECOXIB 200 MG PO CAPS: 200.0000 mg | ORAL_CAPSULE | Freq: Every day | ORAL | 0 refills | Status: DC

## 2023-05-03 NOTE — Assessment & Plan Note (Addendum)
He reports back pain onset following a fall in September, exacerbated by movement and standing for prolonged periods, with associated tingling and balance issues. X-rays showed compression fractures of L1-L3. He has been using Celebrex for pain relief without a prescription. We will start Celebrex 200mg  daily for pain management and refer him to spinal surgery for further evaluation and management. He states the last provider he was referred to is out of network.

## 2023-05-03 NOTE — Assessment & Plan Note (Signed)
Chronic, stable. TPO antibodies positive. Most recent TSH in normal range without medication. Will continue to monitor TSH yearly.

## 2023-05-03 NOTE — Assessment & Plan Note (Signed)
He has a history of depression since high school, currently managed with Seroquel 150mg  and Zoloft 200mg . He reports fluctuating moods, with periods of anger and sadness, and a recent suicide attempt in September. He is interested in therapy. We will continue Seroquel 150mg  and Zoloft 100mg , refer him to Psychiatry for further management, and to a Therapist for additional support. Information given on the 24 hour urgent behavioral health care if needed. He currently denies SI.

## 2023-05-03 NOTE — Patient Instructions (Signed)
It was great to see you!  I have placed a referral to psychiatry and the back doctor, they will call to schedule  Start celebrex 1 capsule daily for your pain  Keep taking the seroquel and zoloft.  Let's follow-up in 3 months, sooner if you have concerns.  If a referral was placed today, you will be contacted for an appointment. Please note that routine referrals can sometimes take up to 3-4 weeks to process. Please call our office if you haven't heard anything after this time frame.  Take care,  Rodman Pickle, NP

## 2023-05-03 NOTE — Progress Notes (Signed)
New Patient Visit  BP 104/68 (BP Location: Right Arm)   Pulse 69   Temp 98.6 F (37 C)   Ht 5\' 7"  (1.702 m)   Wt 216 lb 6.4 oz (98.2 kg)   SpO2 98%   BMI 33.89 kg/m    Subjective:    Patient ID: Dominic Bryant, male    DOB: October 09, 1995, 27 y.o.   MRN: [id number]  CC: Chief Complaint  Patient presents with   Transfer of Care    Request referrals     HPI: Dominic Bryant is a 27 y.o. male presents for new patient visit to establish care.  Introduced to Publishing rights manager role and practice setting.  All questions answered.  Discussed provider/patient relationship and expectations.  Discussed the use of AI scribe software for clinical note transcription with the patient, who gave verbal consent to proceed.  History of Present Illness   The patient, with a history of depression, thyroid issues, and a heart murmur in his youth, presents with chronic back pain following a recent suicide attempt. The patient reports a significant decrease in his quality of life due to his depression, which manifests as a mix of anger and sadness. He also reports occasional panic attacks and chronic sleep disturbances, primarily characterized by insufficient sleep. The patient's depression is currently managed with Seroquel and Zoloft, but he expresses a desire for additional mental health support, including therapy and psychiatry.  The patient's back pain, a result of a fall during a suicide attempt, is described as soreness and primarily located in his buttocks and hips. The pain intensifies with prolonged standing or walking and has led to balance issues and dizzy spells. The patient reports using Celebrex, a non-prescribed medication, to manage his pain.  The patient also reports a history of smoking marijuana and cigarettes, and social drinking. He currently smokes half a pack of cigarettes a day, having reduced from a pack a day.        05/03/2023    2:16 PM 10/09/2022    9:25 AM 07/03/2022    9:04 AM  10/13/2020   10:48 AM 09/28/2020    3:49 AM  Depression screen PHQ 2/9  Decreased Interest 3 1 0    Down, Depressed, Hopeless 3 2 0    PHQ - 2 Score 6 3 0    Altered sleeping 3 3 3     Tired, decreased energy 2 0 2    Change in appetite 1 2 1     Feeling bad or failure about yourself  3 1 0    Trouble concentrating 2 2 0    Moving slowly or fidgety/restless 2 0 0    Suicidal thoughts 3 2 0    PHQ-9 Score 22 13 6     Difficult doing work/chores Very difficult Somewhat difficult        Information is confidential and restricted. Go to Review Flowsheets to unlock data.      05/03/2023    2:16 PM  GAD 7 : Generalized Anxiety Score  Nervous, Anxious, on Edge 2  Control/stop worrying 3  Worry too much - different things 3  Trouble relaxing 3  Restless 2  Easily annoyed or irritable 1  Afraid - awful might happen 2  Total GAD 7 Score 16  Anxiety Difficulty Somewhat difficult    Past Medical History:  Diagnosis Date   Depression    Heart murmur    MDD (major depressive disorder)    Suicide attempt (HCC) 03/11/2023  Jumped off a bridge   Thyroid disease     Past Surgical History:  Procedure Laterality Date   ESOPHAGOGASTRODUODENOSCOPY (EGD) WITH PROPOFOL N/A 05/12/2015   Procedure: ESOPHAGOGASTRODUODENOSCOPY (EGD) WITH PROPOFOL;  Surgeon: Willis Modena, MD;  Location: [facility];  Service: Endoscopy;  Laterality: N/A;   EUS N/A 05/12/2015   Procedure: UPPER ENDOSCOPIC ULTRASOUND (EUS) RADIAL;  Surgeon: Willis Modena, MD;  Location: [facility];  Service: Endoscopy;  Laterality: N/A;    Family History  Problem Relation Age of Onset   Crohn's disease Mother    Asthma Father    Alcohol abuse Father      Social History   Tobacco Use   Smoking status: Every Day    Current packs/day: 0.50    Average packs/day: 0.5 packs/day for 7.8 years (3.9 ttl pk-yrs)    Types: Cigarettes    Start date: 2017   Smokeless tobacco: Never  Vaping Use   Vaping status: Never Used   Substance Use Topics   Alcohol use: Yes    Comment: social   Drug use: Yes    Types: Marijuana    Current Outpatient Medications on File Prior to Visit  Medication Sig Dispense Refill   QUEtiapine (SEROQUEL XR) 150 MG 24 hr tablet Take 1 tablet (150 mg total) by mouth at bedtime. 30 tablet 0   [DISCONTINUED] ARIPiprazole (ABILIFY) 5 MG tablet Take 5 mg by mouth daily.     [DISCONTINUED] FLUoxetine (PROZAC) 10 MG tablet Take 1 tablet (10 mg total) by mouth daily. 30 tablet 1   No current facility-administered medications on file prior to visit.     Review of Systems  Constitutional: Negative.   HENT: Negative.    Eyes: Negative.   Respiratory: Negative.    Cardiovascular: Negative.   Gastrointestinal: Negative.   Endocrine: Negative.   Genitourinary: Negative.   Musculoskeletal:  Positive for back pain.  Skin: Negative.   Neurological: Negative.   Psychiatric/Behavioral:  Positive for dysphoric mood and sleep disturbance.         Objective:    BP 104/68 (BP Location: Right Arm)   Pulse 69   Temp 98.6 F (37 C)   Ht 5\' 7"  (1.702 m)   Wt 216 lb 6.4 oz (98.2 kg)   SpO2 98%   BMI 33.89 kg/m   Wt Readings from Last 3 Encounters:  05/03/23 216 lb 6.4 oz (98.2 kg)  10/09/22 222 lb (100.7 kg)  07/03/22 231 lb (104.8 kg)    BP Readings from Last 3 Encounters:  05/03/23 104/68  10/09/22 108/62  10/05/22 112/76    Physical Exam Vitals and nursing note reviewed.  Constitutional:      General: He is not in acute distress.    Appearance: Normal appearance.  HENT:     Head: Normocephalic and atraumatic.     Right Ear: Tympanic membrane, ear canal and external ear normal.     Left Ear: Tympanic membrane, ear canal and external ear normal.  Eyes:     Conjunctiva/sclera: Conjunctivae normal.  Cardiovascular:     Rate and Rhythm: Normal rate and regular rhythm.     Pulses: Normal pulses.     Heart sounds: Normal heart sounds.  Pulmonary:     Effort: Pulmonary  effort is normal.     Breath sounds: Normal breath sounds.  Abdominal:     Palpations: Abdomen is soft.     Tenderness: There is no abdominal tenderness.  Musculoskeletal:        General:  No tenderness. Normal range of motion.     Cervical back: Normal range of motion and neck supple. No tenderness.     Right lower leg: No edema.     Left lower leg: No edema.  Lymphadenopathy:     Cervical: No cervical adenopathy.  Skin:    General: Skin is warm and dry.  Neurological:     General: No focal deficit present.     Mental Status: He is alert and oriented to person, place, and time.     Cranial Nerves: No cranial nerve deficit.     Coordination: Coordination normal.     Gait: Gait normal.  Psychiatric:        Mood and Affect: Mood normal.        Behavior: Behavior normal.        Thought Content: Thought content normal.        Judgment: Judgment normal.        Assessment & Plan:   Problem List Items Addressed This Visit       Endocrine   Acquired hypothyroidism    Chronic, stable. TPO antibodies positive. Most recent TSH in normal range without medication. Will continue to monitor TSH yearly.         Other   Major depressive disorder with single episode, in partial remission (HCC) - Primary    He has a history of depression since high school, currently managed with Seroquel 150mg  and Zoloft 200mg . He reports fluctuating moods, with periods of anger and sadness, and a recent suicide attempt in September. He is interested in therapy. We will continue Seroquel 150mg  and Zoloft 100mg , refer him to Psychiatry for further management, and to a Therapist for additional support. Information given on the 24 hour urgent behavioral health care if needed. He currently denies SI.       Relevant Medications   sertraline (ZOLOFT) 100 MG tablet   Other Relevant Orders   Ambulatory referral to Psychiatry   History of suicide attempt    See plan below for depression. He states that his mood  tends to fluctuate and denies SI/HI. Referral placed to psychiatry.       Relevant Orders   Ambulatory referral to Psychiatry   History of arm fracture    He is currently following with orthopedics and doing PT. Continue collaboration and recommendations from specialist.       Chronic bilateral low back pain without sciatica    He reports back pain onset following a fall in September, exacerbated by movement and standing for prolonged periods, with associated tingling and balance issues. X-rays showed compression fractures of L1-L3. He has been using Celebrex for pain relief without a prescription. We will start Celebrex 200mg  daily for pain management and refer him to spinal surgery for further evaluation and management. He states the last provider he was referred to is out of network.       Relevant Medications   sertraline (ZOLOFT) 100 MG tablet   celecoxib (CELEBREX) 200 MG capsule   Other Relevant Orders   Ambulatory referral to Spine Surgery   Tobacco use    He reports smoking half a pack of cigarettes daily since age 63. We will encourage smoking cessation and provide resources when ready.         Follow up plan: Return in about 3 months (around 08/03/2023) for Depression, hypothyroid.  Nobuko Gsell A Lakeyia Surber

## 2023-05-03 NOTE — Assessment & Plan Note (Signed)
See plan below for depression. He states that his mood tends to fluctuate and denies SI/HI. Referral placed to psychiatry.

## 2023-05-03 NOTE — Assessment & Plan Note (Signed)
He reports smoking half a pack of cigarettes daily since age 27. We will encourage smoking cessation and provide resources when ready.

## 2023-05-03 NOTE — Therapy (Signed)
OUTPATIENT PHYSICAL THERAPY TREATMENT NOTE   Patient Name: Dominic Bryant MRN: [id number] DOB:03-25-1996, 27 y.o., male Today's Date: 05/03/2023  END OF SESSION:  PT End of Session - 05/03/23 1059     Visit Number 3    Number of Visits 17    Date for PT Re-Evaluation 06/19/23    Authorization Type [employer]    Authorization Time Period Submitted for 12 visits starting 05/01/23    PT Start Time 1100    PT Stop Time 1141    PT Time Calculation (min) 41 min    Activity Tolerance Patient tolerated treatment well    Behavior During Therapy [facility] for tasks assessed/performed               Past Medical History:  Diagnosis Date   Depression    Heart murmur    MDD (major depressive disorder)    Suicide attempt (HCC) 03/11/2023   Jumped off a bridge   Thyroid disease    Past Surgical History:  Procedure Laterality Date   ESOPHAGOGASTRODUODENOSCOPY (EGD) WITH PROPOFOL N/A 05/12/2015   Procedure: ESOPHAGOGASTRODUODENOSCOPY (EGD) WITH PROPOFOL;  Surgeon: Willis Modena, MD;  Location: [facility];  Service: Endoscopy;  Laterality: N/A;   EUS N/A 05/12/2015   Procedure: UPPER ENDOSCOPIC ULTRASOUND (EUS) RADIAL;  Surgeon: Willis Modena, MD;  Location: [facility];  Service: Endoscopy;  Laterality: N/A;   Patient Active Problem List   Diagnosis Date Noted   Suicide attempt (HCC) 03/12/2023   Class 2 severe obesity due to excess calories with serious comorbidity and body mass index (BMI) of 36.0 to 36.9 in adult [facility]) 10/09/2022   MDD (major depressive disorder), recurrent episode, severe (HCC) 09/28/2020   Encounter for general adult medical examination with abnormal findings 07/01/2020   Major depressive disorder with single episode, in partial remission (HCC) 07/01/2020   Acquired hypothyroidism 07/01/2020    PCP: Etta Grandchild, MD  REFERRING PROVIDER: Huel Cote, MD  REFERRING DIAG: S52.022A (ICD-10-CM) - Closed fracture of olecranon process of left ulna,  initial encounter Referral comment: "ROM and strengthening as tolerated L elbow"  THERAPY DIAG:  Pain in left elbow  Pain in left wrist  Stiffness of left elbow, not elsewhere classified  Muscle weakness (generalized)  Rationale for Evaluation and Treatment: Rehabilitation  ONSET DATE: 03/11/23  SUBJECTIVE:                                                                                                                                                                                     Per eval - Pt endorses fracture sustained in September 2024, was in a cast up until last week. He states that since removing  cast he has had difficulty with straightening and bending arm, as well as lifting items. Also endorses weakness in L arm, mild wrist pain at times. He denies any N/T, endorses stiffness as primary issue.  He also reports having a compression fracture in low back - states he was initially wearing a brace but has since been cleared from that. Unsure if he still has restrictions but does endorse some hip/low back stiffness and pain at times, particularly with bed mobility.  Hand dominance: Right  SUBJECTIVE STATEMENT: 05/03/2023 no pain at present, states elbow is still getting stiff at times but overall improving. No other new issues.    PERTINENT HISTORY: PMH: depression, suicide attempt 03/11/23  PAIN:  Are you having pain: yes in hips and tailbone, none in elbow Location/description: L elbow Best-worst over past week: 0-6/10  - aggravating factors: putting on socks, straightening arm, picking up objects (coffee cup, skillet) - Easing factors: cessation of provocative activity    PRECAUTIONS: L elbow fracture (ROM/strengthening per referral), lumbar compression fracture   WEIGHT BEARING RESTRICTIONS: No  FALLS:  Has patient fallen in last 6 months? No  LIVING ENVIRONMENT: Lives in 1 level home + basement; limited stair navigation in home Lives with mother, her husband, and  his sister Housework split between everyone, pt does a bit of cleaning and housework    OCCUPATION: Not working currently - enjoys movies, being on phone  PLOF: Independent  PATIENT GOALS: be able to pick stuff up more  NEXT MD VISIT: Nov 20th Dr. Steward Drone, PCP 11/7  OBJECTIVE:  Note: Objective measures were completed at Evaluation unless otherwise noted.  DIAGNOSTIC FINDINGS:  See EPIC for details  PATIENT SURVEYS:  FOTO 51 current, 56 predicted  COGNITION: Overall cognitive status: Within functional limits for tasks assessed     SENSATION: Denies sensory complaints   UPPER EXTREMITY ROM:   ROM Right eval Left eval Left 05/01/23 Left 05/03/23  Shoulder flexion      Shoulder extension      Shoulder abduction      Shoulder adduction      Shoulder internal rotation      Shoulder external rotation      Elbow flexion 130 deg 125 deg *  131 deg painless   Elbow extension Lacking 20 deg, painless Lacking 56 deg * Lacking 30 deg * Lacking 28 deg  Wrist flexion 80 58 *    Wrist extension      Wrist ulnar deviation symmetrical Symmetrical *    Wrist radial deviation Symmetrical  Symmetrical *    Wrist pronation 82 deg 80 deg    Wrist supination 85 deg 85 deg *    (Blank rows = not tested) (Key: WFL = within functional limits not formally assessed, * = concordant pain, s = stiffness/stretching sensation, NT = not tested)  Comment:   UPPER EXTREMITY MMT:  MMT Right eval Left eval  Shoulder flexion    Shoulder extension    Shoulder abduction    Shoulder adduction    Shoulder internal rotation    Shoulder external rotation    Middle trapezius    Lower trapezius    Elbow flexion    Elbow extension    Wrist flexion    Wrist extension    Wrist ulnar deviation    Wrist radial deviation    Wrist pronation    Wrist supination    Grip strength (lbs) 55# 15#  (Blank rows = not tested) (Key: WFL = within functional  limits not formally assessed, * = concordant pain, s  = stiffness/stretching sensation, NT = not tested)  Comment:     TODAY'S TREATMENT:                                                                                                                                         [facility] Adult PT Treatment:                                                DATE: 05/03/23 Therapeutic Exercise: Elbow flex/ext AAROM supinated x10; neutral x10, pronated x10 cues for comfortable ROM  Wrist flex/ext AROM x15 unresisted, x8 1# cues for comfortable ROM Towel grip (narrow) 2x15 supported Pronation/supination AROM x15, with 1# weight x8 cues for comfortable ROM with addition of weight  Passive elbow extension low load long duration w/ 1# weight, elbow supported w/ pillow 2x45sec Yellow band row BIL 3x10 cues for posture and scap retraction Radial deviation AROM x15 HEP update + education    [facility] Adult PT Treatment:                                                DATE: 05/01/23 Therapeutic Exercise: Elbow flex/ext AAROM 3x8 LUE (first set supinated, second neutral, third pronated) cues for comfortable ROM Wrist flex/ext 2x10 AROM LUE Towel gripping LUE x15  Supination/pronation AROM w/ towel grip 3x5 Shoulder flexion AROM x12  Supported radial deviation AROM 2x8 Scapular retraction x15 cues for form Verbal HEP review    [facility] Adult PT Treatment:                                                DATE: 04/24/23 Therapeutic Exercise: Elbow flex/ext AAROM x8 Towel gripping x8 Wrist flex/ext AROM x8 HEP handout + education, education on monitoring and appropriate performance   PATIENT EDUCATION: Education details: rationale for interventions, HEP  Person educated: Patient Education method: Explanation, Demonstration, Tactile cues, Verbal cues Education comprehension: verbalized understanding, returned demonstration, verbal cues required, tactile cues required, and needs further education    HOME EXERCISE PROGRAM: Access Code: [email or web address] URL:  [email or web address] Date: 05/03/2023 Prepared by: Fransisco Hertz  Exercises - Seated Elbow Flexion AAROM  - 3 x daily - 1 sets - 10 reps - Seated Gripping Towel  - 2-3 x daily - 1 sets - 15 reps - Wrist Flexion Extension AROM with Fingers Curled and Palm Down  - 2-3 x daily - 1 sets - 12 reps - Seated Forearm Pronation and  Supination AROM  - 2-3 x daily - 1 sets - 8 reps  ASSESSMENT:  CLINICAL IMPRESSION: 05/03/2023 Pt arrives w/o pain, reports improving stiffness. Today expanding volume with familiar mobility program and progressing for low load long duration stretching into extension and gentle resistance for wrist activities, both of which pt tolerates well. No adverse events, pt states he is following up with new PCP today. Reports improved stiffness and no pain on departure. Stiffness remains primary limiting factor rather than pain and this seems to be improving. Recommend continuing along current POC in order to address relevant deficits and improve functional tolerance. Pt departs today's session in no acute distress, all voiced questions/concerns addressed appropriately from PT perspective.      Per eval - Patient is a 27 y.o. gentleman who was seen today for physical therapy evaluation and treatment for elbow pain s/p fracture September 2024. He notes stiffness and weakness as primary limiting factors for household/daily tasks. On exam he demonstrates mobility limitations at elbow and wrist, as well as reduced grip strength. Tolerates exam and HEP well without any increase in resting pain, no adverse events. Recommend skilled PT to address aforementioned deficits with aim of improving functional tolerance and reducing pain with typical activities. Pt departs today's session in no acute distress, all voiced concerns/questions addressed appropriately from PT perspective.      OBJECTIVE IMPAIRMENTS: decreased activity tolerance, decreased endurance, decreased mobility,  difficulty walking, decreased ROM, decreased strength, impaired UE functional use, and pain.   ACTIVITY LIMITATIONS: carrying, lifting, and dressing  PARTICIPATION LIMITATIONS: meal prep, cleaning, laundry, and community activity  PERSONAL FACTORS: Time since onset of injury/illness/exacerbation and 1 comorbidity: depression  are also affecting patient's functional outcome.   REHAB POTENTIAL: Good  CLINICAL DECISION MAKING: Stable/uncomplicated  EVALUATION COMPLEXITY: Low   GOALS: Goals reviewed with patient? No  SHORT TERM GOALS: Target date: 05/22/2023 Pt will demonstrate appropriate understanding and performance of initially prescribed HEP in order to facilitate improved independence with management of symptoms.  Baseline: HEP provided on eval Goal status: INITIAL   2. Pt will score greater than or equal to 60 on FOTO in order to demonstrate improved perception of function due to symptoms.  Baseline: 51  Goal status: INITIAL    LONG TERM GOALS: Target date: 06/19/2023 Pt will score 68 on FOTO in order to demonstrate improved perception of function due to symptoms. Baseline: 51 Goal status: INITIAL  2.  Pt will demonstrate at least 10-130 degrees of active elbow ROM in order to demonstrate improved tolerance to daily tasks. Baseline: see ROM chart above Goal status: INITIAL  3. Pt will demonstrate LUE grip strength within at least 10# of contralateral limb in order to facilitate improved tolerance to functional tasks.   Baseline: see ROM chart above  Goal status: INITIAL  4. Pt will be able to lift up to 8# with LUE with less than 3 pt increase in pain in order to facilitate improved tolerance to household tasks such as cooking and cleaning.  Baseline: NT, pt endorses difficulty lifting coffee cups and skillets  Goal status: INITIAL    PLAN:  PT FREQUENCY: 2x/week  PT DURATION: 8 weeks  PLANNED INTERVENTIONS: 97164- PT Re-evaluation, 97110-Therapeutic exercises,  97530- Therapeutic activity, 97112- Neuromuscular re-education, 97535- Self Care, 16109- Manual therapy, [phone]- Gait training, Patient/Family education, Balance training, Stair training, Taping, Dry Needling, Joint mobilization, Cryotherapy, and Moist heat  PLAN FOR NEXT SESSION: Review/update HEP PRN. Work on [employer] as appropriate with emphasis on  elbow/wrist mobility. Symptom modification strategies as indicated/appropriate.     Ashley Murrain PT, DPT 05/03/2023 11:44 AM

## 2023-05-03 NOTE — Assessment & Plan Note (Signed)
He is currently following with orthopedics and doing PT. Continue collaboration and recommendations from specialist.

## 2023-05-08 ENCOUNTER — Ambulatory Visit: Payer: No Typology Code available for payment source

## 2023-05-08 DIAGNOSIS — M25522 Pain in left elbow: Secondary | ICD-10-CM

## 2023-05-08 DIAGNOSIS — M25622 Stiffness of left elbow, not elsewhere classified: Secondary | ICD-10-CM

## 2023-05-08 DIAGNOSIS — M6281 Muscle weakness (generalized): Secondary | ICD-10-CM

## 2023-05-08 DIAGNOSIS — M25532 Pain in left wrist: Secondary | ICD-10-CM

## 2023-05-08 NOTE — Therapy (Signed)
OUTPATIENT PHYSICAL THERAPY TREATMENT NOTE   Patient Name: Dominic Bryant MRN: [id number] DOB:December 07, 1995, 27 y.o., male Today's Date: 05/08/2023  END OF SESSION:  PT End of Session - 05/08/23 1346     Visit Number 4    Number of Visits 17    Date for PT Re-Evaluation 06/19/23    Authorization Type [email or web address]    Authorization Time Period Submitted for 12 visits starting 05/01/23    PT Start Time 1400    PT Stop Time 1440    PT Time Calculation (min) 40 min    Activity Tolerance Patient tolerated treatment well    Behavior During Therapy [facility] for tasks assessed/performed             Past Medical History:  Diagnosis Date   Depression    Heart murmur    MDD (major depressive disorder)    Suicide attempt (HCC) 03/11/2023   Jumped off a bridge   Thyroid disease    Past Surgical History:  Procedure Laterality Date   ESOPHAGOGASTRODUODENOSCOPY (EGD) WITH PROPOFOL N/A 05/12/2015   Procedure: ESOPHAGOGASTRODUODENOSCOPY (EGD) WITH PROPOFOL;  Surgeon: Willis Modena, MD;  Location: [facility];  Service: Endoscopy;  Laterality: N/A;   EUS N/A 05/12/2015   Procedure: UPPER ENDOSCOPIC ULTRASOUND (EUS) RADIAL;  Surgeon: Willis Modena, MD;  Location: [facility];  Service: Endoscopy;  Laterality: N/A;   Patient Active Problem List   Diagnosis Date Noted   History of suicide attempt 05/03/2023   History of arm fracture 05/03/2023   Chronic bilateral low back pain without sciatica 05/03/2023   Tobacco use 05/03/2023   Class 2 severe obesity due to excess calories with serious comorbidity and body mass index (BMI) of 36.0 to 36.9 in adult [facility] [place]) 10/09/2022   Major depressive disorder with single episode, in partial remission (HCC) 07/01/2020   Acquired hypothyroidism 07/01/2020    PCP: Etta Grandchild, MD  REFERRING PROVIDER: Huel Cote, MD  REFERRING DIAG: S52.022A (ICD-10-CM) - Closed fracture of olecranon process of left ulna, initial encounter Referral comment:  "ROM and strengthening as tolerated L elbow"  THERAPY DIAG:  Pain in left elbow  Pain in left wrist  Stiffness of left elbow, not elsewhere classified  Muscle weakness (generalized)  Rationale for Evaluation and Treatment: Rehabilitation  ONSET DATE: 03/11/23  SUBJECTIVE:                                                                                                                                                                                     Per eval - Pt endorses fracture sustained in September 2024, was in a cast up until last week. He states that since removing cast he  has had difficulty with straightening and bending arm, as well as lifting items. Also endorses weakness in L arm, mild wrist pain at times. He denies any N/T, endorses stiffness as primary issue.  He also reports having a compression fracture in low back - states he was initially wearing a brace but has since been cleared from that. Unsure if he still has restrictions but does endorse some hip/low back stiffness and pain at times, particularly with bed mobility.  Hand dominance: Right  SUBJECTIVE STATEMENT: Patient reports 5/10 pain in his elbow, 3/10 in his lower back. We have received referral to add in lower back pain.    PERTINENT HISTORY: PMH: depression, suicide attempt 03/11/23  PAIN:  Are you having pain: yes in hips and tailbone, none in elbow Location/description: L elbow Best-worst over past week: 0-6/10  - aggravating factors: putting on socks, straightening arm, picking up objects (coffee cup, skillet) - Easing factors: cessation of provocative activity    PRECAUTIONS: L elbow fracture (ROM/strengthening per referral), lumbar compression fracture   WEIGHT BEARING RESTRICTIONS: No  FALLS:  Has patient fallen in last 6 months? No  LIVING ENVIRONMENT: Lives in 1 level home + basement; limited stair navigation in home Lives with mother, her husband, and his sister Housework split between  everyone, pt does a bit of cleaning and housework    OCCUPATION: Not working currently - enjoys movies, being on phone  PLOF: Independent  PATIENT GOALS: be able to pick stuff up more  NEXT MD VISIT: Nov 20th Dr. Steward Drone, PCP 11/7  OBJECTIVE:  Note: Objective measures were completed at Evaluation unless otherwise noted.  DIAGNOSTIC FINDINGS:  See EPIC for details  PATIENT SURVEYS:  FOTO 51 current, 76 predicted  COGNITION: Overall cognitive status: Within functional limits for tasks assessed     SENSATION: Denies sensory complaints   UPPER EXTREMITY ROM:   ROM Right eval Left eval Left 05/01/23 Left 05/03/23   Shoulder flexion       Shoulder extension       Shoulder abduction       Shoulder adduction       Shoulder internal rotation       Shoulder external rotation       Elbow flexion 130 deg 125 deg *  131 deg painless    Elbow extension Lacking 20 deg, painless Lacking 56 deg * Lacking 30 deg * Lacking 28 deg   Wrist flexion 80 58 *     Wrist extension       Wrist ulnar deviation symmetrical Symmetrical *     Wrist radial deviation Symmetrical  Symmetrical *     Wrist pronation 82 deg 80 deg     Wrist supination 85 deg 85 deg *     (Blank rows = not tested) (Key: WFL = within functional limits not formally assessed, * = concordant pain, s = stiffness/stretching sensation, NT = not tested)  Comment:   UPPER EXTREMITY MMT:  MMT Right eval Left eval  Shoulder flexion    Shoulder extension    Shoulder abduction    Shoulder adduction    Shoulder internal rotation    Shoulder external rotation    Middle trapezius    Lower trapezius    Elbow flexion    Elbow extension    Wrist flexion    Wrist extension    Wrist ulnar deviation    Wrist radial deviation    Wrist pronation    Wrist supination    Grip  strength (lbs) 55# 15#  (Blank rows = not tested) (Key: WFL = within functional limits not formally assessed, * = concordant pain, s =  stiffness/stretching sensation, NT = not tested)  Comment:     TODAY'S TREATMENT:       [facility] Adult PT Treatment:                                                DATE: 05/08/23 Therapeutic Exercise: Elbow flex/ext AAROM supinated x10; neutral x10, pronated x10 cues for comfortable ROM  Wrist flex/ext AROM x15 unresisted, x8 1# cues for comfortable ROM Towel grip (narrow) 2x15 supported Pronation/supination AROM x15, with 1# weight x10 cues for comfortable ROM with addition of weight  Passive elbow extension low load long duration w/ 1# weight, elbow supported w/ pillow 2x45sec Yellow band row BIL 3x10 cues for posture and scap retraction Radial deviation AROM x15                                                                                                                                     [facility] Adult PT Treatment:                                                DATE: 05/03/23 Therapeutic Exercise: Elbow flex/ext AAROM supinated x10; neutral x10, pronated x10 cues for comfortable ROM  Wrist flex/ext AROM x15 unresisted, x8 1# cues for comfortable ROM Towel grip (narrow) 2x15 supported Pronation/supination AROM x15, with 1# weight x8 cues for comfortable ROM with addition of weight  Passive elbow extension low load long duration w/ 1# weight, elbow supported w/ pillow 2x45sec Yellow band row BIL 3x10 cues for posture and scap retraction Radial deviation AROM x15 HEP update + education    [facility] Adult PT Treatment:                                                DATE: 05/01/23 Therapeutic Exercise: Elbow flex/ext AAROM 3x8 LUE (first set supinated, second neutral, third pronated) cues for comfortable ROM Wrist flex/ext 2x10 AROM LUE Towel gripping LUE x15  Supination/pronation AROM w/ towel grip 3x5 Shoulder flexion AROM x12  Supported radial deviation AROM 2x8 Scapular retraction x15 cues for form Verbal HEP review    PATIENT EDUCATION: Education details: rationale for  interventions, HEP  Person educated: Patient Education method: Explanation, Demonstration, Tactile cues, Verbal cues Education comprehension: verbalized understanding, returned demonstration, verbal cues required, tactile cues required, and needs further education    HOME EXERCISE PROGRAM: Access  Code: [email or web address] URL: [email or web address] Date: 05/03/2023 Prepared by: Fransisco Hertz  Exercises - Seated Elbow Flexion AAROM  - 3 x daily - 1 sets - 10 reps - Seated Gripping Towel  - 2-3 x daily - 1 sets - 15 reps - Wrist Flexion Extension AROM with Fingers Curled and Palm Down  - 2-3 x daily - 1 sets - 12 reps - Seated Forearm Pronation and Supination AROM  - 2-3 x daily - 1 sets - 8 reps  ASSESSMENT:  CLINICAL IMPRESSION: Patient presents to PT reporting continued improvements in his elbow pain and stiffness and that he saw his MD about adding in lower back pain to his PT. Will add in at future sessions. Session today continued to focus on elbow ROM and gentle strengthening. Patient was able to tolerate all prescribed exercises with no adverse effects. Patient continues to benefit from skilled PT services and should be progressed as able to improve functional independence.     Per eval - Patient is a 27 y.o. gentleman who was seen today for physical therapy evaluation and treatment for elbow pain s/p fracture September 2024. He notes stiffness and weakness as primary limiting factors for household/daily tasks. On exam he demonstrates mobility limitations at elbow and wrist, as well as reduced grip strength. Tolerates exam and HEP well without any increase in resting pain, no adverse events. Recommend skilled PT to address aforementioned deficits with aim of improving functional tolerance and reducing pain with typical activities. Pt departs today's session in no acute distress, all voiced concerns/questions addressed appropriately from PT perspective.      OBJECTIVE IMPAIRMENTS:  decreased activity tolerance, decreased endurance, decreased mobility, difficulty walking, decreased ROM, decreased strength, impaired UE functional use, and pain.   ACTIVITY LIMITATIONS: carrying, lifting, and dressing  PARTICIPATION LIMITATIONS: meal prep, cleaning, laundry, and community activity  PERSONAL FACTORS: Time since onset of injury/illness/exacerbation and 1 comorbidity: depression  are also affecting patient's functional outcome.   REHAB POTENTIAL: Good  CLINICAL DECISION MAKING: Stable/uncomplicated  EVALUATION COMPLEXITY: Low   GOALS: Goals reviewed with patient? No  SHORT TERM GOALS: Target date: 05/22/2023 Pt will demonstrate appropriate understanding and performance of initially prescribed HEP in order to facilitate improved independence with management of symptoms.  Baseline: HEP provided on eval Goal status: INITIAL   2. Pt will score greater than or equal to 60 on FOTO in order to demonstrate improved perception of function due to symptoms.  Baseline: 51  Goal status: INITIAL    LONG TERM GOALS: Target date: 06/19/2023 Pt will score 68 on FOTO in order to demonstrate improved perception of function due to symptoms. Baseline: 51 Goal status: INITIAL  2.  Pt will demonstrate at least 10-130 degrees of active elbow ROM in order to demonstrate improved tolerance to daily tasks. Baseline: see ROM chart above Goal status: INITIAL  3. Pt will demonstrate LUE grip strength within at least 10# of contralateral limb in order to facilitate improved tolerance to functional tasks.   Baseline: see ROM chart above  Goal status: INITIAL  4. Pt will be able to lift up to 8# with LUE with less than 3 pt increase in pain in order to facilitate improved tolerance to household tasks such as cooking and cleaning.  Baseline: NT, pt endorses difficulty lifting coffee cups and skillets  Goal status: INITIAL    PLAN:  PT FREQUENCY: 2x/week  PT DURATION: 8 weeks  PLANNED  INTERVENTIONS: 97164- PT Re-evaluation, 97110-Therapeutic exercises, 97530- Therapeutic activity, 97112- Neuromuscular re-education,  [id number]- Self Care, 19147- Manual therapy, [phone]- Gait training, Patient/Family education, Balance training, Stair training, Taping, Dry Needling, Joint mobilization, Cryotherapy, and Moist heat  PLAN FOR NEXT SESSION: Review/update HEP PRN. Work on [employer] exercises as appropriate with emphasis on elbow/wrist mobility. Symptom modification strategies as indicated/appropriate.     Berta Minor PTA 05/08/2023 2:42 PM

## 2023-05-10 ENCOUNTER — Ambulatory Visit: Payer: No Typology Code available for payment source | Admitting: Physical Therapy

## 2023-05-10 DIAGNOSIS — M25522 Pain in left elbow: Secondary | ICD-10-CM

## 2023-05-10 DIAGNOSIS — M25622 Stiffness of left elbow, not elsewhere classified: Secondary | ICD-10-CM

## 2023-05-10 DIAGNOSIS — M6281 Muscle weakness (generalized): Secondary | ICD-10-CM

## 2023-05-10 DIAGNOSIS — M25532 Pain in left wrist: Secondary | ICD-10-CM

## 2023-05-10 NOTE — Therapy (Signed)
OUTPATIENT PHYSICAL THERAPY TREATMENT NOTE   Patient Name: Dominic Bryant MRN: [id number] DOB:Aug 18, 1995, 27 y.o., male Today's Date: 05/10/2023  END OF SESSION:  PT End of Session - 05/10/23 1444     Visit Number 5    Number of Visits 17    Date for PT Re-Evaluation 06/19/23    Authorization Type [employer]    Authorization Time Period Submitted for 12 visits starting 05/01/23    PT Start Time 1445    PT Stop Time 1523    PT Time Calculation (min) 38 min    Activity Tolerance Patient tolerated treatment well    Behavior During Therapy [facility] for tasks assessed/performed             Past Medical History:  Diagnosis Date   Depression    Heart murmur    MDD (major depressive disorder)    Suicide attempt (HCC) 03/11/2023   Jumped off a bridge   Thyroid disease    Past Surgical History:  Procedure Laterality Date   ESOPHAGOGASTRODUODENOSCOPY (EGD) WITH PROPOFOL N/A 05/12/2015   Procedure: ESOPHAGOGASTRODUODENOSCOPY (EGD) WITH PROPOFOL;  Surgeon: Willis Modena, MD;  Location: [facility];  Service: Endoscopy;  Laterality: N/A;   EUS N/A 05/12/2015   Procedure: UPPER ENDOSCOPIC ULTRASOUND (EUS) RADIAL;  Surgeon: Willis Modena, MD;  Location: [facility];  Service: Endoscopy;  Laterality: N/A;   Patient Active Problem List   Diagnosis Date Noted   History of suicide attempt 05/03/2023   History of arm fracture 05/03/2023   Chronic bilateral low back pain without sciatica 05/03/2023   Tobacco use 05/03/2023   Class 2 severe obesity due to excess calories with serious comorbidity and body mass index (BMI) of 36.0 to 36.9 in adult [facility]) 10/09/2022   Major depressive disorder with single episode, in partial remission (HCC) 07/01/2020   Acquired hypothyroidism 07/01/2020    PCP: Etta Grandchild, MD  REFERRING PROVIDER: Huel Cote, MD  REFERRING DIAG: S52.022A (ICD-10-CM) - Closed fracture of olecranon process of left ulna, initial encounter Referral comment:  "ROM and strengthening as tolerated L elbow"  THERAPY DIAG:  Pain in left elbow  Pain in left wrist  Stiffness of left elbow, not elsewhere classified  Muscle weakness (generalized)  Rationale for Evaluation and Treatment: Rehabilitation  ONSET DATE: 03/11/23  SUBJECTIVE:                                                                                                                                                                                     Per eval - Pt endorses fracture sustained in September 2024, was in a cast up until last week. He states that since removing cast he  has had difficulty with straightening and bending arm, as well as lifting items. Also endorses weakness in L arm, mild wrist pain at times. He denies any N/T, endorses stiffness as primary issue.  He also reports having a compression fracture in low back - states he was initially wearing a brace but has since been cleared from that. Unsure if he still has restrictions but does endorse some hip/low back stiffness and pain at times, particularly with bed mobility.  Hand dominance: Right  SUBJECTIVE STATEMENT: Patient reports 2/10 pain in his elbow and lower back today. We have unfortunately not received the the referral for his lower back yet, the referral in Epic is for neurosurgery.    PERTINENT HISTORY: PMH: depression, suicide attempt 03/11/23  PAIN:  Are you having pain: yes in hips and tailbone, none in elbow Location/description: L elbow Best-worst over past week: 0-6/10  - aggravating factors: putting on socks, straightening arm, picking up objects (coffee cup, skillet) - Easing factors: cessation of provocative activity    PRECAUTIONS: L elbow fracture (ROM/strengthening per referral), lumbar compression fracture   WEIGHT BEARING RESTRICTIONS: No  FALLS:  Has patient fallen in last 6 months? No  LIVING ENVIRONMENT: Lives in 1 level home + basement; limited stair navigation in home Lives with  mother, her husband, and his sister Housework split between everyone, pt does a bit of cleaning and housework    OCCUPATION: Not working currently - enjoys movies, being on phone  PLOF: Independent  PATIENT GOALS: be able to pick stuff up more  NEXT MD VISIT: Nov 20th Dr. Steward Drone, PCP 11/7  OBJECTIVE:  Note: Objective measures were completed at Evaluation unless otherwise noted.  DIAGNOSTIC FINDINGS:  See EPIC for details  PATIENT SURVEYS:  FOTO 51 current, 68 predicted  COGNITION: Overall cognitive status: Within functional limits for tasks assessed     SENSATION: Denies sensory complaints   UPPER EXTREMITY ROM:   ROM Right eval Left eval Left 05/01/23 Left 05/03/23   Shoulder flexion       Shoulder extension       Shoulder abduction       Shoulder adduction       Shoulder internal rotation       Shoulder external rotation       Elbow flexion 130 deg 125 deg *  131 deg painless    Elbow extension Lacking 20 deg, painless Lacking 56 deg * Lacking 30 deg * Lacking 28 deg   Wrist flexion 80 58 *     Wrist extension       Wrist ulnar deviation symmetrical Symmetrical *     Wrist radial deviation Symmetrical  Symmetrical *     Wrist pronation 82 deg 80 deg     Wrist supination 85 deg 85 deg *     (Blank rows = not tested) (Key: WFL = within functional limits not formally assessed, * = concordant pain, s = stiffness/stretching sensation, NT = not tested)  Comment:   UPPER EXTREMITY MMT:  MMT Right eval Left eval  Shoulder flexion    Shoulder extension    Shoulder abduction    Shoulder adduction    Shoulder internal rotation    Shoulder external rotation    Middle trapezius    Lower trapezius    Elbow flexion    Elbow extension    Wrist flexion    Wrist extension    Wrist ulnar deviation    Wrist radial deviation    Wrist pronation  Wrist supination    Grip strength (lbs) 55# 15#  (Blank rows = not tested) (Key: WFL = within functional limits not  formally assessed, * = concordant pain, s = stiffness/stretching sensation, NT = not tested)  Comment:     TODAY'S TREATMENT:     [facility] Adult PT Treatment:                                                DATE: 05/10/23 Therapeutic Exercise: Elbow flex/ext AAROM and AROM supinated x10; neutral x10, pronated x10 cues for comfortable ROM  Wrist flex/ext AROM x15 1# cues for comfortable ROM Towel grip (narrow) 2x15 supported Pronation/supination AROM x15, with 1# weight Passive elbow extension low load long duration w/ 1# weight, elbow supported w/ pillow 2x45sec Green band row BIL 3x10 cues for posture and scap retraction Radial deviation AROM x15 Putty gripping x1'     [facility] Adult PT Treatment:                                                DATE: 05/08/23 Therapeutic Exercise: Elbow flex/ext AAROM supinated x10; neutral x10, pronated x10 cues for comfortable ROM  Wrist flex/ext AROM x15 unresisted, x8 1# cues for comfortable ROM Towel grip (narrow) 2x15 supported Pronation/supination AROM x15, with 1# weight x10 cues for comfortable ROM with addition of weight  Passive elbow extension low load long duration w/ 1# weight, elbow supported w/ pillow 2x45sec Yellow band row BIL 3x10 cues for posture and scap retraction Radial deviation AROM x15                                                                                                                                     [facility] Adult PT Treatment:                                                DATE: 05/03/23 Therapeutic Exercise: Elbow flex/ext AAROM supinated x10; neutral x10, pronated x10 cues for comfortable ROM  Wrist flex/ext AROM x15 unresisted, x8 1# cues for comfortable ROM Towel grip (narrow) 2x15 supported Pronation/supination AROM x15, with 1# weight x8 cues for comfortable ROM with addition of weight  Passive elbow extension low load long duration w/ 1# weight, elbow supported w/ pillow 2x45sec Yellow band row BIL 3x10 cues for  posture and scap retraction Radial deviation AROM x15 HEP update + education    PATIENT EDUCATION: Education details: rationale for interventions, HEP  Person educated: Patient Education method: Explanation, Demonstration, Tactile cues, Verbal cues  Education comprehension: verbalized understanding, returned demonstration, verbal cues required, tactile cues required, and needs further education    HOME EXERCISE PROGRAM: Access Code: [email or web address] URL: [email or web address] Date: 05/03/2023 Prepared by: Fransisco Hertz  Exercises - Seated Elbow Flexion AAROM  - 3 x daily - 1 sets - 10 reps - Seated Gripping Towel  - 2-3 x daily - 1 sets - 15 reps - Wrist Flexion Extension AROM with Fingers Curled and Palm Down  - 2-3 x daily - 1 sets - 12 reps - Seated Forearm Pronation and Supination AROM  - 2-3 x daily - 1 sets - 8 reps  ASSESSMENT:  CLINICAL IMPRESSION: Patient presents to PT reporting minimal current pain in his elbow and lower back. Upon further chart review, noticed that the lower back referral was for neurosurgery and not for PT. Session today continued to focus on elbow ROM and gentle strengthening with addition of more AROM today to good effect. Patient was able to tolerate all prescribed exercises with no adverse effects. Patient continues to benefit from skilled PT services and should be progressed as able to improve functional independence.    Per eval - Patient is a 27 y.o. gentleman who was seen today for physical therapy evaluation and treatment for elbow pain s/p fracture September 2024. He notes stiffness and weakness as primary limiting factors for household/daily tasks. On exam he demonstrates mobility limitations at elbow and wrist, as well as reduced grip strength. Tolerates exam and HEP well without any increase in resting pain, no adverse events. Recommend skilled PT to address aforementioned deficits with aim of improving functional tolerance and reducing  pain with typical activities. Pt departs today's session in no acute distress, all voiced concerns/questions addressed appropriately from PT perspective.      OBJECTIVE IMPAIRMENTS: decreased activity tolerance, decreased endurance, decreased mobility, difficulty walking, decreased ROM, decreased strength, impaired UE functional use, and pain.   ACTIVITY LIMITATIONS: carrying, lifting, and dressing  PARTICIPATION LIMITATIONS: meal prep, cleaning, laundry, and community activity  PERSONAL FACTORS: Time since onset of injury/illness/exacerbation and 1 comorbidity: depression  are also affecting patient's functional outcome.   REHAB POTENTIAL: Good  CLINICAL DECISION MAKING: Stable/uncomplicated  EVALUATION COMPLEXITY: Low   GOALS: Goals reviewed with patient? No  SHORT TERM GOALS: Target date: 05/22/2023 Pt will demonstrate appropriate understanding and performance of initially prescribed HEP in order to facilitate improved independence with management of symptoms.  Baseline: HEP provided on eval Goal status: INITIAL   2. Pt will score greater than or equal to 60 on FOTO in order to demonstrate improved perception of function due to symptoms.  Baseline: 51  Goal status: INITIAL    LONG TERM GOALS: Target date: 06/19/2023 Pt will score 68 on FOTO in order to demonstrate improved perception of function due to symptoms. Baseline: 51 Goal status: INITIAL  2.  Pt will demonstrate at least 10-130 degrees of active elbow ROM in order to demonstrate improved tolerance to daily tasks. Baseline: see ROM chart above Goal status: INITIAL  3. Pt will demonstrate LUE grip strength within at least 10# of contralateral limb in order to facilitate improved tolerance to functional tasks.   Baseline: see ROM chart above  Goal status: INITIAL  4. Pt will be able to lift up to 8# with LUE with less than 3 pt increase in pain in order to facilitate improved tolerance to household tasks such as  cooking and cleaning.  Baseline: NT, pt endorses difficulty lifting coffee cups and skillets  Goal status:  INITIAL    PLAN:  PT FREQUENCY: 2x/week  PT DURATION: 8 weeks  PLANNED INTERVENTIONS: 97164- PT Re-evaluation, 97110-Therapeutic exercises, 97530- Therapeutic activity, 97112- Neuromuscular re-education, 97535- Self Care, 16109- Manual therapy, [id number]- Gait training, Patient/Family education, Balance training, Stair training, Taping, Dry Needling, Joint mobilization, Cryotherapy, and Moist heat  PLAN FOR NEXT SESSION: Review/update HEP PRN. Work on [employer] exercises as appropriate with emphasis on elbow/wrist mobility. Symptom modification strategies as indicated/appropriate.     Berta Minor PTA 05/10/2023 3:24 PM

## 2023-05-15 ENCOUNTER — Ambulatory Visit: Payer: No Typology Code available for payment source

## 2023-05-16 ENCOUNTER — Ambulatory Visit (HOSPITAL_BASED_OUTPATIENT_CLINIC_OR_DEPARTMENT_OTHER): Payer: No Typology Code available for payment source

## 2023-05-16 ENCOUNTER — Ambulatory Visit (HOSPITAL_BASED_OUTPATIENT_CLINIC_OR_DEPARTMENT_OTHER): Payer: No Typology Code available for payment source | Admitting: Orthopaedic Surgery

## 2023-05-16 DIAGNOSIS — S52022A Displaced fracture of olecranon process without intraarticular extension of left ulna, initial encounter for closed fracture: Secondary | ICD-10-CM

## 2023-05-16 DIAGNOSIS — M25522 Pain in left elbow: Secondary | ICD-10-CM | POA: Diagnosis not present

## 2023-05-16 NOTE — Progress Notes (Signed)
Chief Complaint: Left olecranon fracture     History of Present Illness:   05/16/2023: Presents today for follow-up out of his cast.  He has been able to actively extend at the elbow.  He has been working with therapy which is going well.  Dominic Bryant is a 27 y.o. male presents today now 6 weeks out from a left olecranon fracture after he had jumped off a bridge and in a suicide attempt.  He was initially seen in the hospital for several days and placed in a splint.  He had initially had follow-up with Dr. Hulda Humphrey but has subsequently seen Dr. Lajoyce Corners who had placed him into a long-arm cast.  He is here today for further discussion and management.  He does have a upcoming appointment scheduled with his primary care doctor    Surgical History:   None  PMH/PSH/Family History/Social History/Meds/Allergies:    Past Medical History:  Diagnosis Date   Depression    Heart murmur    MDD (major depressive disorder)    Suicide attempt (HCC) 03/11/2023   Jumped off a bridge   Thyroid disease    Past Surgical History:  Procedure Laterality Date   ESOPHAGOGASTRODUODENOSCOPY (EGD) WITH PROPOFOL N/A 05/12/2015   Procedure: ESOPHAGOGASTRODUODENOSCOPY (EGD) WITH PROPOFOL;  Surgeon: Willis Modena, MD;  Location: [facility];  Service: Endoscopy;  Laterality: N/A;   EUS N/A 05/12/2015   Procedure: UPPER ENDOSCOPIC ULTRASOUND (EUS) RADIAL;  Surgeon: Willis Modena, MD;  Location: [facility];  Service: Endoscopy;  Laterality: N/A;   Social History   Socioeconomic History   Marital status: Single    Spouse name: Not on file   Number of children: Not on file   Years of education: Not on file   Highest education level: Associate degree: occupational, Scientist, product/process development, or vocational program  Occupational History   Not on file  Tobacco Use   Smoking status: Every Day    Current packs/day: 0.50    Average packs/day: 0.5 packs/day for 7.9 years (3.9 ttl pk-yrs)     Types: Cigarettes    Start date: 2017   Smokeless tobacco: Never  Vaping Use   Vaping status: Never Used  Substance and Sexual Activity   Alcohol use: Yes    Comment: social   Drug use: Yes    Types: Marijuana   Sexual activity: Yes    Partners: Female    Birth control/protection: Condom  Other Topics Concern   Not on file  Social History Narrative   Not on file   Social Determinants of Health   Financial Resource Strain: Low Risk  (04/18/2023)   Overall Financial Resource Strain (CARDIA)    Difficulty of Paying Living Expenses: Not very hard  Food Insecurity: Food Insecurity Present (04/18/2023)   Hunger Vital Sign    Worried About Running Out of Food in the Last Year: Never true    Ran Out of Food in the Last Year: Sometimes true  Transportation Needs: No Transportation Needs (04/18/2023)   PRAPARE - Administrator, Civil Service (Medical): No    Lack of Transportation (Non-Medical): No  Physical Activity: Insufficiently Active (04/18/2023)   Exercise Vital Sign    Days of Exercise per Week: 7 days    Minutes of Exercise per Session: 10 min  Stress: Stress Concern Present (04/18/2023)  [employer] of Occupational Health - Occupational Stress Questionnaire    Feeling of Stress : Very much  Social Connections: Socially Isolated (04/18/2023)   Social Connection and Isolation Panel [NHANES]    Frequency of Communication with Friends and Family: Once a week    Frequency of Social Gatherings with Friends and Family: Once a week    Attends Religious Services: Never    Database administrator or Organizations: No    Attends Engineer, structural: Not on file    Marital Status: Never married   Family History  Problem Relation Age of Onset   Crohn's disease Mother    Asthma Father    Alcohol abuse Father    No Known Allergies Current Outpatient Medications  Medication Sig Dispense Refill   celecoxib (CELEBREX) 200 MG capsule Take 1 capsule (200  mg total) by mouth daily. 30 capsule 0   QUEtiapine (SEROQUEL XR) 150 MG 24 hr tablet Take 1 tablet (150 mg total) by mouth at bedtime. 30 tablet 0   sertraline (ZOLOFT) 100 MG tablet Take 2 tablets (200 mg total) by mouth daily. 60 tablet 0   No current facility-administered medications for this visit.   No results found.  Review of Systems:   A ROS was performed including pertinent positives and negatives as documented in the HPI.  Physical Exam :   Constitutional: NAD and appears stated age Neurological: Alert and oriented Psych: Appropriate affect and cooperative There were no vitals taken for this visit.   Comprehensive Musculoskeletal Exam:    Left arm cast is removed.  He does have active elbow flexion to approximately 90 degrees and active elbow extension 20 degrees short of full.  Otherwise swelling about the elbow.  Distal neurosensory exam is intact  Imaging:   Xray (3 views left elbow): Casted olecranon fracture with some interval callus   I personally reviewed and interpreted the radiographs.   Assessment:   27 y.o. male presents 10 weeks out from a left olecranon fracture currently in a long-arm cast.  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  Overall I do believe he did have quite a good outcome with this.  Overall his pain is improving.  He is working on elbow extension.  I would like him to continue to work on active elbow extension is much as he can tolerate.  I will plan to see him back in 2 months for final check.  Plan :    -Return to clinic 2 month     I personally saw and evaluated the patient, and participated in the management and treatment plan.  Huel Cote, MD Attending Physician, Orthopedic Surgery  This document was dictated using Dragon voice recognition software. A reasonable attempt at proof reading has been made to minimize errors.

## 2023-05-17 ENCOUNTER — Ambulatory Visit: Payer: No Typology Code available for payment source | Admitting: Internal Medicine

## 2023-05-17 ENCOUNTER — Encounter: Payer: Self-pay | Admitting: Physical Therapy

## 2023-05-17 ENCOUNTER — Ambulatory Visit: Payer: No Typology Code available for payment source | Admitting: Physical Therapy

## 2023-05-17 DIAGNOSIS — M25532 Pain in left wrist: Secondary | ICD-10-CM

## 2023-05-17 DIAGNOSIS — M6281 Muscle weakness (generalized): Secondary | ICD-10-CM

## 2023-05-17 DIAGNOSIS — M25522 Pain in left elbow: Secondary | ICD-10-CM

## 2023-05-17 DIAGNOSIS — M25622 Stiffness of left elbow, not elsewhere classified: Secondary | ICD-10-CM

## 2023-05-17 NOTE — Therapy (Signed)
OUTPATIENT PHYSICAL THERAPY TREATMENT NOTE   Patient Name: Dominic Bryant MRN: [id number] DOB:[birth year], 27 y.o., male Today's Date: 05/17/2023  END OF SESSION:  PT End of Session - 05/17/23 1400     Visit Number 6    Number of Visits 17    Date for PT Re-Evaluation 06/19/23    Authorization Type [email or web address]    Authorization Time Period 12 visits 05/01/23-06/16/23    Authorization - Visit Number 5    Authorization - Number of Visits 12    PT Start Time 1401    PT Stop Time 1441    PT Time Calculation (min) 40 min    Activity Tolerance Patient tolerated treatment well    Behavior During Therapy [facility] for tasks assessed/performed              Past Medical History:  Diagnosis Date   Depression    Heart murmur    MDD (major depressive disorder)    Suicide attempt (HCC) 03/11/2023   Jumped off a bridge   Thyroid disease    Past Surgical History:  Procedure Laterality Date   ESOPHAGOGASTRODUODENOSCOPY (EGD) WITH PROPOFOL N/A 05/12/2015   Procedure: ESOPHAGOGASTRODUODENOSCOPY (EGD) WITH PROPOFOL;  Surgeon: Willis Modena, MD;  Location: [facility];  Service: Endoscopy;  Laterality: N/A;   EUS N/A 05/12/2015   Procedure: UPPER ENDOSCOPIC ULTRASOUND (EUS) RADIAL;  Surgeon: Willis Modena, MD;  Location: [facility];  Service: Endoscopy;  Laterality: N/A;   Patient Active Problem List   Diagnosis Date Noted   History of suicide attempt 05/03/2023   History of arm fracture 05/03/2023   Chronic bilateral low back pain without sciatica 05/03/2023   Tobacco use 05/03/2023   Class 2 severe obesity due to excess calories with serious comorbidity and body mass index (BMI) of 36.0 to 36.9 in adult [facility]) 10/09/2022   Major depressive disorder with single episode, in partial remission (HCC) 07/01/2020   Acquired hypothyroidism 07/01/2020    PCP: Etta Grandchild, MD  REFERRING PROVIDER: Huel Cote, MD  REFERRING DIAG: S52.022A (ICD-10-CM) - Closed fracture of olecranon  process of left ulna, initial encounter Referral comment: "ROM and strengthening as tolerated L elbow"  THERAPY DIAG:  Pain in left elbow  Pain in left wrist  Stiffness of left elbow, not elsewhere classified  Muscle weakness (generalized)  Rationale for Evaluation and Treatment: Rehabilitation  ONSET DATE: 03/11/23  SUBJECTIVE:                                                                                                                                                                                     Per eval - Pt endorses fracture sustained in September 2024,  was in a cast up until last week. He states that since removing cast he has had difficulty with straightening and bending arm, as well as lifting items. Also endorses weakness in L arm, mild wrist pain at times. He denies any N/T, endorses stiffness as primary issue.  He also reports having a compression fracture in low back - states he was initially wearing a brace but has since been cleared from that. Unsure if he still has restrictions but does endorse some hip/low back stiffness and pain at times, particularly with bed mobility.  Hand dominance: Right  SUBJECTIVE STATEMENT: Pt states MD follow up went well yesterday, no concerns. States he was encouraged to continue working on extension. He feels he is able to move his arm more freely. No pain at present.    PERTINENT HISTORY: PMH: depression, suicide attempt 03/11/23  PAIN:  Are you having pain: yes in hips and tailbone, none in elbow Location/description: L elbow Best-worst over past week: 0-6/10  - aggravating factors: putting on socks, straightening arm, picking up objects (coffee cup, skillet) - Easing factors: cessation of provocative activity    PRECAUTIONS: L elbow fracture (ROM/strengthening per referral), lumbar compression fracture   WEIGHT BEARING RESTRICTIONS: No  FALLS:  Has patient fallen in last 6 months? No  LIVING ENVIRONMENT: Lives in 1  level home + basement; limited stair navigation in home Lives with mother, her husband, and his sister Housework split between everyone, pt does a bit of cleaning and housework    OCCUPATION: Not working currently - enjoys movies, being on phone  PLOF: Independent  PATIENT GOALS: be able to pick stuff up more  NEXT MD VISIT: TBD  OBJECTIVE:  Note: Objective measures were completed at Evaluation unless otherwise noted.  DIAGNOSTIC FINDINGS:  See EPIC for details  PATIENT SURVEYS:  FOTO 51 current, 58 predicted FOTO 05/17/23 53  COGNITION: Overall cognitive status: Within functional limits for tasks assessed     SENSATION: Denies sensory complaints   UPPER EXTREMITY ROM:   ROM Right eval Left eval Left 05/01/23 Left 05/03/23 Left 05/17/23  Shoulder flexion       Shoulder extension       Shoulder abduction       Shoulder adduction       Shoulder internal rotation       Shoulder external rotation       Elbow flexion 130 deg 125 deg *  131 deg painless    Elbow extension Lacking 20 deg, painless Lacking 56 deg * Lacking 30 deg * Lacking 28 deg Lacking 25  degrees actively, 24 deg passively with pain  Wrist flexion 80 58 *     Wrist extension       Wrist ulnar deviation symmetrical Symmetrical *     Wrist radial deviation Symmetrical  Symmetrical *     Wrist pronation 82 deg 80 deg     Wrist supination 85 deg 85 deg *     (Blank rows = not tested) (Key: WFL = within functional limits not formally assessed, * = concordant pain, s = stiffness/stretching sensation, NT = not tested)  Comment:   UPPER EXTREMITY MMT:  MMT Right eval Left eval  Shoulder flexion    Shoulder extension    Shoulder abduction    Shoulder adduction    Shoulder internal rotation    Shoulder external rotation    Middle trapezius    Lower trapezius    Elbow flexion    Elbow extension  Wrist flexion    Wrist extension    Wrist ulnar deviation    Wrist radial deviation    Wrist  pronation    Wrist supination    Grip strength (lbs) 55# 15#  (Blank rows = not tested) (Key: WFL = within functional limits not formally assessed, * = concordant pain, s = stiffness/stretching sensation, NT = not tested)  Comment:     TODAY'S TREATMENT:     [facility] Adult PT Treatment:                                                DATE: 05/17/23 Therapeutic Exercise: Green band row 2x12 cues for form Yellow band towel grip shoulder ext x8 (initial posterior elbow pain with first two reps, resolves with cues for improved positioning/form) Elbow supported flex/ext AAROM within tolerance 2x15 Elbow flexion/extension AROM with shoulder at 90 deg x10 Elbow flex/ext AROM w/ shoulder at 120 deg x10 Towel pianos x8 laps L hand cues for setup Wrist flex/ext elbow supported; 1# 2x10 Wrist pron/sup elbow supported; 1# 2x10 Wrist flex/ext/sup/pron AAROM x8 each cues for comfortable ROM     [facility] Adult PT Treatment:                                                DATE: 05/10/23 Therapeutic Exercise: Elbow flex/ext AAROM and AROM supinated x10; neutral x10, pronated x10 cues for comfortable ROM  Wrist flex/ext AROM x15 1# cues for comfortable ROM Towel grip (narrow) 2x15 supported Pronation/supination AROM x15, with 1# weight Passive elbow extension low load Dominic duration w/ 1# weight, elbow supported w/ pillow 2x45sec Green band row BIL 3x10 cues for posture and scap retraction Radial deviation AROM x15 Putty gripping x1'     [facility] Adult PT Treatment:                                                DATE: 05/08/23 Therapeutic Exercise: Elbow flex/ext AAROM supinated x10; neutral x10, pronated x10 cues for comfortable ROM  Wrist flex/ext AROM x15 unresisted, x8 1# cues for comfortable ROM Towel grip (narrow) 2x15 supported Pronation/supination AROM x15, with 1# weight x10 cues for comfortable ROM with addition of weight  Passive elbow extension low load Dominic duration w/ 1# weight, elbow  supported w/ pillow 2x45sec Yellow band row BIL 3x10 cues for posture and scap retraction Radial deviation AROM x15     PATIENT EDUCATION: Education details: rationale for interventions, HEP  Person educated: Patient Education method: Explanation, Demonstration, Tactile cues, Verbal cues Education comprehension: verbalized understanding, returned demonstration, verbal cues required, tactile cues required, and needs further education    HOME EXERCISE PROGRAM: Access Code: [email or web address]  ASSESSMENT:  CLINICAL IMPRESSION: 05/17/2023 Pt arrives w/o elbow pain, reports baseline LBP. Today continuing to progress UE ROM/strengthening activities. Tolerates quite well overall - does have some initial transient elbow pain with addition of yellow band shoulder extensions but this resolves with cues for position/form and no additional pain with subsequent activities. FOTO scored at 53 today which is modestly improved compared to initial eval. No adverse events, pt departs  without pain. Recommend continuing along current POC in order to address relevant deficits and improve functional tolerance. Pt departs today's session in no acute distress, all voiced questions/concerns addressed appropriately from PT perspective.     Per eval - Patient is a 27 y.o. gentleman who was seen today for physical therapy evaluation and treatment for elbow pain s/p fracture September 2024. He notes stiffness and weakness as primary limiting factors for household/daily tasks. On exam he demonstrates mobility limitations at elbow and wrist, as well as reduced grip strength. Tolerates exam and HEP well without any increase in resting pain, no adverse events. Recommend skilled PT to address aforementioned deficits with aim of improving functional tolerance and reducing pain with typical activities. Pt departs today's session in no acute distress, all voiced concerns/questions addressed appropriately from PT perspective.      OBJECTIVE  IMPAIRMENTS: decreased activity tolerance, decreased endurance, decreased mobility, difficulty walking, decreased ROM, decreased strength, impaired UE functional use, and pain.   ACTIVITY LIMITATIONS: carrying, lifting, and dressing  PARTICIPATION LIMITATIONS: meal prep, cleaning, laundry, and community activity  PERSONAL FACTORS: Time since onset of injury/illness/exacerbation and 1 comorbidity: depression  are also affecting patient's functional outcome.   REHAB POTENTIAL: Good  CLINICAL DECISION MAKING: Stable/uncomplicated  EVALUATION COMPLEXITY: Low   GOALS: Goals reviewed with patient? No  SHORT TERM GOALS: Target date: 05/22/2023 Pt will demonstrate appropriate understanding and performance of initially prescribed HEP in order to facilitate improved independence with management of symptoms.  Baseline: HEP provided on eval Goal status: INITIAL   2. Pt will score greater than or equal to 60 on FOTO in order to demonstrate improved perception of function due to symptoms.  Baseline: 51  Goal status: INITIAL    Dominic TERM GOALS: Target date: 06/19/2023 Pt will score 68 on FOTO in order to demonstrate improved perception of function due to symptoms. Baseline: 51 Goal status: INITIAL  2.  Pt will demonstrate at least 10-130 degrees of active elbow ROM in order to demonstrate improved tolerance to daily tasks. Baseline: see ROM chart above Goal status: INITIAL  3. Pt will demonstrate LUE grip strength within at least 10# of contralateral limb in order to facilitate improved tolerance to functional tasks.   Baseline: see ROM chart above  Goal status: INITIAL  4. Pt will be able to lift up to 8# with LUE with less than 3 pt increase in pain in order to facilitate improved tolerance to household tasks such as cooking and cleaning.  Baseline: NT, pt endorses difficulty lifting coffee cups and skillets  Goal status: INITIAL    PLAN:  PT FREQUENCY: 2x/week  PT DURATION: 8  weeks  PLANNED INTERVENTIONS: 97164- PT Re-evaluation, 97110-Therapeutic exercises, 97530- Therapeutic activity, 97112- Neuromuscular re-education, 97535- Self Care, 66063- Manual therapy, [phone]- Gait training, Patient/Family education, Balance training, Stair training, Taping, Dry Needling, Joint mobilization, Cryotherapy, and Moist heat  PLAN FOR NEXT SESSION: Review/update HEP PRN. Work on [employer] exercises as appropriate with emphasis on elbow/wrist mobility. Symptom modification strategies as indicated/appropriate.     Ashley Murrain PT, DPT 05/17/2023 5:14 PM

## 2023-05-21 ENCOUNTER — Encounter: Payer: Self-pay | Admitting: Nurse Practitioner

## 2023-05-22 ENCOUNTER — Ambulatory Visit: Payer: No Typology Code available for payment source

## 2023-05-22 DIAGNOSIS — M25532 Pain in left wrist: Secondary | ICD-10-CM

## 2023-05-22 DIAGNOSIS — M25622 Stiffness of left elbow, not elsewhere classified: Secondary | ICD-10-CM

## 2023-05-22 DIAGNOSIS — M25522 Pain in left elbow: Secondary | ICD-10-CM

## 2023-05-22 DIAGNOSIS — M6281 Muscle weakness (generalized): Secondary | ICD-10-CM

## 2023-05-22 NOTE — Therapy (Signed)
OUTPATIENT PHYSICAL THERAPY TREATMENT NOTE   Patient Name: Dominic Bryant MRN: [id number] DOB:25-Mar-1996, 27 y.o., male Today's Date: 05/22/2023  END OF SESSION:  PT End of Session - 05/22/23 1446     Visit Number 7    Number of Visits 17    Date for PT Re-Evaluation 06/19/23    Authorization Type [employer]    Authorization Time Period 12 visits 05/01/23-06/16/23    Authorization - Visit Number 6    Authorization - Number of Visits 12    PT Start Time 1445    PT Stop Time 1523    PT Time Calculation (min) 38 min    Activity Tolerance Patient tolerated treatment well    Behavior During Therapy [facility] for tasks assessed/performed               Past Medical History:  Diagnosis Date   Depression    Heart murmur    MDD (major depressive disorder)    Suicide attempt (HCC) 03/11/2023   Jumped off a bridge   Thyroid disease    Past Surgical History:  Procedure Laterality Date   ESOPHAGOGASTRODUODENOSCOPY (EGD) WITH PROPOFOL N/A 05/12/2015   Procedure: ESOPHAGOGASTRODUODENOSCOPY (EGD) WITH PROPOFOL;  Surgeon: Willis Modena, MD;  Location: [facility];  Service: Endoscopy;  Laterality: N/A;   EUS N/A 05/12/2015   Procedure: UPPER ENDOSCOPIC ULTRASOUND (EUS) RADIAL;  Surgeon: Willis Modena, MD;  Location: [facility];  Service: Endoscopy;  Laterality: N/A;   Patient Active Problem List   Diagnosis Date Noted   History of suicide attempt 05/03/2023   History of arm fracture 05/03/2023   Chronic bilateral low back pain without sciatica 05/03/2023   Tobacco use 05/03/2023   Class 2 severe obesity due to excess calories with serious comorbidity and body mass index (BMI) of 36.0 to 36.9 in adult [facility]) 10/09/2022   Major depressive disorder with single episode, in partial remission (HCC) 07/01/2020   Acquired hypothyroidism 07/01/2020    PCP: Etta Grandchild, MD  REFERRING PROVIDER: Huel Cote, MD  REFERRING DIAG: S52.022A (ICD-10-CM) - Closed fracture of  olecranon process of left ulna, initial encounter Referral comment: "ROM and strengthening as tolerated L elbow"  THERAPY DIAG:  Pain in left elbow  Pain in left wrist  Stiffness of left elbow, not elsewhere classified  Muscle weakness (generalized)  Rationale for Evaluation and Treatment: Rehabilitation  ONSET DATE: 03/11/23  SUBJECTIVE:                                                                                                                                                                                     Per eval - Pt endorses fracture sustained in September  2024, was in a cast up until last week. He states that since removing cast he has had difficulty with straightening and bending arm, as well as lifting items. Also endorses weakness in L arm, mild wrist pain at times. He denies any N/T, endorses stiffness as primary issue.  He also reports having a compression fracture in low back - states he was initially wearing a brace but has since been cleared from that. Unsure if he still has restrictions but does endorse some hip/low back stiffness and pain at times, particularly with bed mobility.  Hand dominance: Right  SUBJECTIVE STATEMENT: Patient reports no soreness from previous session, minimal pain currently in elbow, rates lower back pain at a 5/10. He notes that he has the most difficulty using the elbow with bathing and driving.    PERTINENT HISTORY: PMH: depression, suicide attempt 03/11/23  PAIN:  Are you having pain: yes in hips and tailbone, none in elbow Location/description: L elbow Best-worst over past week: 0-6/10  - aggravating factors: putting on socks, straightening arm, picking up objects (coffee cup, skillet) - Easing factors: cessation of provocative activity    PRECAUTIONS: L elbow fracture (ROM/strengthening per referral), lumbar compression fracture   WEIGHT BEARING RESTRICTIONS: No  FALLS:  Has patient fallen in last 6 months? No  LIVING  ENVIRONMENT: Lives in 1 level home + basement; limited stair navigation in home Lives with mother, her husband, and his sister Housework split between everyone, pt does a bit of cleaning and housework    OCCUPATION: Not working currently - enjoys movies, being on phone  PLOF: Independent  PATIENT GOALS: be able to pick stuff up more  NEXT MD VISIT: 06/05/23 with Dr. Lajoyce Corners  OBJECTIVE:  Note: Objective measures were completed at Evaluation unless otherwise noted.  DIAGNOSTIC FINDINGS:  See EPIC for details  PATIENT SURVEYS:  FOTO 51 current, 45 predicted FOTO 05/17/23 53  COGNITION: Overall cognitive status: Within functional limits for tasks assessed     SENSATION: Denies sensory complaints   UPPER EXTREMITY ROM:   ROM Right eval Left eval Left 05/01/23 Left 05/03/23 Left 05/17/23  Shoulder flexion       Shoulder extension       Shoulder abduction       Shoulder adduction       Shoulder internal rotation       Shoulder external rotation       Elbow flexion 130 deg 125 deg *  131 deg painless    Elbow extension Lacking 20 deg, painless Lacking 56 deg * Lacking 30 deg * Lacking 28 deg Lacking 25  degrees actively, 24 deg passively with pain  Wrist flexion 80 58 *     Wrist extension       Wrist ulnar deviation symmetrical Symmetrical *     Wrist radial deviation Symmetrical  Symmetrical *     Wrist pronation 82 deg 80 deg     Wrist supination 85 deg 85 deg *     (Blank rows = not tested) (Key: WFL = within functional limits not formally assessed, * = concordant pain, s = stiffness/stretching sensation, NT = not tested)  Comment:   UPPER EXTREMITY MMT:  MMT Right eval Left eval  Shoulder flexion    Shoulder extension    Shoulder abduction    Shoulder adduction    Shoulder internal rotation    Shoulder external rotation    Middle trapezius    Lower trapezius    Elbow flexion  Elbow extension    Wrist flexion    Wrist extension    Wrist ulnar deviation     Wrist radial deviation    Wrist pronation    Wrist supination    Grip strength (lbs) 55# 15#  (Blank rows = not tested) (Key: WFL = within functional limits not formally assessed, * = concordant pain, s = stiffness/stretching sensation, NT = not tested)  Comment:     TODAY'S TREATMENT:     [facility] Adult PT Treatment:                                                DATE: 05/22/23 Therapeutic Exercise: Green band row 2x12 cues for form Yellow band towel grip shoulder ext x10 (initial posterior elbow pain with first two reps, resolves with cues for improved positioning/form) Elbow flexion/extension AROM with shoulder at 90 deg 2x10 Elbow flex/ext AROM w/ shoulder at 120 deg x10 Towel pianos x8 laps L hand cues for setup Wrist flex/ext elbow supported; 1# 2x10 Wrist pron/sup elbow supported; 1# 2x10 Wrist flex/ext/sup/pron AAROM x8 each cues for comfortable ROM   [facility] Adult PT Treatment:                                                DATE: 05/17/23 Therapeutic Exercise: Green band row 2x12 cues for form Yellow band towel grip shoulder ext x8 (initial posterior elbow pain with first two reps, resolves with cues for improved positioning/form) Elbow supported flex/ext AAROM within tolerance 2x15 Elbow flexion/extension AROM with shoulder at 90 deg x10 Elbow flex/ext AROM w/ shoulder at 120 deg x10 Towel pianos x8 laps L hand cues for setup Wrist flex/ext elbow supported; 1# 2x10 Wrist pron/sup elbow supported; 1# 2x10 Wrist flex/ext/sup/pron AAROM x8 each cues for comfortable ROM   [facility] Adult PT Treatment:                                                DATE: 05/10/23 Therapeutic Exercise: Elbow flex/ext AAROM and AROM supinated x10; neutral x10, pronated x10 cues for comfortable ROM  Wrist flex/ext AROM x15 1# cues for comfortable ROM Towel grip (narrow) 2x15 supported Pronation/supination AROM x15, with 1# weight Passive elbow extension low load long duration w/ 1# weight, elbow  supported w/ pillow 2x45sec Green band row BIL 3x10 cues for posture and scap retraction Radial deviation AROM x15 Putty gripping x1'    PATIENT EDUCATION: Education details: rationale for interventions, HEP  Person educated: Patient Education method: Explanation, Demonstration, Tactile cues, Verbal cues Education comprehension: verbalized understanding, returned demonstration, verbal cues required, tactile cues required, and needs further education    HOME EXERCISE PROGRAM: Access Code: [email or web address]  ASSESSMENT:  CLINICAL IMPRESSION: Patient presents to PT reporting minimal pain in his elbow and moderate lower back pain. He reports HEP compliance and did not have any soreness from previous session. Session today continued to focus on elbow ROM and gentle strengthening. He continues to report minimal pain with shoulder extension. Patient was able to tolerate all prescribed exercises with no adverse effects. Patient continues to benefit from skilled PT  services and should be progressed as able to improve functional independence.    Per eval - Patient is a 27 y.o. gentleman who was seen today for physical therapy evaluation and treatment for elbow pain s/p fracture September 2024. He notes stiffness and weakness as primary limiting factors for household/daily tasks. On exam he demonstrates mobility limitations at elbow and wrist, as well as reduced grip strength. Tolerates exam and HEP well without any increase in resting pain, no adverse events. Recommend skilled PT to address aforementioned deficits with aim of improving functional tolerance and reducing pain with typical activities. Pt departs today's session in no acute distress, all voiced concerns/questions addressed appropriately from PT perspective.      OBJECTIVE IMPAIRMENTS: decreased activity tolerance, decreased endurance, decreased mobility, difficulty walking, decreased ROM, decreased strength, impaired UE functional use, and pain.    ACTIVITY LIMITATIONS: carrying, lifting, and dressing  PARTICIPATION LIMITATIONS: meal prep, cleaning, laundry, and community activity  PERSONAL FACTORS: Time since onset of injury/illness/exacerbation and 1 comorbidity: depression  are also affecting patient's functional outcome.   REHAB POTENTIAL: Good  CLINICAL DECISION MAKING: Stable/uncomplicated  EVALUATION COMPLEXITY: Low   GOALS: Goals reviewed with patient? No  SHORT TERM GOALS: Target date: 05/22/2023 Pt will demonstrate appropriate understanding and performance of initially prescribed HEP in order to facilitate improved independence with management of symptoms.  Baseline: HEP provided on eval Goal status: INITIAL   2. Pt will score greater than or equal to 60 on FOTO in order to demonstrate improved perception of function due to symptoms.  Baseline: 51  Goal status: INITIAL    LONG TERM GOALS: Target date: 06/19/2023 Pt will score 68 on FOTO in order to demonstrate improved perception of function due to symptoms. Baseline: 51 Goal status: INITIAL  2.  Pt will demonstrate at least 10-130 degrees of active elbow ROM in order to demonstrate improved tolerance to daily tasks. Baseline: see ROM chart above Goal status: INITIAL  3. Pt will demonstrate LUE grip strength within at least 10# of contralateral limb in order to facilitate improved tolerance to functional tasks.   Baseline: see ROM chart above  Goal status: INITIAL  4. Pt will be able to lift up to 8# with LUE with less than 3 pt increase in pain in order to facilitate improved tolerance to household tasks such as cooking and cleaning.  Baseline: NT, pt endorses difficulty lifting coffee cups and skillets  Goal status: INITIAL    PLAN:  PT FREQUENCY: 2x/week  PT DURATION: 8 weeks  PLANNED INTERVENTIONS: 97164- PT Re-evaluation, 97110-Therapeutic exercises, 97530- Therapeutic activity, 97112- Neuromuscular re-education, 97535- Self Care, 29528-  Manual therapy, [phone]- Gait training, Patient/Family education, Balance training, Stair training, Taping, Dry Needling, Joint mobilization, Cryotherapy, and Moist heat  PLAN FOR NEXT SESSION: Review/update HEP PRN. Work on [employer] exercises as appropriate with emphasis on elbow/wrist mobility. Symptom modification strategies as indicated/appropriate.     Berta Minor PTA 05/22/2023 3:33 PM

## 2023-06-04 ENCOUNTER — Ambulatory Visit: Payer: No Typology Code available for payment source

## 2023-06-04 NOTE — Therapy (Incomplete)
OUTPATIENT PHYSICAL THERAPY TREATMENT NOTE   Patient Name: Dominic Bryant MRN: [id number] DOB:[birth year], 27 y.o., male Today's Date: 06/04/2023  END OF SESSION:      Past Medical History:  Diagnosis Date   Depression    Heart murmur    MDD (major depressive disorder)    Suicide attempt (HCC) 03/11/2023   Jumped off a bridge   Thyroid disease    Past Surgical History:  Procedure Laterality Date   ESOPHAGOGASTRODUODENOSCOPY (EGD) WITH PROPOFOL N/A 05/12/2015   Procedure: ESOPHAGOGASTRODUODENOSCOPY (EGD) WITH PROPOFOL;  Surgeon: Willis Modena, MD;  Location: [facility];  Service: Endoscopy;  Laterality: N/A;   EUS N/A 05/12/2015   Procedure: UPPER ENDOSCOPIC ULTRASOUND (EUS) RADIAL;  Surgeon: Willis Modena, MD;  Location: [facility];  Service: Endoscopy;  Laterality: N/A;   Patient Active Problem List   Diagnosis Date Noted   History of suicide attempt 05/03/2023   History of arm fracture 05/03/2023   Chronic bilateral low back pain without sciatica 05/03/2023   Tobacco use 05/03/2023   Class 2 severe obesity due to excess calories with serious comorbidity and body mass index (BMI) of 36.0 to 36.9 in adult [facility]) 10/09/2022   Major depressive disorder with single episode, in partial remission (HCC) 07/01/2020   Acquired hypothyroidism 07/01/2020    PCP: Etta Grandchild, MD  REFERRING PROVIDER: Huel Cote, MD  REFERRING DIAG: S52.022A (ICD-10-CM) - Closed fracture of olecranon process of left ulna, initial encounter Referral comment: "ROM and strengthening as tolerated L elbow"  THERAPY DIAG:  No diagnosis found.  Rationale for Evaluation and Treatment: Rehabilitation  ONSET DATE: 03/11/23  SUBJECTIVE:                                                                                                                                                                                     Per eval - Pt endorses fracture sustained in September 2024, was in a cast up  until last week. He states that since removing cast he has had difficulty with straightening and bending arm, as well as lifting items. Also endorses weakness in L arm, mild wrist pain at times. He denies any N/T, endorses stiffness as primary issue.  He also reports having a compression fracture in low back - states he was initially wearing a brace but has since been cleared from that. Unsure if he still has restrictions but does endorse some hip/low back stiffness and pain at times, particularly with bed mobility.  Hand dominance: Right  SUBJECTIVE STATEMENT: Patient reports no soreness from previous session, minimal pain currently in elbow, rates lower back pain at a 5/10. He notes that he has the most difficulty using  the elbow with bathing and driving.    PERTINENT HISTORY: PMH: depression, suicide attempt 03/11/23  PAIN:  Are you having pain: yes in hips and tailbone, none in elbow Location/description: L elbow Best-worst over past week: 0-6/10  - aggravating factors: putting on socks, straightening arm, picking up objects (coffee cup, skillet) - Easing factors: cessation of provocative activity    PRECAUTIONS: L elbow fracture (ROM/strengthening per referral), lumbar compression fracture   WEIGHT BEARING RESTRICTIONS: No  FALLS:  Has patient fallen in last 6 months? No  LIVING ENVIRONMENT: Lives in 1 level home + basement; limited stair navigation in home Lives with mother, her husband, and his sister Housework split between everyone, pt does a bit of cleaning and housework    OCCUPATION: Not working currently - enjoys movies, being on phone  PLOF: Independent  PATIENT GOALS: be able to pick stuff up more  NEXT MD VISIT: 06/05/23 with Dr. Lajoyce Corners  OBJECTIVE:  Note: Objective measures were completed at Evaluation unless otherwise noted.  DIAGNOSTIC FINDINGS:  See EPIC for details  PATIENT SURVEYS:  FOTO 51 current, 14 predicted FOTO 05/17/23  53  COGNITION: Overall cognitive status: Within functional limits for tasks assessed     SENSATION: Denies sensory complaints   UPPER EXTREMITY ROM:   ROM Right eval Left eval Left 05/01/23 Left 05/03/23 Left 05/17/23  Shoulder flexion       Shoulder extension       Shoulder abduction       Shoulder adduction       Shoulder internal rotation       Shoulder external rotation       Elbow flexion 130 deg 125 deg *  131 deg painless    Elbow extension Lacking 20 deg, painless Lacking 56 deg * Lacking 30 deg * Lacking 28 deg Lacking 25  degrees actively, 24 deg passively with pain  Wrist flexion 80 58 *     Wrist extension       Wrist ulnar deviation symmetrical Symmetrical *     Wrist radial deviation Symmetrical  Symmetrical *     Wrist pronation 82 deg 80 deg     Wrist supination 85 deg 85 deg *     (Blank rows = not tested) (Key: WFL = within functional limits not formally assessed, * = concordant pain, s = stiffness/stretching sensation, NT = not tested)  Comment:   UPPER EXTREMITY MMT:  MMT Right eval Left eval  Shoulder flexion    Shoulder extension    Shoulder abduction    Shoulder adduction    Shoulder internal rotation    Shoulder external rotation    Middle trapezius    Lower trapezius    Elbow flexion    Elbow extension    Wrist flexion    Wrist extension    Wrist ulnar deviation    Wrist radial deviation    Wrist pronation    Wrist supination    Grip strength (lbs) 55# 15#  (Blank rows = not tested) (Key: WFL = within functional limits not formally assessed, * = concordant pain, s = stiffness/stretching sensation, NT = not tested)  Comment:     TODAY'S TREATMENT:     [facility] Adult PT Treatment:  DATE: 06/04/23 Therapeutic Exercise: Green band row 2x12 cues for form Yellow band towel grip shoulder ext x10 (initial posterior elbow pain with first two reps, resolves with cues for improved  positioning/form) Elbow flexion/extension AROM with shoulder at 90 deg 2x10 Elbow flex/ext AROM w/ shoulder at 120 deg x10 Towel pianos x8 laps L hand cues for setup Wrist flex/ext elbow supported; 1# 2x10 Wrist pron/sup elbow supported; 1# 2x10 Wrist flex/ext/sup/pron AAROM x8 each cues for comfortable ROM Lifting 1# into cabinet bottom/middle shelf ER/IR YTB Seated OH press   [facility] Adult PT Treatment:                                                DATE: 05/22/23 Therapeutic Exercise: Green band row 2x12 cues for form Yellow band towel grip shoulder ext x10 (initial posterior elbow pain with first two reps, resolves with cues for improved positioning/form) Elbow flexion/extension AROM with shoulder at 90 deg 2x10 Elbow flex/ext AROM w/ shoulder at 120 deg x10 Towel pianos x8 laps L hand cues for setup Wrist flex/ext elbow supported; 1# 2x10 Wrist pron/sup elbow supported; 1# 2x10 Wrist flex/ext/sup/pron AAROM x8 each cues for comfortable ROM   [facility] Adult PT Treatment:                                                DATE: 05/17/23 Therapeutic Exercise: Green band row 2x12 cues for form Yellow band towel grip shoulder ext x8 (initial posterior elbow pain with first two reps, resolves with cues for improved positioning/form) Elbow supported flex/ext AAROM within tolerance 2x15 Elbow flexion/extension AROM with shoulder at 90 deg x10 Elbow flex/ext AROM w/ shoulder at 120 deg x10 Towel pianos x8 laps L hand cues for setup Wrist flex/ext elbow supported; 1# 2x10 Wrist pron/sup elbow supported; 1# 2x10 Wrist flex/ext/sup/pron AAROM x8 each cues for comfortable ROM   PATIENT EDUCATION: Education details: rationale for interventions, HEP  Person educated: Patient Education method: Explanation, Demonstration, Tactile cues, Verbal cues Education comprehension: verbalized understanding, returned demonstration, verbal cues required, tactile cues required, and needs further education     HOME EXERCISE PROGRAM: Access Code: [id number]  ASSESSMENT:  CLINICAL IMPRESSION: ***  Patient presents to PT reporting minimal pain in his elbow and moderate lower back pain. He reports HEP compliance and did not have any soreness from previous session. Session today continued to focus on elbow ROM and gentle strengthening. He continues to report minimal pain with shoulder extension. Patient was able to tolerate all prescribed exercises with no adverse effects. Patient continues to benefit from skilled PT services and should be progressed as able to improve functional independence.    Per eval - Patient is a 28 y.o. gentleman who was seen today for physical therapy evaluation and treatment for elbow pain s/p fracture September 2024. He notes stiffness and weakness as primary limiting factors for household/daily tasks. On exam he demonstrates mobility limitations at elbow and wrist, as well as reduced grip strength. Tolerates exam and HEP well without any increase in resting pain, no adverse events. Recommend skilled PT to address aforementioned deficits with aim of improving functional tolerance and reducing pain with typical activities. Pt departs today's session in no acute distress, all voiced concerns/questions addressed  appropriately from PT perspective.      OBJECTIVE IMPAIRMENTS: decreased activity tolerance, decreased endurance, decreased mobility, difficulty walking, decreased ROM, decreased strength, impaired UE functional use, and pain.   ACTIVITY LIMITATIONS: carrying, lifting, and dressing  PARTICIPATION LIMITATIONS: meal prep, cleaning, laundry, and community activity  PERSONAL FACTORS: Time since onset of injury/illness/exacerbation and 1 comorbidity: depression  are also affecting patient's functional outcome.   REHAB POTENTIAL: Good  CLINICAL DECISION MAKING: Stable/uncomplicated  EVALUATION COMPLEXITY: Low   GOALS: Goals reviewed with patient? No  SHORT TERM GOALS:  Target date: 05/22/2023 Pt will demonstrate appropriate understanding and performance of initially prescribed HEP in order to facilitate improved independence with management of symptoms.  Baseline: HEP provided on eval Goal status: INITIAL   2. Pt will score greater than or equal to 60 on FOTO in order to demonstrate improved perception of function due to symptoms.  Baseline: 51  Goal status: INITIAL    LONG TERM GOALS: Target date: 06/19/2023 Pt will score 68 on FOTO in order to demonstrate improved perception of function due to symptoms. Baseline: 51 Goal status: INITIAL  2.  Pt will demonstrate at least 10-130 degrees of active elbow ROM in order to demonstrate improved tolerance to daily tasks. Baseline: see ROM chart above Goal status: INITIAL  3. Pt will demonstrate LUE grip strength within at least 10# of contralateral limb in order to facilitate improved tolerance to functional tasks.   Baseline: see ROM chart above  Goal status: INITIAL  4. Pt will be able to lift up to 8# with LUE with less than 3 pt increase in pain in order to facilitate improved tolerance to household tasks such as cooking and cleaning.  Baseline: NT, pt endorses difficulty lifting coffee cups and skillets  Goal status: INITIAL    PLAN:  PT FREQUENCY: 2x/week  PT DURATION: 8 weeks  PLANNED INTERVENTIONS: 97164- PT Re-evaluation, 97110-Therapeutic exercises, 97530- Therapeutic activity, 97112- Neuromuscular re-education, 97535- Self Care, 41324- Manual therapy, [id number]- Gait training, Patient/Family education, Balance training, Stair training, Taping, Dry Needling, Joint mobilization, Cryotherapy, and Moist heat  PLAN FOR NEXT SESSION: Review/update HEP PRN. Work on [employer] exercises as appropriate with emphasis on elbow/wrist mobility. Symptom modification strategies as indicated/appropriate.     Berta Minor PTA 06/04/2023 11:32 AM

## 2023-06-05 ENCOUNTER — Ambulatory Visit: Payer: No Typology Code available for payment source | Admitting: Orthopedic Surgery

## 2023-06-06 ENCOUNTER — Telehealth: Payer: Self-pay | Admitting: Nurse Practitioner

## 2023-06-06 ENCOUNTER — Ambulatory Visit: Payer: No Typology Code available for payment source

## 2023-06-06 NOTE — Telephone Encounter (Signed)
LVM to return call.

## 2023-06-06 NOTE — Telephone Encounter (Signed)
Dominic Bryant from [employer] called to speak with the nurse per pt regarding paper work with questionnaires that was faxed to the office since May 29, 2023. She wants a call back at [phone]. Ext [phone]

## 2023-06-06 NOTE — Therapy (Incomplete)
OUTPATIENT PHYSICAL THERAPY TREATMENT NOTE   Patient Name: Dominic Bryant MRN: [id number] DOB:[birth year], 27 y.o., male Today's Date: 06/06/2023  END OF SESSION:      Past Medical History:  Diagnosis Date   Depression    Heart murmur    MDD (major depressive disorder)    Suicide attempt (HCC) 03/11/2023   Jumped off a bridge   Thyroid disease    Past Surgical History:  Procedure Laterality Date   ESOPHAGOGASTRODUODENOSCOPY (EGD) WITH PROPOFOL N/A 05/12/2015   Procedure: ESOPHAGOGASTRODUODENOSCOPY (EGD) WITH PROPOFOL;  Surgeon: Willis Modena, MD;  Location: [facility];  Service: Endoscopy;  Laterality: N/A;   EUS N/A 05/12/2015   Procedure: UPPER ENDOSCOPIC ULTRASOUND (EUS) RADIAL;  Surgeon: Willis Modena, MD;  Location: [facility];  Service: Endoscopy;  Laterality: N/A;   Patient Active Problem List   Diagnosis Date Noted   History of suicide attempt 05/03/2023   History of arm fracture 05/03/2023   Chronic bilateral low back pain without sciatica 05/03/2023   Tobacco use 05/03/2023   Class 2 severe obesity due to excess calories with serious comorbidity and body mass index (BMI) of 36.0 to 36.9 in adult [facility]) 10/09/2022   Major depressive disorder with single episode, in partial remission (HCC) 07/01/2020   Acquired hypothyroidism 07/01/2020    PCP: Etta Grandchild, MD  REFERRING PROVIDER: Huel Cote, MD  REFERRING DIAG: S52.022A (ICD-10-CM) - Closed fracture of olecranon process of left ulna, initial encounter Referral comment: "ROM and strengthening as tolerated L elbow"  THERAPY DIAG:  No diagnosis found.  Rationale for Evaluation and Treatment: Rehabilitation  ONSET DATE: 03/11/23  SUBJECTIVE:                                                                                                                                                                                     Per eval - Pt endorses fracture sustained in September 2024, was in a cast up  until last week. He states that since removing cast he has had difficulty with straightening and bending arm, as well as lifting items. Also endorses weakness in L arm, mild wrist pain at times. He denies any N/T, endorses stiffness as primary issue.  He also reports having a compression fracture in low back - states he was initially wearing a brace but has since been cleared from that. Unsure if he still has restrictions but does endorse some hip/low back stiffness and pain at times, particularly with bed mobility.  Hand dominance: Right  SUBJECTIVE STATEMENT: Patient reports no soreness from previous session, minimal pain currently in elbow, rates lower back pain at a 5/10. He notes that he has the most difficulty using  the elbow with bathing and driving.    PERTINENT HISTORY: PMH: depression, suicide attempt 03/11/23  PAIN:  Are you having pain: yes in hips and tailbone, none in elbow Location/description: L elbow Best-worst over past week: 0-6/10  - aggravating factors: putting on socks, straightening arm, picking up objects (coffee cup, skillet) - Easing factors: cessation of provocative activity    PRECAUTIONS: L elbow fracture (ROM/strengthening per referral), lumbar compression fracture   WEIGHT BEARING RESTRICTIONS: No  FALLS:  Has patient fallen in last 6 months? No  LIVING ENVIRONMENT: Lives in 1 level home + basement; limited stair navigation in home Lives with mother, her husband, and his sister Housework split between everyone, pt does a bit of cleaning and housework    OCCUPATION: Not working currently - enjoys movies, being on phone  PLOF: Independent  PATIENT GOALS: be able to pick stuff up more  NEXT MD VISIT: 06/05/23 with Dr. Lajoyce Corners  OBJECTIVE:  Note: Objective measures were completed at Evaluation unless otherwise noted.  DIAGNOSTIC FINDINGS:  See EPIC for details  PATIENT SURVEYS:  FOTO 51 current, 73 predicted FOTO 05/17/23  53  COGNITION: Overall cognitive status: Within functional limits for tasks assessed     SENSATION: Denies sensory complaints   UPPER EXTREMITY ROM:   ROM Right eval Left eval Left 05/01/23 Left 05/03/23 Left 05/17/23  Shoulder flexion       Shoulder extension       Shoulder abduction       Shoulder adduction       Shoulder internal rotation       Shoulder external rotation       Elbow flexion 130 deg 125 deg *  131 deg painless    Elbow extension Lacking 20 deg, painless Lacking 56 deg * Lacking 30 deg * Lacking 28 deg Lacking 25  degrees actively, 24 deg passively with pain  Wrist flexion 80 58 *     Wrist extension       Wrist ulnar deviation symmetrical Symmetrical *     Wrist radial deviation Symmetrical  Symmetrical *     Wrist pronation 82 deg 80 deg     Wrist supination 85 deg 85 deg *     (Blank rows = not tested) (Key: WFL = within functional limits not formally assessed, * = concordant pain, s = stiffness/stretching sensation, NT = not tested)  Comment:   UPPER EXTREMITY MMT:  MMT Right eval Left eval  Shoulder flexion    Shoulder extension    Shoulder abduction    Shoulder adduction    Shoulder internal rotation    Shoulder external rotation    Middle trapezius    Lower trapezius    Elbow flexion    Elbow extension    Wrist flexion    Wrist extension    Wrist ulnar deviation    Wrist radial deviation    Wrist pronation    Wrist supination    Grip strength (lbs) 55# 15#  (Blank rows = not tested) (Key: WFL = within functional limits not formally assessed, * = concordant pain, s = stiffness/stretching sensation, NT = not tested)  Comment:     TODAY'S TREATMENT:     [facility] Adult PT Treatment:  DATE: 06/06/23 Therapeutic Exercise: Green band row 2x12 cues for form Yellow band towel grip shoulder ext x10 (initial posterior elbow pain with first two reps, resolves with cues for improved  positioning/form) Elbow flexion/extension AROM with shoulder at 90 deg 2x10 Elbow flex/ext AROM w/ shoulder at 120 deg x10 Towel pianos x8 laps L hand cues for setup Wrist flex/ext elbow supported; 1# 2x10 Wrist pron/sup elbow supported; 1# 2x10 Wrist flex/ext/sup/pron AAROM x8 each cues for comfortable ROM Lifting 1# into cabinet bottom/middle shelf ER/IR YTB Seated OH press   [facility] Adult PT Treatment:                                                DATE: 05/22/23 Therapeutic Exercise: Green band row 2x12 cues for form Yellow band towel grip shoulder ext x10 (initial posterior elbow pain with first two reps, resolves with cues for improved positioning/form) Elbow flexion/extension AROM with shoulder at 90 deg 2x10 Elbow flex/ext AROM w/ shoulder at 120 deg x10 Towel pianos x8 laps L hand cues for setup Wrist flex/ext elbow supported; 1# 2x10 Wrist pron/sup elbow supported; 1# 2x10 Wrist flex/ext/sup/pron AAROM x8 each cues for comfortable ROM   [facility] Adult PT Treatment:                                                DATE: 05/17/23 Therapeutic Exercise: Green band row 2x12 cues for form Yellow band towel grip shoulder ext x8 (initial posterior elbow pain with first two reps, resolves with cues for improved positioning/form) Elbow supported flex/ext AAROM within tolerance 2x15 Elbow flexion/extension AROM with shoulder at 90 deg x10 Elbow flex/ext AROM w/ shoulder at 120 deg x10 Towel pianos x8 laps L hand cues for setup Wrist flex/ext elbow supported; 1# 2x10 Wrist pron/sup elbow supported; 1# 2x10 Wrist flex/ext/sup/pron AAROM x8 each cues for comfortable ROM   PATIENT EDUCATION: Education details: rationale for interventions, HEP  Person educated: Patient Education method: Explanation, Demonstration, Tactile cues, Verbal cues Education comprehension: verbalized understanding, returned demonstration, verbal cues required, tactile cues required, and needs further education     HOME EXERCISE PROGRAM: Access Code: [id number]  ASSESSMENT:  CLINICAL IMPRESSION: ***  Patient presents to PT reporting minimal pain in his elbow and moderate lower back pain. He reports HEP compliance and did not have any soreness from previous session. Session today continued to focus on elbow ROM and gentle strengthening. He continues to report minimal pain with shoulder extension. Patient was able to tolerate all prescribed exercises with no adverse effects. Patient continues to benefit from skilled PT services and should be progressed as able to improve functional independence.    Per eval - Patient is a 27 y.o. gentleman who was seen today for physical therapy evaluation and treatment for elbow pain s/p fracture September 2024. He notes stiffness and weakness as primary limiting factors for household/daily tasks. On exam he demonstrates mobility limitations at elbow and wrist, as well as reduced grip strength. Tolerates exam and HEP well without any increase in resting pain, no adverse events. Recommend skilled PT to address aforementioned deficits with aim of improving functional tolerance and reducing pain with typical activities. Pt departs today's session in no acute distress, all voiced concerns/questions addressed  appropriately from PT perspective.      OBJECTIVE IMPAIRMENTS: decreased activity tolerance, decreased endurance, decreased mobility, difficulty walking, decreased ROM, decreased strength, impaired UE functional use, and pain.   ACTIVITY LIMITATIONS: carrying, lifting, and dressing  PARTICIPATION LIMITATIONS: meal prep, cleaning, laundry, and community activity  PERSONAL FACTORS: Time since onset of injury/illness/exacerbation and 1 comorbidity: depression  are also affecting patient's functional outcome.   REHAB POTENTIAL: Good  CLINICAL DECISION MAKING: Stable/uncomplicated  EVALUATION COMPLEXITY: Low   GOALS: Goals reviewed with patient? No  SHORT TERM GOALS:  Target date: 05/22/2023 Pt will demonstrate appropriate understanding and performance of initially prescribed HEP in order to facilitate improved independence with management of symptoms.  Baseline: HEP provided on eval Goal status: INITIAL   2. Pt will score greater than or equal to 60 on FOTO in order to demonstrate improved perception of function due to symptoms.  Baseline: 51  Goal status: INITIAL    LONG TERM GOALS: Target date: 06/19/2023 Pt will score 68 on FOTO in order to demonstrate improved perception of function due to symptoms. Baseline: 51 Goal status: INITIAL  2.  Pt will demonstrate at least 10-130 degrees of active elbow ROM in order to demonstrate improved tolerance to daily tasks. Baseline: see ROM chart above Goal status: INITIAL  3. Pt will demonstrate LUE grip strength within at least 10# of contralateral limb in order to facilitate improved tolerance to functional tasks.   Baseline: see ROM chart above  Goal status: INITIAL  4. Pt will be able to lift up to 8# with LUE with less than 3 pt increase in pain in order to facilitate improved tolerance to household tasks such as cooking and cleaning.  Baseline: NT, pt endorses difficulty lifting coffee cups and skillets  Goal status: INITIAL    PLAN:  PT FREQUENCY: 2x/week  PT DURATION: 8 weeks  PLANNED INTERVENTIONS: 97164- PT Re-evaluation, 97110-Therapeutic exercises, 97530- Therapeutic activity, 97112- Neuromuscular re-education, 97535- Self Care, 29562- Manual therapy, [phone]- Gait training, Patient/Family education, Balance training, Stair training, Taping, Dry Needling, Joint mobilization, Cryotherapy, and Moist heat  PLAN FOR NEXT SESSION: Review/update HEP PRN. Work on [employer] exercises as appropriate with emphasis on elbow/wrist mobility. Symptom modification strategies as indicated/appropriate.     Berta Minor PTA 06/06/2023 8:14 AM

## 2023-06-07 NOTE — Telephone Encounter (Signed)
I called and spoke with Dominic Bryant and and notified her that I do not have forms and she will refax forms to [name]'s attention and asked if I will call her back once received.

## 2023-06-07 NOTE — Telephone Encounter (Signed)
I called Dominic Bryant and notified her that form was received but unfortunately this form needs to be filled out by an orthopedic specialist due to requesting information for residual functional capacity per Rodman Pickle, NP. She understood and will reach out to case manager.

## 2023-06-11 ENCOUNTER — Telehealth: Payer: Self-pay

## 2023-06-11 ENCOUNTER — Ambulatory Visit: Payer: No Typology Code available for payment source | Attending: Orthopaedic Surgery | Admitting: Physical Therapy

## 2023-06-11 ENCOUNTER — Encounter: Payer: Self-pay | Admitting: Physical Therapy

## 2023-06-11 DIAGNOSIS — M25532 Pain in left wrist: Secondary | ICD-10-CM | POA: Diagnosis present

## 2023-06-11 DIAGNOSIS — M6281 Muscle weakness (generalized): Secondary | ICD-10-CM | POA: Diagnosis present

## 2023-06-11 DIAGNOSIS — M25622 Stiffness of left elbow, not elsewhere classified: Secondary | ICD-10-CM | POA: Diagnosis present

## 2023-06-11 DIAGNOSIS — M25522 Pain in left elbow: Secondary | ICD-10-CM | POA: Diagnosis present

## 2023-06-11 NOTE — Therapy (Signed)
OUTPATIENT PHYSICAL THERAPY PROGRESS NOTE + RECERTIFICATION   Patient Name: Dominic Bryant MRN: [id number] DOB:Mar 06, 1996, 27 y.o., male Today's Date: 06/11/2023   Progress Note Reporting Period 04/24/23 to 06/11/23  See note below for Objective Data and Assessment of Progress/Goals.      END OF SESSION:  PT End of Session - 06/11/23 1358     Visit Number 8    Number of Visits 17    Date for PT Re-Evaluation 07/09/23    Authorization Type [employer]    Authorization Time Period 12 visits 05/01/23-06/16/23    Authorization - Visit Number 7    Authorization - Number of Visits 12    PT Start Time 1400    PT Stop Time 1430    PT Time Calculation (min) 30 min    Activity Tolerance Patient tolerated treatment well                Past Medical History:  Diagnosis Date   Depression    Heart murmur    MDD (major depressive disorder)    Suicide attempt (HCC) 03/11/2023   Jumped off a bridge   Thyroid disease    Past Surgical History:  Procedure Laterality Date   ESOPHAGOGASTRODUODENOSCOPY (EGD) WITH PROPOFOL N/A 05/12/2015   Procedure: ESOPHAGOGASTRODUODENOSCOPY (EGD) WITH PROPOFOL;  Surgeon: Willis Modena, MD;  Location: [facility];  Service: Endoscopy;  Laterality: N/A;   EUS N/A 05/12/2015   Procedure: UPPER ENDOSCOPIC ULTRASOUND (EUS) RADIAL;  Surgeon: Willis Modena, MD;  Location: [facility];  Service: Endoscopy;  Laterality: N/A;   Patient Active Problem List   Diagnosis Date Noted   History of suicide attempt 05/03/2023   History of arm fracture 05/03/2023   Chronic bilateral low back pain without sciatica 05/03/2023   Tobacco use 05/03/2023   Class 2 severe obesity due to excess calories with serious comorbidity and body mass index (BMI) of 36.0 to 36.9 in adult [facility]) 10/09/2022   Major depressive disorder with single episode, in partial remission (HCC) 07/01/2020   Acquired hypothyroidism 07/01/2020    PCP: Etta Grandchild, MD  REFERRING  PROVIDER: Huel Cote, MD  REFERRING DIAG: S52.022A (ICD-10-CM) - Closed fracture of olecranon process of left ulna, initial encounter Referral comment: "ROM and strengthening as tolerated L elbow"  THERAPY DIAG:  Pain in left elbow  Pain in left wrist  Stiffness of left elbow, not elsewhere classified  Muscle weakness (generalized)  Rationale for Evaluation and Treatment: Rehabilitation  ONSET DATE: 03/11/23  SUBJECTIVE:  Per eval - Pt endorses fracture sustained in September 2024, was in a cast up until last week. He states that since removing cast he has had difficulty with straightening and bending arm, as well as lifting items. Also endorses weakness in L arm, mild wrist pain at times. He denies any N/T, endorses stiffness as primary issue.  He also reports having a compression fracture in low back - states he was initially wearing a brace but has since been cleared from that. Unsure if he still has restrictions but does endorse some hip/low back stiffness and pain at times, particularly with bed mobility.  Hand dominance: Right  SUBJECTIVE STATEMENT: Pt does endorse some increase in elbow pain with lifting heavy furniture around the house but overall improving. No longer having distal symptoms or N/T. Feels he is able to use his arm better. Pt does mention back seems to be getting more painful, especially when waking up or when sitting for a while. States he sees physician at the end of the month. Denies any bowel/bladder issues or saddle anesthesia. Does endorse some LE numbness at times, mostly on R side.     PERTINENT HISTORY: PMH: depression, suicide attempt 03/11/23  PAIN:  Are you having pain: 1/10 in elbow  Best-worst over past week: 1-10/10 (when moving arm a lot, has been moving furniture)    Per eval -  Location/description: L elbow Best-worst over past week: 0-6/10  - aggravating factors: putting on socks, straightening arm, picking up objects (coffee cup, skillet) - Easing factors: cessation of provocative activity    PRECAUTIONS: L elbow fracture (ROM/strengthening per referral), lumbar compression fracture   WEIGHT BEARING RESTRICTIONS: No  FALLS:  Has patient fallen in last 6 months? No  LIVING ENVIRONMENT: Lives in 1 level home + basement; limited stair navigation in home Lives with mother, her husband, and his sister Housework split between everyone, pt does a bit of cleaning and housework    OCCUPATION: Not working currently - enjoys movies, being on phone  PLOF: Independent  PATIENT GOALS: be able to pick stuff up more  NEXT MD VISIT: 06/25/23 with Dr. Lajoyce Corners, Dr. Steward Drone Jan/feb per pt report  OBJECTIVE:  Note: Objective measures were completed at Evaluation unless otherwise noted.  DIAGNOSTIC FINDINGS:  See EPIC for details  PATIENT SURVEYS:  FOTO 51 current, 50 predicted FOTO 05/17/23 53 FOTO 06/11/23: 57   COGNITION: Overall cognitive status: Within functional limits for tasks assessed     SENSATION: Denies sensory complaints   UPPER EXTREMITY ROM:   ROM Right eval Left eval Left 05/01/23 Left 05/03/23 Left 05/17/23 Left 06/11/23  Shoulder flexion        Shoulder extension        Shoulder abduction        Shoulder adduction        Shoulder internal rotation        Shoulder external rotation        Elbow flexion 130 deg 125 deg *  131 deg painless     Elbow extension Lacking 20 deg, painless Lacking 56 deg * Lacking 30 deg * Lacking 28 deg Lacking 25  degrees actively, 24 deg passively with pain Lacking 30 deg actively painless, 25 deg passively without pain   Wrist flexion 80 58 *    60 deg painless  Wrist extension        Wrist ulnar deviation symmetrical Symmetrical *    Symmetrical and painless  Wrist radial deviation  Symmetrical  Symmetrical *  Symmetrical and painless  Wrist pronation 82 deg 80 deg    Symmetrical and painless  Wrist supination 85 deg 85 deg *    Symmetrical and painless   (Blank rows = not tested) (Key: WFL = within functional limits not formally assessed, * = concordant pain, s = stiffness/stretching sensation, NT = not tested)  Comment:   UPPER EXTREMITY MMT:  MMT Right eval Left eval Right 06/11/23 Left 06/11/23  Shoulder flexion      Shoulder extension      Shoulder abduction      Shoulder adduction      Shoulder internal rotation      Shoulder external rotation      Middle trapezius      Lower trapezius      Elbow flexion      Elbow extension      Wrist flexion      Wrist extension      Wrist ulnar deviation      Wrist radial deviation      Wrist pronation      Wrist supination      Grip strength (lbs) 55# 15# 55#  30#   (Blank rows = not tested) (Key: WFL = within functional limits not formally assessed, * = concordant pain, s = stiffness/stretching sensation, NT = not tested)  Comment:   FUNCTIONAL TESTING: 06/11/23: - 2# bicep curl x8 with initial pain that improves with repetition; performed seated. Deferred functional lifting assessment given increase in back pain recently   06/11/23: Screening for Suicide  Answer the following questions with Yes or No and place an "x" beside the action taken.  1. Over the past two weeks, have you felt down, depressed, or hopeless?  Yes   2. Within the past two weeks, have you felt little interest or pleasure in life?  Not asked  If YES to either #1 or #2, then ask #3  3. Have you had thoughts that that life is not worth living or that you might be       better off dead?   Not asked, moved directly to question 4 If answer is NO and suspicion is low, then end  4. Over this past week, have you had any thoughts about hurting or even killing yourself?  Yes  If NO, then end. Patient in no immediate danger  5. If so,  do you believe that you intend to or will harm yourself?  Pt unsure    If NO, then end. Patient in no immediate danger  6.  Do you have a plan as to how you would hurt yourself?  No   7.  Over this past week, have you actually done anything to hurt yourself? No   IF YES answers to either #4, #5, #6 or #7, then patient is AT RISK for suicide Actions Taken ____  Screening negative; no further action required ____  Screening positive; no immediate danger and patient already in treatment with a  mental health provider. Advise patient to speak to their mental health provider. ___ X Screening positive; no immediate danger. Patient advised to contact a mental  Health provider for further assessment.  ____  Screening positive; in immediate danger as patient states intention of killing self,  has plan and a sense of imminence. Do not leave alone. Seek permission from  patient to contact a family member to inform them. Direct patient to go to ED.     TODAY'S TREATMENT:  [facility] Adult PT Treatment:                                                DATE: 06/11/23 Therapeutic Activity: FOTO + education MSK assessment + education Lifting assessment (2# seated due to back pain) + education Education/discussion re: progress with PT, symptom behavior as it affects activity tolerance, PT goals/POC  Also discussed pt report of mental health concerns - encouraged to reach out to PCP, provided with [place] [facility] resource sheet  PATIENT EDUCATION: Education details: rationale for interventions, HEP, progress note activities Person educated: Patient Education method: Programmer, multimedia, Demonstration, Tactile cues, Verbal cues Education comprehension: verbalized understanding, returned demonstration, verbal cues required, tactile cues required, and needs further education    HOME EXERCISE PROGRAM: Access Code: [email or web address]  ASSESSMENT:  CLINICAL IMPRESSION: Pt arrived w/ 1/10 elbow pain,  endorsed some worsening of back pain - no red flag symptoms today, education on close monitoring with appropriate action should they occur, encouraged follow up with physician at end of the month. Goals assessed as below, pt with improvements in ROM and strength compared to start of care although remains limited. Lifting tolerance also limited. At this point recommend extension of POC to continue addressing strength/ROM and maximizing functional tolerance.  Of note - at end of session pt did endorse worsening of depressive symptoms. Pt did endorse positive answers to suicide screen but denied any acute safety concerns; he stated he was unsure of any possibility of self harm in future but no plan or urgency in place at this time, has not engaged in any acts of self harm. In discussion with pt, he stated he has not yet been able to get in touch with counseling services. Encouraged him to reach out to PCP to discuss, he also gave permission for this writer to reach out to his PCP Rodman Pickle NP) to discuss today's session. She was notified via secure chat, appreciate her assistance and response. Also provided pt with [facility] resource sheet. Pt requests deferring of exercise today, which is respected. Pt departs today's session in no acute distress.     Per eval - Patient is a 27 y.o. gentleman who was seen today for physical therapy evaluation and treatment for elbow pain s/p fracture September 2024. He notes stiffness and weakness as primary limiting factors for household/daily tasks. On exam he demonstrates mobility limitations at elbow and wrist, as well as reduced grip strength. Tolerates exam and HEP well without any increase in resting pain, no adverse events. Recommend skilled PT to address aforementioned deficits with aim of improving functional tolerance and reducing pain with typical activities. Pt departs today's session in no acute distress, all voiced concerns/questions  addressed appropriately from PT perspective.      OBJECTIVE IMPAIRMENTS: decreased activity tolerance, decreased endurance, decreased mobility, difficulty walking, decreased ROM, decreased strength, impaired UE functional use, and pain.   ACTIVITY LIMITATIONS: carrying, lifting, and dressing  PARTICIPATION LIMITATIONS: meal prep, cleaning, laundry, and community activity  PERSONAL FACTORS: Time since onset of injury/illness/exacerbation and 1 comorbidity: depression  are also affecting patient's functional outcome.   REHAB POTENTIAL: Good  CLINICAL DECISION MAKING: Stable/uncomplicated  EVALUATION COMPLEXITY: Low   GOALS: Goals reviewed with patient? No  SHORT TERM GOALS: Target date: 05/22/2023 Pt will demonstrate appropriate understanding and performance of initially prescribed HEP in order  to facilitate improved independence with management of symptoms.  Baseline: HEP provided on eval 06/11/23: reports fair HEP performance Goal status: MET  2. Pt will score greater than or equal to 60 on FOTO in order to demonstrate improved perception of function due to symptoms.  Baseline: 51 06/11/23: 57 Goal status: ONGOING  LONG TERM GOALS: Target date: 07/09/2023 (updated 06/11/23) Pt will score 68 on FOTO in order to demonstrate improved perception of function due to symptoms. Baseline: 51 06/11/23: 57 Goal status: ONGOING  2.  Pt will demonstrate at least 10-130 degrees of active elbow ROM in order to demonstrate improved tolerance to daily tasks. Baseline: see ROM chart above 06/11/23: see ROM chart above Goal status: ONGOING  3. Pt will demonstrate LUE grip strength within at least 10# of contralateral limb in order to facilitate improved tolerance to functional tasks.   Baseline: see ROM chart above 06/11/23: see MMT chart above Goal status: PROGRESSING  4. Pt will be able to lift up to 8# with LUE with less than 3 pt increase in pain in order to facilitate improved  tolerance to household tasks such as cooking and cleaning.  Baseline: NT, pt endorses difficulty lifting coffee cups and skillets 06/11/23: 2# bicep curl with initial pain  Goal status: ONGOING   PLAN: UPDATED 06/11/23  PT FREQUENCY: 2x/week    PT DURATION: 4 weeks  PLANNED INTERVENTIONS: 97164- PT Re-evaluation, 97110-Therapeutic exercises, 97530- Therapeutic activity, 97112- Neuromuscular re-education, 97535- Self Care, 13244- Manual therapy, [phone]- Gait training, Patient/Family education, Balance training, Stair training, Taping, Dry Needling, Joint mobilization, Cryotherapy, and Moist heat  PLAN FOR NEXT SESSION: Review/update HEP PRN. Work on [employer] exercises as appropriate with emphasis on elbow/wrist mobility. Symptom modification strategies as indicated/appropriate.     Ashley Murrain PT, DPT 06/11/2023 5:43 PM

## 2023-06-11 NOTE — Telephone Encounter (Signed)
I clled and spoke with patient and I scheduled him for an appointment tomorrow morning at 820am

## 2023-06-12 ENCOUNTER — Telehealth: Payer: Self-pay | Admitting: Nurse Practitioner

## 2023-06-12 ENCOUNTER — Ambulatory Visit: Payer: No Typology Code available for payment source | Admitting: Nurse Practitioner

## 2023-06-12 ENCOUNTER — Telehealth: Payer: Self-pay

## 2023-06-12 NOTE — Telephone Encounter (Signed)
I called and spoke with patient and asked him if he could be seen in office this Thursday at 1120 or 4pm. Patient said he would like the 4pm appointment. I called and asked Almira Coaster to schedule appointment for patient at 4pm.

## 2023-06-12 NOTE — Telephone Encounter (Signed)
I called patient and no answer and no voicemail. I will try to call again later.

## 2023-06-12 NOTE — Telephone Encounter (Signed)
I called patient because he missed appointment this morning. I asked patient if he could come in the office today to be seen and he said that he could not and next available day he could come would be this Thursday. I told Dominic Bryant that I will ask [name] and call him back.

## 2023-06-12 NOTE — Telephone Encounter (Signed)
1st missed visit, letter sent via mychart, pt has rescheduled for 12/19

## 2023-06-12 NOTE — Telephone Encounter (Signed)
Noted  

## 2023-06-12 NOTE — Telephone Encounter (Signed)
12.17.24 no show

## 2023-06-13 ENCOUNTER — Ambulatory Visit: Payer: No Typology Code available for payment source

## 2023-06-13 DIAGNOSIS — M25522 Pain in left elbow: Secondary | ICD-10-CM

## 2023-06-13 DIAGNOSIS — M25622 Stiffness of left elbow, not elsewhere classified: Secondary | ICD-10-CM

## 2023-06-13 DIAGNOSIS — M25532 Pain in left wrist: Secondary | ICD-10-CM

## 2023-06-13 DIAGNOSIS — M6281 Muscle weakness (generalized): Secondary | ICD-10-CM

## 2023-06-13 NOTE — Therapy (Signed)
OUTPATIENT PHYSICAL THERAPY TREATMENT NOTE   Patient Name: Dominic Bryant MRN: [id number] DOB:January 08, 1996, 27 y.o., male Today's Date: 06/13/2023     END OF SESSION:  PT End of Session - 06/13/23 1356     Visit Number 9    Number of Visits 17    Date for PT Re-Evaluation 07/09/23    Authorization Type [employer]    Authorization Time Period 12 visits 05/01/23-06/16/23    Authorization - Visit Number 8    Authorization - Number of Visits 12    PT Start Time 1400    PT Stop Time 1438    PT Time Calculation (min) 38 min    Activity Tolerance Patient tolerated treatment well    Behavior During Therapy [facility] for tasks assessed/performed             Past Medical History:  Diagnosis Date   Depression    Heart murmur    MDD (major depressive disorder)    Suicide attempt (HCC) 03/11/2023   Jumped off a bridge   Thyroid disease    Past Surgical History:  Procedure Laterality Date   ESOPHAGOGASTRODUODENOSCOPY (EGD) WITH PROPOFOL N/A 05/12/2015   Procedure: ESOPHAGOGASTRODUODENOSCOPY (EGD) WITH PROPOFOL;  Surgeon: Willis Modena, MD;  Location: [facility];  Service: Endoscopy;  Laterality: N/A;   EUS N/A 05/12/2015   Procedure: UPPER ENDOSCOPIC ULTRASOUND (EUS) RADIAL;  Surgeon: Willis Modena, MD;  Location: [facility];  Service: Endoscopy;  Laterality: N/A;   Patient Active Problem List   Diagnosis Date Noted   History of suicide attempt 05/03/2023   History of arm fracture 05/03/2023   Chronic bilateral low back pain without sciatica 05/03/2023   Tobacco use 05/03/2023   Class 2 severe obesity due to excess calories with serious comorbidity and body mass index (BMI) of 36.0 to 36.9 in adult [facility]) 10/09/2022   Major depressive disorder with single episode, in partial remission (HCC) 07/01/2020   Acquired hypothyroidism 07/01/2020    PCP: Etta Grandchild, MD  REFERRING PROVIDER: Huel Cote, MD  REFERRING DIAG: S52.022A (ICD-10-CM) - Closed fracture of  olecranon process of left ulna, initial encounter Referral comment: "ROM and strengthening as tolerated L elbow"  THERAPY DIAG:  Pain in left elbow  Pain in left wrist  Stiffness of left elbow, not elsewhere classified  Muscle weakness (generalized)  Rationale for Evaluation and Treatment: Rehabilitation  ONSET DATE: 03/11/23  SUBJECTIVE:                                                                                                                                                                                     Per eval - Pt endorses fracture sustained in  September 2024, was in a cast up until last week. He states that since removing cast he has had difficulty with straightening and bending arm, as well as lifting items. Also endorses weakness in L arm, mild wrist pain at times. He denies any N/T, endorses stiffness as primary issue.  He also reports having a compression fracture in low back - states he was initially wearing a brace but has since been cleared from that. Unsure if he still has restrictions but does endorse some hip/low back stiffness and pain at times, particularly with bed mobility.  Hand dominance: Right  SUBJECTIVE STATEMENT: Patient reports no current elbow pain, continued lower back pain.    PERTINENT HISTORY: PMH: depression, suicide attempt 03/11/23  PAIN:  Are you having pain: 1/10 in elbow  Best-worst over past week: 1-10/10 (when moving arm a lot, has been moving furniture)   Per eval -  Location/description: L elbow Best-worst over past week: 0-6/10  - aggravating factors: putting on socks, straightening arm, picking up objects (coffee cup, skillet) - Easing factors: cessation of provocative activity    PRECAUTIONS: L elbow fracture (ROM/strengthening per referral), lumbar compression fracture   WEIGHT BEARING RESTRICTIONS: No  FALLS:  Has patient fallen in last 6 months? No  LIVING ENVIRONMENT: Lives in 1 level home + basement; limited  stair navigation in home Lives with mother, her husband, and his sister Housework split between everyone, pt does a bit of cleaning and housework    OCCUPATION: Not working currently - enjoys movies, being on phone  PLOF: Independent  PATIENT GOALS: be able to pick stuff up more  NEXT MD VISIT: 06/25/23 with Dr. Lajoyce Corners, Dr. Steward Drone Jan/feb per pt report  OBJECTIVE:  Note: Objective measures were completed at Evaluation unless otherwise noted.  DIAGNOSTIC FINDINGS:  See EPIC for details  PATIENT SURVEYS:  FOTO 51 current, 62 predicted FOTO 05/17/23 53 FOTO 06/11/23: 57   COGNITION: Overall cognitive status: Within functional limits for tasks assessed     SENSATION: Denies sensory complaints   UPPER EXTREMITY ROM:   ROM Right eval Left eval Left 05/01/23 Left 05/03/23 Left 05/17/23 Left 06/11/23  Shoulder flexion        Shoulder extension        Shoulder abduction        Shoulder adduction        Shoulder internal rotation        Shoulder external rotation        Elbow flexion 130 deg 125 deg *  131 deg painless     Elbow extension Lacking 20 deg, painless Lacking 56 deg * Lacking 30 deg * Lacking 28 deg Lacking 25  degrees actively, 24 deg passively with pain Lacking 30 deg actively painless, 25 deg passively without pain   Wrist flexion 80 58 *    60 deg painless  Wrist extension        Wrist ulnar deviation symmetrical Symmetrical *    Symmetrical and painless  Wrist radial deviation Symmetrical  Symmetrical *    Symmetrical and painless  Wrist pronation 82 deg 80 deg    Symmetrical and painless  Wrist supination 85 deg 85 deg *    Symmetrical and painless   (Blank rows = not tested) (Key: WFL = within functional limits not formally assessed, * = concordant pain, s = stiffness/stretching sensation, NT = not tested)  Comment:   UPPER EXTREMITY MMT:  MMT Right eval Left eval Right 06/11/23 Left 06/11/23  Shoulder flexion  Shoulder extension      Shoulder  abduction      Shoulder adduction      Shoulder internal rotation      Shoulder external rotation      Middle trapezius      Lower trapezius      Elbow flexion      Elbow extension      Wrist flexion      Wrist extension      Wrist ulnar deviation      Wrist radial deviation      Wrist pronation      Wrist supination      Grip strength (lbs) 55# 15# 55#  30#   (Blank rows = not tested) (Key: WFL = within functional limits not formally assessed, * = concordant pain, s = stiffness/stretching sensation, NT = not tested)  Comment:   FUNCTIONAL TESTING: 06/11/23: - 2# bicep curl x8 with initial pain that improves with repetition; performed seated. Deferred functional lifting assessment given increase in back pain recently   06/11/23: Screening for Suicide  Answer the following questions with Yes or No and place an "x" beside the action taken.  1. Over the past two weeks, have you felt down, depressed, or hopeless?  Yes   2. Within the past two weeks, have you felt little interest or pleasure in life?  Not asked  If YES to either #1 or #2, then ask #3  3. Have you had thoughts that that life is not worth living or that you might be       better off dead?   Not asked, moved directly to question 4 If answer is NO and suspicion is low, then end  4. Over this past week, have you had any thoughts about hurting or even killing yourself?  Yes  If NO, then end. Patient in no immediate danger  5. If so, do you believe that you intend to or will harm yourself?  Pt unsure    If NO, then end. Patient in no immediate danger  6.  Do you have a plan as to how you would hurt yourself?  No   7.  Over this past week, have you actually done anything to hurt yourself? No   IF YES answers to either #4, #5, #6 or #7, then patient is AT RISK for suicide Actions Taken ____  Screening negative; no further action required ____  Screening positive; no immediate danger and patient already in  treatment with a  mental health provider. Advise patient to speak to their mental health provider. ___ X Screening positive; no immediate danger. Patient advised to contact a mental  Health provider for further assessment.  ____  Screening positive; in immediate danger as patient states intention of killing self,  has plan and a sense of imminence. Do not leave alone. Seek permission from  patient to contact a family member to inform them. Direct patient to go to ED.     TODAY'S TREATMENT:     [facility] Adult PT Treatment:                                                DATE: 06/13/23 Therapeutic Exercise: Green band row 2x12 cues for form Red band shoulder ext 2x10 (no pain) Elbow flexion/extension AROM with shoulder at 90 deg 2x15 Elbow flex/ext AROM w/  shoulder at 120 deg 2x15 Lifting 1# into cabinet bottom/middle shelf 1# 2x10 LUE ER/IR YTB LUE 2x10 ea Seated OH press 1# 2x10 LUE   [facility] Adult PT Treatment:                                                DATE: 06/11/23 Therapeutic Activity: FOTO + education MSK assessment + education Lifting assessment (2# seated due to back pain) + education Education/discussion re: progress with PT, symptom behavior as it affects activity tolerance, PT goals/POC  Also discussed pt report of mental health concerns - encouraged to reach out to PCP, provided with [place] [facility] resource sheet  PATIENT EDUCATION: Education details: rationale for interventions, HEP, progress note activities Person educated: Patient Education method: Programmer, multimedia, Demonstration, Tactile cues, Verbal cues Education comprehension: verbalized understanding, returned demonstration, verbal cues required, tactile cues required, and needs further education    HOME EXERCISE PROGRAM: Access Code: [email or web address]  ASSESSMENT:  CLINICAL IMPRESSION: Patient presents to PT reporting no current pain in his elbow and that he is feeling overall better today, no worsening  of depressive symptoms. Session today continued to focus on UE strengthening and functional activities such as lifting weight into a cabinet. He endorses mild pain, up to 2/10, and tightness during session. Patient was able to tolerate all prescribed exercises with no adverse effects. Patient continues to benefit from skilled PT services and should be progressed as able to improve functional independence.    Per eval - Patient is a 27 y.o. gentleman who was seen today for physical therapy evaluation and treatment for elbow pain s/p fracture September 2024. He notes stiffness and weakness as primary limiting factors for household/daily tasks. On exam he demonstrates mobility limitations at elbow and wrist, as well as reduced grip strength. Tolerates exam and HEP well without any increase in resting pain, no adverse events. Recommend skilled PT to address aforementioned deficits with aim of improving functional tolerance and reducing pain with typical activities. Pt departs today's session in no acute distress, all voiced concerns/questions addressed appropriately from PT perspective.      OBJECTIVE IMPAIRMENTS: decreased activity tolerance, decreased endurance, decreased mobility, difficulty walking, decreased ROM, decreased strength, impaired UE functional use, and pain.   ACTIVITY LIMITATIONS: carrying, lifting, and dressing  PARTICIPATION LIMITATIONS: meal prep, cleaning, laundry, and community activity  PERSONAL FACTORS: Time since onset of injury/illness/exacerbation and 1 comorbidity: depression  are also affecting patient's functional outcome.   REHAB POTENTIAL: Good  CLINICAL DECISION MAKING: Stable/uncomplicated  EVALUATION COMPLEXITY: Low   GOALS: Goals reviewed with patient? No  SHORT TERM GOALS: Target date: 05/22/2023 Pt will demonstrate appropriate understanding and performance of initially prescribed HEP in order to facilitate improved independence with management of symptoms.   Baseline: HEP provided on eval 06/11/23: reports fair HEP performance Goal status: MET  2. Pt will score greater than or equal to 60 on FOTO in order to demonstrate improved perception of function due to symptoms.  Baseline: 51 06/11/23: 57 Goal status: ONGOING  LONG TERM GOALS: Target date: 07/09/2023 (updated 06/11/23) Pt will score 68 on FOTO in order to demonstrate improved perception of function due to symptoms. Baseline: 51 06/11/23: 57 Goal status: ONGOING  2.  Pt will demonstrate at least 10-130 degrees of active elbow ROM in order to demonstrate improved tolerance to daily tasks. Baseline: see ROM chart  above 06/11/23: see ROM chart above Goal status: ONGOING  3. Pt will demonstrate LUE grip strength within at least 10# of contralateral limb in order to facilitate improved tolerance to functional tasks.   Baseline: see ROM chart above 06/11/23: see MMT chart above Goal status: PROGRESSING  4. Pt will be able to lift up to 8# with LUE with less than 3 pt increase in pain in order to facilitate improved tolerance to household tasks such as cooking and cleaning.  Baseline: NT, pt endorses difficulty lifting coffee cups and skillets 06/11/23: 2# bicep curl with initial pain  Goal status: ONGOING   PLAN: UPDATED 06/11/23  PT FREQUENCY: 2x/week    PT DURATION: 4 weeks  PLANNED INTERVENTIONS: 97164- PT Re-evaluation, 97110-Therapeutic exercises, 97530- Therapeutic activity, 97112- Neuromuscular re-education, 97535- Self Care, 16109- Manual therapy, [phone]- Gait training, Patient/Family education, Balance training, Stair training, Taping, Dry Needling, Joint mobilization, Cryotherapy, and Moist heat  PLAN FOR NEXT SESSION: Review/update HEP PRN. Work on [employer] exercises as appropriate with emphasis on elbow/wrist mobility. Symptom modification strategies as indicated/appropriate.     Berta Minor PTA 06/13/2023 2:42 PM

## 2023-06-14 ENCOUNTER — Ambulatory Visit: Payer: No Typology Code available for payment source | Admitting: Nurse Practitioner

## 2023-06-14 VITALS — BP 102/60 | HR 73 | Ht 67.0 in | Wt 218.2 lb

## 2023-06-14 DIAGNOSIS — F324 Major depressive disorder, single episode, in partial remission: Secondary | ICD-10-CM | POA: Diagnosis not present

## 2023-06-14 DIAGNOSIS — R053 Chronic cough: Secondary | ICD-10-CM | POA: Diagnosis not present

## 2023-06-14 MED ORDER — QUETIAPINE FUMARATE ER 200 MG PO TB24: 200.0000 mg | ORAL_TABLET | Freq: Every day | ORAL | 1 refills | Status: DC

## 2023-06-14 MED ORDER — LEVOCETIRIZINE DIHYDROCHLORIDE 5 MG PO TABS: 5.0000 mg | ORAL_TABLET | Freq: Every evening | ORAL | 1 refills | Status: DC

## 2023-06-14 NOTE — Patient Instructions (Signed)
It was great to see you!  The [facility] is open 24/7 and is a walk-in urgent care  [facility] [address] [place], [place] [address] [phone]  I have placed a referral to psychiatry and pulmonology  Increase your Seroquel to 200mg  at bedtime.   Let's follow-up in 2 weeks, sooner if you have concerns.  If a referral was placed today, you will be contacted for an appointment. Please note that routine referrals can sometimes take up to 3-4 weeks to process. Please call our office if you haven't heard anything after this time frame.  Take care,  Rodman Pickle, NP

## 2023-06-14 NOTE — Progress Notes (Signed)
Established Patient Office Visit  Subjective   Patient ID: Dominic Bryant, male    DOB: July 25, 1995  Age: 27 y.o. MRN: [id number]  Chief Complaint  Patient presents with   Depression    Follow up, concerns with spitting up a lot of mucus for 2 months (red, yellow, black)    HPI  Discussed the use of AI scribe software for clinical note transcription with the patient, who gave verbal consent to proceed.  History of Present Illness   The patient, with a history of depression and chronic cough, presents with worsening depressive symptoms and persistent cough. He reports feeling 'down' and 'sad' with increased crying. The depressive symptoms have worsened over the past week, with no identifiable trigger. He has had suicidal ideation, including thoughts of jumping off a bridge, obtaining a firearm, self-harm, and hanging. Despite these thoughts, he has not acted on them and denies current suicidal ideation. He lives with his mother but has not disclosed his current mental state to her. He is currently taking Seroquel and Zoloft, but does not recall if he has a follow-up appointment with a psychiatrist.  The patient also reports a chronic cough with production of brownish-yellow mucus, sometimes with black dots, for the past couple of years. He describes occasional shortness of breath and a stuffy nose. He has tried Mucinex in the past with minimal relief. 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       06/14/2023    4:38 PM 05/03/2023    2:16 PM 10/09/2022    9:25 AM 07/03/2022    9:04 AM 10/13/2020   10:48 AM  Depression screen PHQ 2/9  Decreased Interest 1 3 1  0   Down, Depressed, Hopeless 3 3 2  0   PHQ - 2 Score 4 6 3  0   Altered sleeping 3 3 3 3    Tired, decreased energy 2 2 0 2   Change in appetite 2 1 2 1    Feeling bad or failure about yourself  3 3 1  0   Trouble concentrating 3 2 2  0   Moving slowly or fidgety/restless 2 2 0 0    Suicidal thoughts 3 3 2  0   PHQ-9 Score 22 22 13 6    Difficult doing work/chores Very difficult Very difficult Somewhat difficult       Information is confidential and restricted. Go to Review Flowsheets to unlock data.      06/14/2023    4:38 PM 05/03/2023    2:16 PM  GAD 7 : Generalized Anxiety Score  Nervous, Anxious, on Edge 3 2  Control/stop worrying 3 3  Worry too much - different things 3 3  Trouble relaxing 2 3  Restless 3 2  Easily annoyed or irritable 2 1  Afraid - awful might happen 3 2  Total GAD 7 Score 19 16  Anxiety Difficulty Extremely difficult Somewhat difficult      ROS See pertinent positives and negatives per HPI.    Objective:     BP 102/60 (BP Location: Left Arm, Patient Position: Sitting, Cuff Size: Normal)   Pulse 73   Ht 5\' 7"  (1.702 m)   Wt 218 lb 3.2 oz (99 kg)   SpO2 97%   BMI 34.17 kg/m    Physical Exam Vitals and nursing note reviewed.  Constitutional:      Appearance: Normal appearance.  HENT:     Head: Normocephalic.  Eyes:  Conjunctiva/sclera: Conjunctivae normal.  Cardiovascular:     Rate and Rhythm: Normal rate and regular rhythm.     Pulses: Normal pulses.     Heart sounds: Normal heart sounds.  Pulmonary:     Effort: Pulmonary effort is normal.     Breath sounds: Normal breath sounds.  Musculoskeletal:     Cervical back: Normal range of motion.  Skin:    General: Skin is warm.  Neurological:     General: No focal deficit present.     Mental Status: He is alert and oriented to person, place, and time.  Psychiatric:        Mood and Affect: Mood normal. Affect is flat.        Behavior: Behavior normal.        Thought Content: Thought content normal.        Judgment: Judgment normal.      Assessment & Plan:   Problem List Items Addressed This Visit       Other   Major depressive disorder with single episode, in partial remission (HCC) - Primary   Chronic, not controlled. He has experienced a recent  worsening of depressive symptoms, including suicidal ideation, though he has no active plan. He states that he had a bad weekend last week and is doing slightly better. He is currently taking Seroquel and Zoloft but has not established psychiatric follow-up. We will increase Seroquel to 200mg  at bedtime, continue zoloft 100mg  daily, and refer him to Psychiatry for further management. We will also provide information for Behavioral Health Urgent Care for immediate access if needed and follow up in 2 weeks.      Relevant Orders   Ambulatory referral to Psychiatry   Chronic cough   He has had a chronic cough with mucus production for a couple of years, without shortness of breath or fever, and has a history of working in dusty environments. His chest X-ray in September was normal. We will refer him to Pulmonology for further evaluation and prescribe levocetirizine 5mg  daily to try daily for symptom management.       Relevant Orders   Ambulatory referral to Pulmonology    Return in about 2 weeks (around 06/28/2023) for Depression.    Gerre Scull, NP

## 2023-06-15 DIAGNOSIS — R053 Chronic cough: Secondary | ICD-10-CM | POA: Insufficient documentation

## 2023-06-15 NOTE — Assessment & Plan Note (Signed)
He has had a chronic cough with mucus production for a couple of years, without shortness of breath or fever, and has a history of working in dusty environments. His chest X-ray in September was normal. We will refer him to Pulmonology for further evaluation and prescribe levocetirizine 5mg  daily to try daily for symptom management.

## 2023-06-15 NOTE — Assessment & Plan Note (Addendum)
Chronic, not controlled. He has experienced a recent worsening of depressive symptoms, including suicidal ideation, though he has no active plan. He states that he had a bad weekend last week and is doing slightly better. He is currently taking Seroquel and Zoloft but has not established psychiatric follow-up. We will increase Seroquel to 200mg  at bedtime, continue zoloft 100mg  daily, and refer him to Psychiatry for further management. We will also provide information for Behavioral Health Urgent Care for immediate access if needed and follow up in 2 weeks.

## 2023-06-18 ENCOUNTER — Ambulatory Visit: Payer: No Typology Code available for payment source

## 2023-06-18 DIAGNOSIS — M25622 Stiffness of left elbow, not elsewhere classified: Secondary | ICD-10-CM

## 2023-06-18 DIAGNOSIS — M25522 Pain in left elbow: Secondary | ICD-10-CM | POA: Diagnosis not present

## 2023-06-18 DIAGNOSIS — M6281 Muscle weakness (generalized): Secondary | ICD-10-CM

## 2023-06-18 DIAGNOSIS — M25532 Pain in left wrist: Secondary | ICD-10-CM

## 2023-06-18 NOTE — Therapy (Signed)
OUTPATIENT PHYSICAL THERAPY TREATMENT NOTE   Patient Name: Dominic Bryant MRN: [id number] DOB:[birth year], 27 y.o., male Today's Date: 06/18/2023     END OF SESSION:  PT End of Session - 06/18/23 1348     Visit Number 10    Number of Visits 17    Date for PT Re-Evaluation 07/09/23    Authorization Type [employer]    Authorization Time Period 12 visits 05/01/23-06/16/23    Authorization - Visit Number 9    Authorization - Number of Visits 12    PT Start Time 1400    PT Stop Time 1440    PT Time Calculation (min) 40 min    Activity Tolerance Patient tolerated treatment well    Behavior During Therapy [facility] for tasks assessed/performed              Past Medical History:  Diagnosis Date   Depression    Heart murmur    MDD (major depressive disorder)    Suicide attempt (HCC) 03/11/2023   Jumped off a bridge   Thyroid disease    Past Surgical History:  Procedure Laterality Date   ESOPHAGOGASTRODUODENOSCOPY (EGD) WITH PROPOFOL N/A 05/12/2015   Procedure: ESOPHAGOGASTRODUODENOSCOPY (EGD) WITH PROPOFOL;  Surgeon: Willis Modena, MD;  Location: [facility];  Service: Endoscopy;  Laterality: N/A;   EUS N/A 05/12/2015   Procedure: UPPER ENDOSCOPIC ULTRASOUND (EUS) RADIAL;  Surgeon: Willis Modena, MD;  Location: [facility];  Service: Endoscopy;  Laterality: N/A;   Patient Active Problem List   Diagnosis Date Noted   Chronic cough 06/15/2023   History of suicide attempt 05/03/2023   History of arm fracture 05/03/2023   Chronic bilateral low back pain without sciatica 05/03/2023   Tobacco use 05/03/2023   Class 2 severe obesity due to excess calories with serious comorbidity and body mass index (BMI) of 36.0 to 36.9 in adult [facility]) 10/09/2022   Major depressive disorder with single episode, in partial remission (HCC) 07/01/2020   Acquired hypothyroidism 07/01/2020    PCP: Etta Grandchild, MD  REFERRING PROVIDER: Huel Cote, MD  REFERRING DIAG: S52.022A  (ICD-10-CM) - Closed fracture of olecranon process of left ulna, initial encounter Referral comment: "ROM and strengthening as tolerated L elbow"  THERAPY DIAG:  Pain in left elbow  Pain in left wrist  Stiffness of left elbow, not elsewhere classified  Muscle weakness (generalized)  Rationale for Evaluation and Treatment: Rehabilitation  ONSET DATE: 03/11/23  SUBJECTIVE:                                                                                                                                                                                     Per eval -  Pt endorses fracture sustained in September 2024, was in a cast up until last week. He states that since removing cast he has had difficulty with straightening and bending arm, as well as lifting items. Also endorses weakness in L arm, mild wrist pain at times. He denies any N/T, endorses stiffness as primary issue.  He also reports having a compression fracture in low back - states he was initially wearing a brace but has since been cleared from that. Unsure if he still has restrictions but does endorse some hip/low back stiffness and pain at times, particularly with bed mobility.  Hand dominance: Right  SUBJECTIVE STATEMENT: Patient reports no current elbow pain, has some back pain, is overall fatigued today.    PERTINENT HISTORY: PMH: depression, suicide attempt 03/11/23  PAIN:  Are you having pain: 1/10 in elbow  Best-worst over past week: 1-10/10 (when moving arm a lot, has been moving furniture)   Per eval -  Location/description: L elbow Best-worst over past week: 0-6/10  - aggravating factors: putting on socks, straightening arm, picking up objects (coffee cup, skillet) - Easing factors: cessation of provocative activity    PRECAUTIONS: L elbow fracture (ROM/strengthening per referral), lumbar compression fracture   WEIGHT BEARING RESTRICTIONS: No  FALLS:  Has patient fallen in last 6 months? No  LIVING  ENVIRONMENT: Lives in 1 level home + basement; limited stair navigation in home Lives with mother, her husband, and his sister Housework split between everyone, pt does a bit of cleaning and housework    OCCUPATION: Not working currently - enjoys movies, being on phone  PLOF: Independent  PATIENT GOALS: be able to pick stuff up more  NEXT MD VISIT: 06/25/23 with Dr. Lajoyce Corners, Dr. Steward Drone Jan/feb per pt report  OBJECTIVE:  Note: Objective measures were completed at Evaluation unless otherwise noted.  DIAGNOSTIC FINDINGS:  See EPIC for details  PATIENT SURVEYS:  FOTO 51 current, 64 predicted FOTO 05/17/23 53 FOTO 06/11/23: 57   COGNITION: Overall cognitive status: Within functional limits for tasks assessed     SENSATION: Denies sensory complaints   UPPER EXTREMITY ROM:   ROM Right eval Left eval Left 05/01/23 Left 05/03/23 Left 05/17/23 Left 06/11/23  Shoulder flexion        Shoulder extension        Shoulder abduction        Shoulder adduction        Shoulder internal rotation        Shoulder external rotation        Elbow flexion 130 deg 125 deg *  131 deg painless     Elbow extension Lacking 20 deg, painless Lacking 56 deg * Lacking 30 deg * Lacking 28 deg Lacking 25  degrees actively, 24 deg passively with pain Lacking 30 deg actively painless, 25 deg passively without pain   Wrist flexion 80 58 *    60 deg painless  Wrist extension        Wrist ulnar deviation symmetrical Symmetrical *    Symmetrical and painless  Wrist radial deviation Symmetrical  Symmetrical *    Symmetrical and painless  Wrist pronation 82 deg 80 deg    Symmetrical and painless  Wrist supination 85 deg 85 deg *    Symmetrical and painless   (Blank rows = not tested) (Key: WFL = within functional limits not formally assessed, * = concordant pain, s = stiffness/stretching sensation, NT = not tested)  Comment:   UPPER EXTREMITY MMT:  MMT Right eval  Left eval Right 06/11/23 Left 06/11/23   Shoulder flexion      Shoulder extension      Shoulder abduction      Shoulder adduction      Shoulder internal rotation      Shoulder external rotation      Middle trapezius      Lower trapezius      Elbow flexion      Elbow extension      Wrist flexion      Wrist extension      Wrist ulnar deviation      Wrist radial deviation      Wrist pronation      Wrist supination      Grip strength (lbs) 55# 15# 55#  30#   (Blank rows = not tested) (Key: WFL = within functional limits not formally assessed, * = concordant pain, s = stiffness/stretching sensation, NT = not tested)  Comment:   FUNCTIONAL TESTING: 06/11/23: - 2# bicep curl x8 with initial pain that improves with repetition; performed seated. Deferred functional lifting assessment given increase in back pain recently   06/11/23: Screening for Suicide  Answer the following questions with Yes or No and place an "x" beside the action taken.  1. Over the past two weeks, have you felt down, depressed, or hopeless?  Yes   2. Within the past two weeks, have you felt little interest or pleasure in life?  Not asked  If YES to either #1 or #2, then ask #3  3. Have you had thoughts that that life is not worth living or that you might be       better off dead?   Not asked, moved directly to question 4 If answer is NO and suspicion is low, then end  4. Over this past week, have you had any thoughts about hurting or even killing yourself?  Yes  If NO, then end. Patient in no immediate danger  5. If so, do you believe that you intend to or will harm yourself?  Pt unsure    If NO, then end. Patient in no immediate danger  6.  Do you have a plan as to how you would hurt yourself?  No   7.  Over this past week, have you actually done anything to hurt yourself? No   IF YES answers to either #4, #5, #6 or #7, then patient is AT RISK for suicide Actions Taken ____  Screening negative; no further action required ____  Screening  positive; no immediate danger and patient already in treatment with a  mental health provider. Advise patient to speak to their mental health provider. ___ X Screening positive; no immediate danger. Patient advised to contact a mental  Health provider for further assessment.  ____  Screening positive; in immediate danger as patient states intention of killing self,  has plan and a sense of imminence. Do not leave alone. Seek permission from  patient to contact a family member to inform them. Direct patient to go to ED.     TODAY'S TREATMENT:     [facility] Adult PT Treatment:                                                DATE: 06/18/23 Therapeutic Exercise: UBE level 1 3'/3'  fwd/bwd Green band row 2x12 cues for form  Green band shoulder ext 2x10 (no pain) Red band bus drivers 5M84" Elbow flex/ext AROM w/ shoulder at 120 deg 2x15 Lifting 2# into cabinet bottom/middle shelf 2x10 LUE Seated BIL ER with scap retraction RTB 2x10 Seated OH press 2# 2x10 LUE   [facility] Adult PT Treatment:                                                DATE: 06/13/23 Therapeutic Exercise: Green band row 2x12 cues for form Red band shoulder ext 2x10 (no pain) Elbow flexion/extension AROM with shoulder at 90 deg 2x15 Elbow flex/ext AROM w/ shoulder at 120 deg 2x15 Lifting 1# into cabinet bottom/middle shelf 1# 2x10 LUE ER/IR YTB LUE 2x10 ea Seated OH press 1# 2x10 LUE   [facility] Adult PT Treatment:                                                DATE: 06/11/23 Therapeutic Activity: FOTO + education MSK assessment + education Lifting assessment (2# seated due to back pain) + education Education/discussion re: progress with PT, symptom behavior as it affects activity tolerance, PT goals/POC  Also discussed pt report of mental health concerns - encouraged to reach out to PCP, provided with [place] behavioral health resource sheet  PATIENT EDUCATION: Education details: rationale for interventions, HEP, progress  note activities Person educated: Patient Education method: Programmer, multimedia, Demonstration, Tactile cues, Verbal cues Education comprehension: verbalized understanding, returned demonstration, verbal cues required, tactile cues required, and needs further education    HOME EXERCISE PROGRAM: Access Code: [email or web address]  ASSESSMENT:  CLINICAL IMPRESSION: Patient presents to PT reporting no current pain in his elbow, but does endorse mild low back pain and overall fatigue today from not sleeping well. Introduced UBE today with no resistance to good effect, no increase in pain reported. He was able to tolerate increased resistance with bands and weights today with no increase in pain. Patient was able to tolerate all prescribed exercises with no adverse effects. Patient continues to benefit from skilled PT services and should be progressed as able to improve functional independence.     Per eval - Patient is a 27 y.o. gentleman who was seen today for physical therapy evaluation and treatment for elbow pain s/p fracture September 2024. He notes stiffness and weakness as primary limiting factors for household/daily tasks. On exam he demonstrates mobility limitations at elbow and wrist, as well as reduced grip strength. Tolerates exam and HEP well without any increase in resting pain, no adverse events. Recommend skilled PT to address aforementioned deficits with aim of improving functional tolerance and reducing pain with typical activities. Pt departs today's session in no acute distress, all voiced concerns/questions addressed appropriately from PT perspective.      OBJECTIVE IMPAIRMENTS: decreased activity tolerance, decreased endurance, decreased mobility, difficulty walking, decreased ROM, decreased strength, impaired UE functional use, and pain.   ACTIVITY LIMITATIONS: carrying, lifting, and dressing  PARTICIPATION LIMITATIONS: meal prep, cleaning, laundry, and community activity  PERSONAL FACTORS: Time  since onset of injury/illness/exacerbation and 1 comorbidity: depression  are also affecting patient's functional outcome.   REHAB POTENTIAL: Good  CLINICAL DECISION MAKING: Stable/uncomplicated  EVALUATION COMPLEXITY: Low   GOALS: Goals reviewed with patient? No  SHORT  TERM GOALS: Target date: 05/22/2023 Pt will demonstrate appropriate understanding and performance of initially prescribed HEP in order to facilitate improved independence with management of symptoms.  Baseline: HEP provided on eval 06/11/23: reports fair HEP performance Goal status: MET  2. Pt will score greater than or equal to 60 on FOTO in order to demonstrate improved perception of function due to symptoms.  Baseline: 51 06/11/23: 57 Goal status: ONGOING  LONG TERM GOALS: Target date: 07/09/2023 (updated 06/11/23) Pt will score 68 on FOTO in order to demonstrate improved perception of function due to symptoms. Baseline: 51 06/11/23: 57 Goal status: ONGOING  2.  Pt will demonstrate at least 10-130 degrees of active elbow ROM in order to demonstrate improved tolerance to daily tasks. Baseline: see ROM chart above 06/11/23: see ROM chart above Goal status: ONGOING  3. Pt will demonstrate LUE grip strength within at least 10# of contralateral limb in order to facilitate improved tolerance to functional tasks.   Baseline: see ROM chart above 06/11/23: see MMT chart above Goal status: PROGRESSING  4. Pt will be able to lift up to 8# with LUE with less than 3 pt increase in pain in order to facilitate improved tolerance to household tasks such as cooking and cleaning.  Baseline: NT, pt endorses difficulty lifting coffee cups and skillets 06/11/23: 2# bicep curl with initial pain  Goal status: ONGOING   PLAN: UPDATED 06/11/23  PT FREQUENCY: 2x/week    PT DURATION: 4 weeks  PLANNED INTERVENTIONS: 97164- PT Re-evaluation, 97110-Therapeutic exercises, 97530- Therapeutic activity, 97112- Neuromuscular  re-education, 97535- Self Care, 43329- Manual therapy, [phone]- Gait training, Patient/Family education, Balance training, Stair training, Taping, Dry Needling, Joint mobilization, Cryotherapy, and Moist heat  PLAN FOR NEXT SESSION: Review/update HEP PRN. Work on [employer] exercises as appropriate with emphasis on elbow/wrist mobility. Symptom modification strategies as indicated/appropriate.     Berta Minor PTA 06/18/2023 2:42 PM

## 2023-06-21 ENCOUNTER — Ambulatory Visit: Payer: No Typology Code available for payment source | Admitting: Physical Therapy

## 2023-06-21 ENCOUNTER — Telehealth: Payer: Self-pay | Admitting: Physical Therapy

## 2023-06-21 NOTE — Telephone Encounter (Signed)
Called pt re: this afternoon's missed appt - he states he is doing well, and would like to hold off on scheduling additional appts for now as his elbow is continuing to improve, would like to wait until he sees provider next week to address his back pain. He states he will give [place] a call next week to update [place]. Denies any questions/concerns at this time.

## 2023-06-25 ENCOUNTER — Encounter: Payer: Self-pay | Admitting: Orthopedic Surgery

## 2023-06-25 ENCOUNTER — Ambulatory Visit: Payer: No Typology Code available for payment source | Admitting: Orthopedic Surgery

## 2023-06-25 DIAGNOSIS — S32000A Wedge compression fracture of unspecified lumbar vertebra, initial encounter for closed fracture: Secondary | ICD-10-CM

## 2023-06-25 NOTE — Progress Notes (Signed)
Office Visit Note   Patient: Dominic Bryant           Date of Birth: Sep 01, 1995           MRN: [id number] Visit Date: 06/25/2023              Requested by: Gerre Scull, NP [address] [place],  [place] [address] PCP: Gerre Scull, NP  Chief Complaint  Patient presents with   Lower Back - Follow-up      HPI: Patient is a 27 year old gentleman who is seen in follow-up for L2 compression fracture.  Patient was last seen October 14.  Injury on September 15.  Patient states he still has pain in the lumbar spine without radicular symptoms.  Patient states he has pain worse after prolonged sitting.  Assessment & Plan: Visit Diagnoses:  1. Closed compression fracture of lumbosacral spine, initial encounter [facility])     Plan: Will set patient up with physical therapy.  Follow-Up Instructions: Return if symptoms worsen or fail to improve.   Ortho Exam  Patient is alert, oriented, no adenopathy, well-dressed, normal affect, normal respiratory effort. Examination patient has a normal gait.  No focal motor weakness in either lower extremity.  No radicular pain.  Previous radiographs shows a superior endplate compression fracture at L2.  Imaging: No results found. No images are attached to the encounter.  Labs: Lab Results  Component Value Date   HGBA1C 4.6 07/01/2020     Lab Results  Component Value Date   ALBUMIN 3.3 (L) 03/11/2023   ALBUMIN 4.0 10/09/2022   ALBUMIN 3.7 09/28/2020    Lab Results  Component Value Date   MG 1.7 03/11/2023   No results found for: "VD25OH"  No results found for: "PREALBUMIN"    Latest Ref Rng & Units 03/11/2023    6:52 PM 03/11/2023    6:50 PM 10/09/2022    9:46 AM  CBC EXTENDED  WBC 4.0 - 10.5 K/uL  12.9  5.3   RBC 4.22 - 5.81 MIL/uL  5.44  5.65   Hemoglobin 13.0 - 17.0 g/dL 19.1  47.8  29.5   HCT 39.0 - 52.0 % 51.0  49.8  50.9   Platelets 150 - 400 K/uL  308  189.0   NEUT# 1.4 - 7.7 K/uL   3.2   Lymph# 0.7 -  4.0 K/uL   1.5      There is no height or weight on file to calculate BMI.  Orders:  No orders of the defined types were placed in this encounter.  No orders of the defined types were placed in this encounter.    Procedures: No procedures performed  Clinical Data: No additional findings.  ROS:  All other systems negative, except as noted in the HPI. Review of Systems  Objective: Vital Signs: There were no vitals taken for this visit.  Specialty Comments:  No specialty comments available.  PMFS History: Patient Active Problem List   Diagnosis Date Noted   Chronic cough 06/15/2023   History of suicide attempt 05/03/2023   History of arm fracture 05/03/2023   Chronic bilateral low back pain without sciatica 05/03/2023   Tobacco use 05/03/2023   Class 2 severe obesity due to excess calories with serious comorbidity and body mass index (BMI) of 36.0 to 36.9 in adult [facility]) 10/09/2022   Major depressive disorder with single episode, in partial remission (HCC) 07/01/2020   Acquired hypothyroidism 07/01/2020   Past Medical History:  Diagnosis Date  Depression    Heart murmur    MDD (major depressive disorder)    Suicide attempt (HCC) 03/11/2023   Jumped off a bridge   Thyroid disease     Family History  Problem Relation Age of Onset   Crohn's disease Mother    Asthma Father    Alcohol abuse Father     Past Surgical History:  Procedure Laterality Date   ESOPHAGOGASTRODUODENOSCOPY (EGD) WITH PROPOFOL N/A 05/12/2015   Procedure: ESOPHAGOGASTRODUODENOSCOPY (EGD) WITH PROPOFOL;  Surgeon: Willis Modena, MD;  Location: [facility];  Service: Endoscopy;  Laterality: N/A;   EUS N/A 05/12/2015   Procedure: UPPER ENDOSCOPIC ULTRASOUND (EUS) RADIAL;  Surgeon: Willis Modena, MD;  Location: [facility];  Service: Endoscopy;  Laterality: N/A;   Social History   Occupational History   Not on file  Tobacco Use   Smoking status: Every Day    Current packs/day: 0.50     Average packs/day: 0.5 packs/day for 8.0 years (4.0 ttl pk-yrs)    Types: Cigarettes    Start date: 2017   Smokeless tobacco: Never  Vaping Use   Vaping status: Never Used  Substance and Sexual Activity   Alcohol use: Yes    Comment: social   Drug use: Yes    Types: Marijuana   Sexual activity: Yes    Partners: Female    Birth control/protection: Condom

## 2023-07-09 ENCOUNTER — Encounter: Payer: Self-pay | Admitting: Nurse Practitioner

## 2023-07-09 ENCOUNTER — Ambulatory Visit (INDEPENDENT_AMBULATORY_CARE_PROVIDER_SITE_OTHER): Payer: No Typology Code available for payment source | Admitting: Nurse Practitioner

## 2023-07-09 VITALS — BP 102/64 | HR 77 | Temp 98.3°F | Ht 67.0 in | Wt 225.6 lb

## 2023-07-09 DIAGNOSIS — F324 Major depressive disorder, single episode, in partial remission: Secondary | ICD-10-CM | POA: Diagnosis not present

## 2023-07-09 DIAGNOSIS — T148XXA Other injury of unspecified body region, initial encounter: Secondary | ICD-10-CM

## 2023-07-09 MED ORDER — SERTRALINE HCL 100 MG PO TABS: 200.0000 mg | ORAL_TABLET | Freq: Every day | ORAL | 0 refills | Status: DC

## 2023-07-09 NOTE — Assessment & Plan Note (Signed)
 Chronic, improving but not controlled. There is improvement with increased Seroquel , but suicidal ideation persists without a plan. A psychiatry consult is approved but not yet scheduled. Continue Zoloft  200mg  daily and seroquel  200mg  daily, will refill the prescription. Encourage scheduling an appointment with a psychiatrist. Follow up in six weeks. Seek immediate help at behavioral health urgent care if suicidal ideation intensifies or a plan develops.

## 2023-07-09 NOTE — Therapy (Deleted)
 OUTPATIENT PHYSICAL THERAPY THORACOLUMBAR EVALUATION   Patient Name: Dominic Bryant MRN: [id number] DOB:May 20, 1996, 28 y.o., male Today's Date: 07/09/2023  END OF SESSION:   Past Medical History:  Diagnosis Date   Depression    Heart murmur    MDD (major depressive disorder)    Suicide attempt (HCC) 03/11/2023   Jumped off a bridge   Thyroid  disease    Past Surgical History:  Procedure Laterality Date   ESOPHAGOGASTRODUODENOSCOPY (EGD) WITH PROPOFOL  N/A 05/12/2015   Procedure: ESOPHAGOGASTRODUODENOSCOPY (EGD) WITH PROPOFOL ;  Surgeon: Elsie Cree, MD;  Location: [facility];  Service: Endoscopy;  Laterality: N/A;   EUS N/A 05/12/2015   Procedure: UPPER ENDOSCOPIC ULTRASOUND (EUS) RADIAL;  Surgeon: Elsie Cree, MD;  Location: [facility];  Service: Endoscopy;  Laterality: N/A;   Patient Active Problem List   Diagnosis Date Noted   Chronic cough 06/15/2023   History of suicide attempt 05/03/2023   History of arm fracture 05/03/2023   Chronic bilateral low back pain without sciatica 05/03/2023   Tobacco use 05/03/2023   Class 2 severe obesity due to excess calories with serious comorbidity and body mass index (BMI) of 36.0 to 36.9 in adult [facility]) 10/09/2022   Major depressive disorder with single episode, in partial remission (HCC) 07/01/2020   Acquired hypothyroidism 07/01/2020    PCP: Nedra Tinnie LABOR, NP   REFERRING PROVIDER: Harden Jerona GAILS, MD  REFERRING DIAG: S32.000A (ICD-10-CM) - Closed compression fracture of lumbosacral spine, initial encounter [facility])  Rationale for Evaluation and Treatment: Rehabilitation  THERAPY DIAG:  No diagnosis found.  ONSET DATE: ***  SUBJECTIVE:                                                                                                                                                                                           SUBJECTIVE STATEMENT: ***  PERTINENT HISTORY:  Patient is a 28 year old gentleman who is seen in  follow-up for L2 compression fracture.  Patient was last seen October 14.  Injury on September 15.  Patient states he still has pain in the lumbar spine without radicular symptoms.  Patient states he has pain worse after prolonged sitting.   Assessment & Plan: Visit Diagnoses:  1. Closed compression fracture of lumbosacral spine, initial encounter [facility])       Plan: Will set patient up with physical therapy.   Follow-Up Instructions: Return if symptoms worsen or fail to improve.    Ortho Exam   Patient is alert, oriented, no adenopathy, well-dressed, normal affect, normal respiratory effort. Examination patient has a normal gait.  No focal motor weakness in either lower extremity.  No radicular pain.  Previous radiographs shows a superior endplate compression fracture at L2.  PAIN:  Are you having pain? {OPRCPAIN:27236}  PRECAUTIONS: Back  RED FLAGS: None   WEIGHT BEARING RESTRICTIONS: No  FALLS:  Has patient fallen in last 6 months? No  OCCUPATION: ***  PLOF: Independent  PATIENT GOALS: ***  NEXT MD VISIT: ***  OBJECTIVE:  Note: Objective measures were completed at Evaluation unless otherwise noted.  DIAGNOSTIC FINDINGS:  None recent  PATIENT SURVEYS:  FOTO ***    MUSCLE LENGTH: Hamstrings: Right *** deg; Left *** deg Debby test: Right *** deg; Left *** deg  POSTURE: {posture:25561}  PALPATION: ***  LUMBAR ROM:   AROM eval  Flexion   Extension   Right lateral flexion   Left lateral flexion   Right rotation   Left rotation    (Blank rows = not tested)  LOWER EXTREMITY ROM:     {AROM/PROM:27142}  Right eval Left eval  Hip flexion    Hip extension    Hip abduction    Hip adduction    Hip internal rotation    Hip external rotation    Knee flexion    Knee extension    Ankle dorsiflexion    Ankle plantarflexion    Ankle inversion    Ankle eversion     (Blank rows = not tested)  LOWER EXTREMITY MMT:    MMT Right eval Left eval  Hip  flexion    Hip extension    Hip abduction    Hip adduction    Hip internal rotation    Hip external rotation    Knee flexion    Knee extension    Ankle dorsiflexion    Ankle plantarflexion    Ankle inversion    Ankle eversion     (Blank rows = not tested)  LUMBAR SPECIAL TESTS:  Straight leg raise test: {pos/neg:25243}, Slump test: {pos/neg:25243}, and FABER test: {pos/neg:25243}  FUNCTIONAL TESTS:  30 seconds chair stand test  GAIT: Distance walked: 68ft x2 Assistive device utilized: None Level of assistance: Complete Independence Comments: ***  TREATMENT DATE: ***                                                                                                                                 PATIENT EDUCATION:  Education details: Discussed eval findings, rehab rationale and POC and patient is in agreement  Person educated: Patient Education method: Explanation Education comprehension: verbalized understanding and needs further education  HOME EXERCISE PROGRAM: ***  ASSESSMENT:  CLINICAL IMPRESSION: Patient is a *** y.o. *** who was seen today for physical therapy evaluation and treatment for ***.   OBJECTIVE IMPAIRMENTS: {opptimpairments:25111}.   ACTIVITY LIMITATIONS: {activitylimitations:27494}  PERSONAL FACTORS: {Personal factors:25162} are also affecting patient's functional outcome.   REHAB POTENTIAL: Good  CLINICAL DECISION MAKING: Stable/uncomplicated  EVALUATION COMPLEXITY: Low   GOALS: Goals reviewed with patient? No  SHORT TERM GOALS: Target date: ***  Patient to demonstrate  independence in HEP  Baseline: Goal status: INITIAL  2.  *** Baseline:  Goal status: INITIAL  3.  *** Baseline:  Goal status: INITIAL  4.  *** Baseline:  Goal status: INITIAL  5.  *** Baseline:  Goal status: INITIAL  6.  *** Baseline:  Goal status: INITIAL  LONG TERM GOALS: Target date: ***  Patient will acknowledge ***/10 pain at least once during  episode of care   Baseline:  Goal status: INITIAL  2.  Patient will score at least ***% on FOTO to signify clinically meaningful improvement in functional abilities.   Baseline:  Goal status: INITIAL  3.  Patient will increase 30s chair stand reps from *** to *** with/without arms to demonstrate and improved functional ability with less pain/difficulty as well as reduce fall risk.  Baseline:  Goal status: INITIAL  4.  *** Baseline:  Goal status: INITIAL  5.  *** Baseline:  Goal status: INITIAL  6.  *** Baseline:  Goal status: INITIAL  PLAN:  PT FREQUENCY: 1-2x/week  PT DURATION: 6 weeks  PLANNED INTERVENTIONS: 97164- PT Re-evaluation, 97110-Therapeutic exercises, 97530- Therapeutic activity, [id number]- Neuromuscular re-education, 97535- Self Care, 02859- Manual therapy, [id number]- Aquatic Therapy, and Dry Needling.  PLAN FOR NEXT SESSION: HEP review and update, manual techniques as appropriate, aerobic tasks, ROM and flexibility activities, strengthening and PREs, TPDN, gait and balance training as needed     Reyes CHRISTELLA Kohut, PT 07/09/2023, 8:26 AM

## 2023-07-09 NOTE — Patient Instructions (Addendum)
 It was great to see you!  Start the attached stretches for your legs daily  You can alternate heat/ice to your thigh  Call the psychiatrist office to schedule an appointment   [facility] is open 24/7 and is a walk-in urgent care  [facility] [address] [place], [place] [address] [phone]  Let's follow-up in 6 weeks, sooner if you have concerns.  If a referral was placed today, you will be contacted for an appointment. Please note that routine referrals can sometimes take up to 3-4 weeks to process. Please call our office if you haven't heard anything after this time frame.  Take care,  Tinnie Harada, NP

## 2023-07-09 NOTE — Progress Notes (Signed)
 Established Patient Office Visit  Subjective   Patient ID: Dominic Bryant, male    DOB: 03/07/1996  Age: 28 y.o. MRN: [id number]  Chief Complaint  Patient presents with   Depression    Follow up, concerns with right leg tightening up, Rx Refill    HPI  Discussed the use of AI scribe software for clinical note transcription with the patient, who gave verbal consent to proceed.  History of Present Illness   The patient, with a history of severe depression, presents for follow-up after a recent increase in Seroquel . He reports improvement in depressive symptoms, but continues to have intermittent suicidal ideation, with the most recent episode occurring two days prior. The patient was alone in his room when the thought occurred, but denies having a plan to harm himself. He has been approved to see a psychiatrist, but has not yet scheduled an appointment.  In addition to his mental health concerns, the patient reports a new issue of right leg discomfort. The discomfort, described as a tightening sensation, is localized to the thigh and occurs only when going from sititng to standing. The symptom has been present for a few days and is described as more of a discomfort than pain. The patient denies any recent trauma, strenuous activity, or swelling in the area.        07/09/2023    3:25 PM 06/14/2023    4:38 PM 05/03/2023    2:16 PM 10/09/2022    9:25 AM 07/03/2022    9:04 AM  Depression screen PHQ 2/9  Decreased Interest 2 1 3 1  0  Down, Depressed, Hopeless 3 3 3 2  0  PHQ - 2 Score 5 4 6 3  0  Altered sleeping 3 3 3 3 3   Tired, decreased energy 1 2 2  0 2  Change in appetite 2 2 1 2 1   Feeling bad or failure about yourself  3 3 3 1  0  Trouble concentrating 1 3 2 2  0  Moving slowly or fidgety/restless 1 2 2  0 0  Suicidal thoughts 3 3 3 2  0  PHQ-9 Score 19 22 22 13 6   Difficult doing work/chores Very difficult Very difficult Very difficult Somewhat difficult       07/09/2023    3:25 PM  06/14/2023    4:38 PM 05/03/2023    2:16 PM  GAD 7 : Generalized Anxiety Score  Nervous, Anxious, on Edge 3 3 2   Control/stop worrying 1 3 3   Worry too much - different things 2 3 3   Trouble relaxing 1 2 3   Restless 3 3 2   Easily annoyed or irritable 3 2 1   Afraid - awful might happen 3 3 2   Total GAD 7 Score 16 19 16   Anxiety Difficulty Very difficult Extremely difficult Somewhat difficult      ROS See pertinent positives and negatives per HPI.    Objective:     BP 102/64 (BP Location: Left Arm, Patient Position: Sitting, Cuff Size: Normal)   Pulse 77   Temp 98.3 F (36.8 C) (Oral)   Ht 5' 7 (1.702 m)   Wt 225 lb 9.6 oz (102.3 kg)   SpO2 96%   BMI 35.33 kg/m    Physical Exam Vitals and nursing note reviewed.  Constitutional:      Appearance: Normal appearance.  HENT:     Head: Normocephalic.  Eyes:     Conjunctiva/sclera: Conjunctivae normal.  Cardiovascular:     Rate and Rhythm: Normal rate and regular  rhythm.     Pulses: Normal pulses.     Heart sounds: Normal heart sounds.  Pulmonary:     Effort: Pulmonary effort is normal.     Breath sounds: Normal breath sounds.  Musculoskeletal:        General: No swelling or tenderness. Normal range of motion.     Cervical back: Normal range of motion.  Skin:    General: Skin is warm.  Neurological:     General: No focal deficit present.     Mental Status: He is alert and oriented to person, place, and time.  Psychiatric:        Mood and Affect: Mood normal.        Behavior: Behavior normal.        Thought Content: Thought content normal.        Judgment: Judgment normal.     Assessment & Plan:   Problem List Items Addressed This Visit       Other   Major depressive disorder with single episode, in partial remission (HCC) - Primary   Chronic, improving but not controlled. There is improvement with increased Seroquel , but suicidal ideation persists without a plan. A psychiatry consult is approved but not  yet scheduled. Continue Zoloft  200mg  daily and seroquel  200mg  daily, will refill the prescription. Encourage scheduling an appointment with a psychiatrist. Follow up in six weeks. Seek immediate help at behavioral health urgent care if suicidal ideation intensifies or a plan develops.      Relevant Medications   sertraline  (ZOLOFT ) 100 MG tablet   Other Visit Diagnoses       Muscle strain       There is discomfort in the right thigh when standing, with no known injury.Start daily leg stretches and alternate heat and ice to the thigh.       Return in about 7 weeks (around 08/27/2023) for Depression.    Tinnie DELENA Harada, NP

## 2023-07-10 ENCOUNTER — Ambulatory Visit: Payer: No Typology Code available for payment source

## 2023-07-10 NOTE — Therapy (Signed)
OUTPATIENT PHYSICAL THERAPY THORACOLUMBAR EVALUATION   Patient Name: Dominic Bryant MRN: [id number] DOB:August 09, 1995, 28 y.o., male Today's Date: 07/13/2023  END OF SESSION:  PT End of Session - 07/13/23 0746     Visit Number 1    Number of Visits 12    Date for PT Re-Evaluation 09/10/23    Authorization Type [employer]    PT Start Time 0745    PT Stop Time 0825    PT Time Calculation (min) 40 min    Activity Tolerance Patient tolerated treatment well    Behavior During Therapy [facility] for tasks assessed/performed             Past Medical History:  Diagnosis Date   Depression    Heart murmur    MDD (major depressive disorder)    Suicide attempt (HCC) 03/11/2023   Jumped off a bridge   Thyroid disease    Past Surgical History:  Procedure Laterality Date   ESOPHAGOGASTRODUODENOSCOPY (EGD) WITH PROPOFOL N/A 05/12/2015   Procedure: ESOPHAGOGASTRODUODENOSCOPY (EGD) WITH PROPOFOL;  Surgeon: Willis Modena, MD;  [facility];  Service: Endoscopy;  Laterality: N/A;   EUS N/A 05/12/2015   Procedure: UPPER ENDOSCOPIC ULTRASOUND (EUS) RADIAL;  Surgeon: Willis Modena, MD;  [facility];  Service: Endoscopy;  Laterality: N/A;   Patient Active Problem List   Diagnosis Date Noted   Chronic cough 06/15/2023   History of suicide attempt 05/03/2023   History of arm fracture 05/03/2023   Chronic bilateral low back pain without sciatica 05/03/2023   Tobacco use 05/03/2023   Class 2 severe obesity due to excess calories with serious comorbidity and body mass index (BMI) of 36.0 to 36.9 in adult [facility]) 10/09/2022   Major depressive disorder with single episode, in partial remission (HCC) 07/01/2020   Acquired hypothyroidism 07/01/2020    PCP: Gerre Scull, NP   REFERRING PROVIDER: Nadara Mustard, MD  REFERRING DIAG: S32.000A (ICD-10-CM) - Closed compression fracture of lumbosacral spine, initial encounter [facility])  Rationale for Evaluation and Treatment:  Rehabilitation  THERAPY DIAG:  Chronic bilateral low back pain without sciatica - Plan: PT plan of care cert/re-cert  Muscle weakness (generalized) - Plan: PT plan of care cert/re-cert  ONSET DATE: 03/11/23  SUBJECTIVE:                                                                                                                                                                                           SUBJECTIVE STATEMENT: Describes low back "tightness".  Has difficulty getting out of bed due to twisting motions.  PERTINENT HISTORY:  Patient is a 28 year old gentleman who is seen in  follow-up for L2 compression fracture.  Patient was last seen October 14.  Injury on September 15.  Patient states he still has pain in the lumbar spine without radicular symptoms.  Patient states he has pain worse after prolonged sitting.   Assessment & Plan: Visit Diagnoses:  1. Closed compression fracture of lumbosacral spine, initial encounter [facility])       Plan: Will set patient up with physical therapy.   Follow-Up Instructions: Return if symptoms worsen or fail to improve.    Ortho Exam   Patient is alert, oriented, no adenopathy, well-dressed, normal affect, normal respiratory effort. Examination patient has a normal gait.  No focal motor weakness in either lower extremity.  No radicular pain.  Previous radiographs shows a superior endplate compression fracture at L2.  PAIN:  Are you having pain? Yes: NPRS scale: 5-7/10 Pain location: low back Pain description: ache, tight Aggravating factors: position changes Relieving factors: undetermined  PRECAUTIONS: Back  RED FLAGS: None   WEIGHT BEARING RESTRICTIONS: No  FALLS:  Has patient fallen in last 6 months? No  OCCUPATION: not working  PLOF: Independent  PATIENT GOALS: To manage my low back symptoms  NEXT MD VISIT: TBD  OBJECTIVE:  Note: Objective measures were completed at Evaluation unless otherwise noted.  DIAGNOSTIC  FINDINGS:  None recent  PATIENT SURVEYS:  FOTO 37(52 predicted)    MUSCLE LENGTH: Hamstrings: Right 65 deg; Left 65 deg Thomas test: PKB unremarkable B  POSTURE:  slouched posture  PALPATION: Lumbar spring test unremarkable   LUMBAR ROM:   AROM eval  Flexion 90%  Extension 75%  Right lateral flexion 90%  Left lateral flexion 90%  Right rotation 50%  Left rotation 75%   (Blank rows = not tested)  LOWER EXTREMITY ROM:   WNL  Active  Right eval Left eval  Hip flexion    Hip extension    Hip abduction    Hip adduction    Hip internal rotation    Hip external rotation    Knee flexion    Knee extension    Ankle dorsiflexion    Ankle plantarflexion    Ankle inversion    Ankle eversion     (Blank rows = not tested)  LOWER EXTREMITY MMT:    MMT Right eval Left eval  Hip flexion    Hip extension    Hip abduction    Hip adduction    Hip internal rotation    Hip external rotation    Knee flexion    Knee extension    Ankle dorsiflexion    Ankle plantarflexion    Ankle inversion    Ankle eversion    core 3+ 3+   (Blank rows = not tested)  LUMBAR SPECIAL TESTS:  Straight leg raise test: Negative, Slump test: Negative, and FABER test: tight but no back pain  FUNCTIONAL TESTS: 5x STS 35s arms crossed  GAIT: Distance walked: 16ft x2 Assistive device utilized: None Level of assistance: Complete Independence Comments: unremarkable  TREATMENT DATE:  07/13/23 Eval and HEP  PATIENT EDUCATION:  Education details: Discussed eval findings, rehab rationale and POC and patient is in agreement  Person educated: Patient Education method: Explanation Education comprehension: verbalized understanding and needs further education  HOME EXERCISE PROGRAM: Access Code: [email or web address] URL: [email or web address] Date:  07/13/2023 Prepared by: Gustavus Bryant  Exercises - Supine 90/90 Abdominal Bracing  - 2 x daily - 5 x weekly - 1 sets - 2 reps - 30s hold - Sit to Stand with Arms Crossed  - 2 x daily - 5 x weekly - 1 sets - 5 reps - Static Prone on Elbows  - 2 x daily - 5 x weekly - 1 sets - 1 reps - 2 min hold  ASSESSMENT:  CLINICAL IMPRESSION: Patient is a 28 y.o. male who was seen today for physical therapy evaluation and treatment for chronic low back pain with underlying L2 compression fracture.   OBJECTIVE IMPAIRMENTS: decreased activity tolerance, decreased endurance, decreased knowledge of condition, decreased mobility, decreased strength, increased fascial restrictions, improper body mechanics, postural dysfunction, and pain.   ACTIVITY LIMITATIONS: carrying, lifting, bending, sitting, standing, squatting, and bed mobility  PERSONAL FACTORS: Age, Fitness, Past/current experiences, and Time since onset of injury/illness/exacerbation are also affecting patient's functional outcome.   REHAB POTENTIAL: Good  CLINICAL DECISION MAKING: Stable/uncomplicated  EVALUATION COMPLEXITY: Low   GOALS: Goals reviewed with patient? No  SHORT TERM GOALS: Target date: 08/03/2023    Patient to demonstrate independence in HEP  Baseline: [email or web address] Goal status: INITIAL  2.  Patient will decrease 5x STS stand  from 35 to 25 with/without arms to demonstrate and improved functional ability with less pain/difficulty as well as reduce fall risk.  Baseline: 35s Goal status: INITIAL    LONG TERM GOALS: Target date: 08/24/2023    Patient will acknowledge 4/10 best pain at least once during episode of care   Baseline: 5/10 Goal status: INITIAL  2.  Patient will score at least 52% on FOTO to signify clinically meaningful improvement in functional abilities.   Baseline: 37% Goal status: INITIAL  3.  Patient will decrease 5x STS stand  from 35 to 15 with/without arms to demonstrate and improved functional  ability with less pain/difficulty as well as reduce fall risk.  Baseline:  Goal status: INITIAL  4.  Increase R rotation to 75% Baseline:  AROM eval  Flexion 90%  Extension 75%  Right lateral flexion 90%  Left lateral flexion 90%  Right rotation 50%  Left rotation 75%   Goal status: INITIAL  5.  4/5 core strength Baseline: 3+/5 core strength Goal status: INITIAL    PLAN:  PT FREQUENCY: 1-2x/week  PT DURATION: 6 weeks  PLANNED INTERVENTIONS: 97164- PT Re-evaluation, 97110-Therapeutic exercises, 97530- Therapeutic activity, 97112- Neuromuscular re-education, 97535- Self Care, 16109- Manual therapy, [id number]- Aquatic Therapy, and Dry Needling.  PLAN FOR NEXT SESSION: HEP review and update, manual techniques as appropriate, aerobic tasks, ROM and flexibility activities, strengthening and PREs, TPDN, gait and balance training as needed     Hildred Laser, PT 07/13/2023, 8:23 AM

## 2023-07-13 ENCOUNTER — Ambulatory Visit: Payer: No Typology Code available for payment source | Attending: Orthopaedic Surgery

## 2023-07-13 ENCOUNTER — Other Ambulatory Visit: Payer: Self-pay

## 2023-07-13 DIAGNOSIS — M545 Low back pain, unspecified: Secondary | ICD-10-CM | POA: Diagnosis present

## 2023-07-13 DIAGNOSIS — G8929 Other chronic pain: Secondary | ICD-10-CM | POA: Diagnosis present

## 2023-07-13 DIAGNOSIS — S32000A Wedge compression fracture of unspecified lumbar vertebra, initial encounter for closed fracture: Secondary | ICD-10-CM | POA: Insufficient documentation

## 2023-07-13 DIAGNOSIS — M6281 Muscle weakness (generalized): Secondary | ICD-10-CM | POA: Insufficient documentation

## 2023-07-15 ENCOUNTER — Encounter: Payer: Self-pay | Admitting: Nurse Practitioner

## 2023-07-18 ENCOUNTER — Ambulatory Visit (HOSPITAL_BASED_OUTPATIENT_CLINIC_OR_DEPARTMENT_OTHER): Payer: No Typology Code available for payment source

## 2023-07-18 ENCOUNTER — Ambulatory Visit (HOSPITAL_BASED_OUTPATIENT_CLINIC_OR_DEPARTMENT_OTHER): Payer: No Typology Code available for payment source | Admitting: Orthopaedic Surgery

## 2023-07-18 DIAGNOSIS — S52022A Displaced fracture of olecranon process without intraarticular extension of left ulna, initial encounter for closed fracture: Secondary | ICD-10-CM

## 2023-07-18 NOTE — Progress Notes (Signed)
Chief Complaint: Left olecranon fracture     History of Present Illness:   07/18/2023: Follow-up for his olecranon fracture.  Overall he is doing extremely well.  He has no pain at this visit.  He does have some loss of extension although this is mild  Dominic Bryant is a 28 y.o. male presents today now 6 weeks out from a left olecranon fracture after he had jumped off a bridge and in a suicide attempt.  He was initially seen in the hospital for several days and placed in a splint.  He had initially had follow-up with Dr. Hulda Humphrey but has subsequently seen Dr. Lajoyce Corners who had placed him into a long-arm cast.  He is here today for further discussion and management.  He does have a upcoming appointment scheduled with his primary care doctor    Surgical History:   None  PMH/PSH/Family History/Social History/Meds/Allergies:    Past Medical History:  Diagnosis Date   Depression    Heart murmur    MDD (major depressive disorder)    Suicide attempt (HCC) 03/11/2023   Jumped off a bridge   Thyroid disease    Past Surgical History:  Procedure Laterality Date   ESOPHAGOGASTRODUODENOSCOPY (EGD) WITH PROPOFOL N/A 05/12/2015   Procedure: ESOPHAGOGASTRODUODENOSCOPY (EGD) WITH PROPOFOL;  Surgeon: Willis Modena, MD;  Location: [facility];  Service: Endoscopy;  Laterality: N/A;   EUS N/A 05/12/2015   Procedure: UPPER ENDOSCOPIC ULTRASOUND (EUS) RADIAL;  Surgeon: Willis Modena, MD;  Location: [facility];  Service: Endoscopy;  Laterality: N/A;   Social History   Socioeconomic History   Marital status: Single    Spouse name: Not on file   Number of children: Not on file   Years of education: Not on file   Highest education level: Associate degree: occupational, Scientist, product/process development, or vocational program  Occupational History   Not on file  Tobacco Use   Smoking status: Every Day    Current packs/day: 0.50    Average packs/day: 0.5 packs/day for 8.1 years (4.0 ttl  pk-yrs)    Types: Cigarettes    Start date: 2017   Smokeless tobacco: Never  Vaping Use   Vaping status: Never Used  Substance and Sexual Activity   Alcohol use: Yes    Comment: social   Drug use: Yes    Types: Marijuana   Sexual activity: Yes    Partners: Female    Birth control/protection: Condom  Other Topics Concern   Not on file  Social History Narrative   Not on file   Social Drivers of Health   Financial Resource Strain: High Risk (06/14/2023)   Overall Financial Resource Strain (CARDIA)    Difficulty of Paying Living Expenses: Very hard  Food Insecurity: Food Insecurity Present (06/14/2023)   Hunger Vital Sign    Worried About Running Out of Food in the Last Year: Sometimes true    Ran Out of Food in the Last Year: Often true  Transportation Needs: No Transportation Needs (06/14/2023)   PRAPARE - Administrator, Civil Service (Medical): No    Lack of Transportation (Non-Medical): No  Physical Activity: Insufficiently Active (06/14/2023)   Exercise Vital Sign    Days of Exercise per Week: 3 days    Minutes of Exercise per Session: 10 min  Stress: Stress Concern Present (06/14/2023)  [employer] of Occupational Health - Occupational Stress Questionnaire    Feeling of Stress : Very much  Social Connections: Unknown (06/14/2023)   Social Connection and Isolation Panel [NHANES]    Frequency of Communication with Friends and Family: Once a week    Frequency of Social Gatherings with Friends and Family: Patient declined    Attends Religious Services: Never    Database administrator or Organizations: No    Attends Engineer, structural: Not on file    Marital Status: Never married  Recent Concern: Social Connections - Socially Isolated (04/18/2023)   Social Connection and Isolation Panel [NHANES]    Frequency of Communication with Friends and Family: Once a week    Frequency of Social Gatherings with Friends and Family: Once a week     Attends Religious Services: Never    Database administrator or Organizations: No    Attends Engineer, structural: Not on file    Marital Status: Never married   Family History  Problem Relation Age of Onset   Crohn's disease Mother    Asthma Father    Alcohol abuse Father    No Known Allergies Current Outpatient Medications  Medication Sig Dispense Refill   celecoxib (CELEBREX) 200 MG capsule Take 1 capsule (200 mg total) by mouth daily. 30 capsule 0   levocetirizine (XYZAL) 5 MG tablet Take 1 tablet (5 mg total) by mouth every evening. (Patient not taking: Reported on 07/09/2023) 30 tablet 1   QUEtiapine (SEROQUEL XR) 200 MG 24 hr tablet Take 1 tablet (200 mg total) by mouth at bedtime. 30 tablet 1   sertraline (ZOLOFT) 100 MG tablet Take 2 tablets (200 mg total) by mouth daily. 60 tablet 0   No current facility-administered medications for this visit.   No results found.  Review of Systems:   A ROS was performed including pertinent positives and negatives as documented in the HPI.  Physical Exam :   Constitutional: NAD and appears stated age Neurological: Alert and oriented Psych: Appropriate affect and cooperative There were no vitals taken for this visit.   Comprehensive Musculoskeletal Exam:    Left arm cast is removed.  He does have active elbow flexion to approximately 130 degrees and active elbow extension 5 degrees short of full.  Otherwise swelling about the elbow.  Distal neurosensory exam is intact  Imaging:   Xray (3 views left elbow): Casted olecranon fracture with some interval callus   I personally reviewed and interpreted the radiographs.   Assessment:   28 y.o. male with a now left olecranon fibrous nonunion.  Overall this is not painful.  He has near full extension of the elbow.  Given the fact that this is nonpainful I have advised that overall I do believe he will continue have quite a good outcome.  I will plan to see him back as needed Plan  :    -Return to clinic as needed     I personally saw and evaluated the patient, and participated in the management and treatment plan.  Huel Cote, MD Attending Physician, Orthopedic Surgery  This document was dictated using Dragon voice recognition software. A reasonable attempt at proof reading has been made to minimize errors.

## 2023-07-19 ENCOUNTER — Ambulatory Visit: Payer: No Typology Code available for payment source

## 2023-07-19 DIAGNOSIS — M6281 Muscle weakness (generalized): Secondary | ICD-10-CM

## 2023-07-19 DIAGNOSIS — M545 Low back pain, unspecified: Secondary | ICD-10-CM | POA: Diagnosis not present

## 2023-07-19 DIAGNOSIS — G8929 Other chronic pain: Secondary | ICD-10-CM

## 2023-07-19 NOTE — Therapy (Signed)
OUTPATIENT PHYSICAL THERAPY NOTE   Patient Name: Dominic Bryant MRN: [id number] DOB:1995/11/28, 28 y.o., male Today's Date: 07/19/2023  END OF SESSION:  PT End of Session - 07/19/23 1622     Visit Number 2    Number of Visits 12    Date for PT Re-Evaluation 09/10/23    Authorization Type [email or web address]    PT Start Time 1623    PT Stop Time 1654    PT Time Calculation (min) 31 min    Activity Tolerance Patient tolerated treatment well    Behavior During Therapy [facility] for tasks assessed/performed              Past Medical History:  Diagnosis Date   Depression    Heart murmur    MDD (major depressive disorder)    Suicide attempt (HCC) 03/11/2023   Jumped off a bridge   Thyroid disease    Past Surgical History:  Procedure Laterality Date   ESOPHAGOGASTRODUODENOSCOPY (EGD) WITH PROPOFOL N/A 05/12/2015   Procedure: ESOPHAGOGASTRODUODENOSCOPY (EGD) WITH PROPOFOL;  Surgeon: Willis Modena, MD;  Location: [facility];  Service: Endoscopy;  Laterality: N/A;   EUS N/A 05/12/2015   Procedure: UPPER ENDOSCOPIC ULTRASOUND (EUS) RADIAL;  Surgeon: Willis Modena, MD;  Location: [facility];  Service: Endoscopy;  Laterality: N/A;   Patient Active Problem List   Diagnosis Date Noted   Chronic cough 06/15/2023   History of suicide attempt 05/03/2023   History of arm fracture 05/03/2023   Chronic bilateral low back pain without sciatica 05/03/2023   Tobacco use 05/03/2023   Class 2 severe obesity due to excess calories with serious comorbidity and body mass index (BMI) of 36.0 to 36.9 in adult [facility]) 10/09/2022   Major depressive disorder with single episode, in partial remission (HCC) 07/01/2020   Acquired hypothyroidism 07/01/2020    PCP: Gerre Scull, NP   REFERRING PROVIDER: Nadara Mustard, MD  REFERRING DIAG: S32.000A (ICD-10-CM) - Closed compression fracture of lumbosacral spine, initial encounter [facility])  Rationale for Evaluation and Treatment: Rehabilitation  THERAPY  DIAG:  Chronic bilateral low back pain without sciatica  Muscle weakness (generalized)  ONSET DATE: 03/11/23  SUBJECTIVE:                                                                                                                                                                                           SUBJECTIVE STATEMENT: Patient reports that he is having pain with bending, walking, sitting for a long time. He also states  "laying down on my back or side is a toss up, sometimes it feels good, sometimes it doesn't."     PERTINENT  HISTORY:  Patient is a 28 year old gentleman who is seen in follow-up for L2 compression fracture.  Patient was last seen October 14.  Injury on September 15.  Patient states he still has pain in the lumbar spine without radicular symptoms.  Patient states he has pain worse after prolonged sitting.   Assessment & Plan: Visit Diagnoses:  1. Closed compression fracture of lumbosacral spine, initial encounter [facility])       Plan: Will set patient up with physical therapy.   Follow-Up Instructions: Return if symptoms worsen or fail to improve.    Ortho Exam   Patient is alert, oriented, no adenopathy, well-dressed, normal affect, normal respiratory effort. Examination patient has a normal gait.  No focal motor weakness in either lower extremity.  No radicular pain.  Previous radiographs shows a superior endplate compression fracture at L2.  PAIN:  Are you having pain? Yes: NPRS scale: 5-7/10 Pain location: low back Pain description: ache, tight Aggravating factors: position changes Relieving factors: undetermined  PRECAUTIONS: Back  RED FLAGS: None   WEIGHT BEARING RESTRICTIONS: No  FALLS:  Has patient fallen in last 6 months? No  OCCUPATION: not working  PLOF: Independent  PATIENT GOALS: To manage my low back symptoms  NEXT MD VISIT: TBD  OBJECTIVE:  Note: Objective measures were completed at Evaluation unless otherwise  noted.  DIAGNOSTIC FINDINGS:  None recent  PATIENT SURVEYS:  FOTO 37(52 predicted)    MUSCLE LENGTH: Hamstrings: Right 65 deg; Left 65 deg Thomas test: PKB unremarkable B  POSTURE:  slouched posture  PALPATION: Lumbar spring test unremarkable   LUMBAR ROM:   AROM eval  Flexion 90%  Extension 75%  Right lateral flexion 90%  Left lateral flexion 90%  Right rotation 50%  Left rotation 75%   (Blank rows = not tested)  LOWER EXTREMITY ROM:   WNL  Active  Right eval Left eval  Hip flexion    Hip extension    Hip abduction    Hip adduction    Hip internal rotation    Hip external rotation    Knee flexion    Knee extension    Ankle dorsiflexion    Ankle plantarflexion    Ankle inversion    Ankle eversion     (Blank rows = not tested)  LOWER EXTREMITY MMT:    MMT Right eval Left eval  Hip flexion    Hip extension    Hip abduction    Hip adduction    Hip internal rotation    Hip external rotation    Knee flexion    Knee extension    Ankle dorsiflexion    Ankle plantarflexion    Ankle inversion    Ankle eversion    core 3+ 3+   (Blank rows = not tested)  LUMBAR SPECIAL TESTS:  Straight leg raise test: Negative, Slump test: Negative, and FABER test: tight but no back pain  FUNCTIONAL TESTS: 5x STS 35s arms crossed  GAIT: Distance walked: 57ft x2 Assistive device utilized: None Level of assistance: Complete Independence Comments: unremarkable  TREATMENT DATE:   [facility] Adult PT Treatment:                                                DATE: 07/19/2023  Therapeutic Exercise: NuStep, level 3 x 5 minutes   Sit to Stand with Arms Crossed  3 x 5 Supine pelvic tilt, anterior-to-posterior, x 10 with pillow under hips for tactile feedback; frequent verbal and visual cues required Supine Marching, 2 x 10 each Supine 90/90 Abdominal Bracing  Supine "up, up, down, down" to 90/90 "march", x10    07/13/23 Eval and HEP                                                                                                                                 PATIENT EDUCATION:  Education details: Discussed eval findings, rehab rationale and POC and patient is in agreement  Person educated: Patient Education method: Explanation Education comprehension: verbalized understanding and needs further education  HOME EXERCISE PROGRAM: Access Code: [email or web address] URL: [email or web address] Date: 07/19/2023 Prepared by: Mauri Reading  Exercises - Sit to Stand with Arms Crossed  - 2 x daily - 5 x weekly - 1 sets - 5 reps - Supine 90/90 Abdominal Bracing  - 2 x daily - 5 x weekly - 1 sets - 2 reps - 30s hold - Hooklying Sequential Leg March and Lower  - 1 x daily - 5 x weekly - 1 sets - 10 reps - Supine Posterior Pelvic Tilt  - 1 x daily - 5 x weekly - 1 sets - 10 reps - 3 sec hold - Static Prone on Elbows  - 2 x daily - 5 x weekly - 1 sets - 1 reps - 2 min hold  ASSESSMENT:  CLINICAL IMPRESSION: Kehinde was able to tolerate initial treatment session with no more than 4/10 pain. He is limited with supine pelvic tilt in sagittal plane and requires extensive cueing for correct performance. However, he is demonstrating improved mobility at end of session. Instructed patient to work on this until his next visit. He will continue to benefit from progression of lumbopelvic mobility, and core strengthening.    OBJECTIVE IMPAIRMENTS: decreased activity tolerance, decreased endurance, decreased knowledge of condition, decreased mobility, decreased strength, increased fascial restrictions, improper body mechanics, postural dysfunction, and pain.   ACTIVITY LIMITATIONS: carrying, lifting, bending, sitting, standing, squatting, and bed mobility  PERSONAL FACTORS: Age, Fitness, Past/current experiences, and Time since onset of injury/illness/exacerbation are also affecting patient's functional outcome.   REHAB POTENTIAL: Good  CLINICAL DECISION MAKING:  Stable/uncomplicated  EVALUATION COMPLEXITY: Low   GOALS: Goals reviewed with patient? No  SHORT TERM GOALS: Target date: 08/03/2023    Patient to demonstrate independence in HEP  Baseline: [email or web address] Goal status: INITIAL  2.  Patient will decrease 5x STS stand  from 35 to 25 with/without arms to demonstrate and improved functional ability with less pain/difficulty as well as reduce fall risk.  Baseline: 35s Goal status: INITIAL    LONG TERM GOALS: Target date: 08/24/2023    Patient will acknowledge 4/10 best pain at least once during episode of care   Baseline: 5/10 Goal status: INITIAL  2.  Patient will score at least 52% on FOTO to signify clinically meaningful improvement  in functional abilities.   Baseline: 37% Goal status: INITIAL  3.  Patient will decrease 5x STS stand  from 35 to 15 with/without arms to demonstrate and improved functional ability with less pain/difficulty as well as reduce fall risk.  Baseline:  Goal status: INITIAL  4.  Increase R rotation to 75% Baseline:  AROM eval  Flexion 90%  Extension 75%  Right lateral flexion 90%  Left lateral flexion 90%  Right rotation 50%  Left rotation 75%   Goal status: INITIAL  5.  4/5 core strength Baseline: 3+/5 core strength Goal status: INITIAL    PLAN:  PT FREQUENCY: 1-2x/week  PT DURATION: 6 weeks  PLANNED INTERVENTIONS: 97164- PT Re-evaluation, 97110-Therapeutic exercises, 97530- Therapeutic activity, 97112- Neuromuscular re-education, 97535- Self Care, [id number]- Manual therapy, [id number]- Aquatic Therapy, and Dry Needling.  PLAN FOR NEXT SESSION: HEP review and update, manual techniques as appropriate, aerobic tasks, ROM and flexibility activities, strengthening and PREs, TPDN, gait and balance training as needed     Mauri Reading, PT, DPT  07/19/2023 6:39 PM

## 2023-07-21 ENCOUNTER — Encounter: Payer: Self-pay | Admitting: Nurse Practitioner

## 2023-07-24 ENCOUNTER — Telehealth: Payer: Self-pay

## 2023-07-24 ENCOUNTER — Ambulatory Visit: Payer: No Typology Code available for payment source

## 2023-07-24 NOTE — Telephone Encounter (Signed)
Spoke to patient regarding missed appointment, he stated he was in too much pain today. Reminded of next appointment.  1st no-show  Dominic Bryant, [place] 07/24/23 4:44 PM

## 2023-07-24 NOTE — Therapy (Incomplete)
OUTPATIENT PHYSICAL THERAPY NOTE   Patient Name: Dominic Bryant MRN: [id number] DOB:1995-07-07, 28 y.o., male Today's Date: 07/24/2023  END OF SESSION:     Past Medical History:  Diagnosis Date   Depression    Heart murmur    MDD (major depressive disorder)    Suicide attempt (HCC) 03/11/2023   Jumped off a bridge   Thyroid disease    Past Surgical History:  Procedure Laterality Date   ESOPHAGOGASTRODUODENOSCOPY (EGD) WITH PROPOFOL N/A 05/12/2015   Procedure: ESOPHAGOGASTRODUODENOSCOPY (EGD) WITH PROPOFOL;  Surgeon: Willis Modena, MD;  Location: [facility];  Service: Endoscopy;  Laterality: N/A;   EUS N/A 05/12/2015   Procedure: UPPER ENDOSCOPIC ULTRASOUND (EUS) RADIAL;  Surgeon: Willis Modena, MD;  Location: [facility];  Service: Endoscopy;  Laterality: N/A;   Patient Active Problem List   Diagnosis Date Noted   Chronic cough 06/15/2023   History of suicide attempt 05/03/2023   History of arm fracture 05/03/2023   Chronic bilateral low back pain without sciatica 05/03/2023   Tobacco use 05/03/2023   Class 2 severe obesity due to excess calories with serious comorbidity and body mass index (BMI) of 36.0 to 36.9 in adult [facility]) 10/09/2022   Major depressive disorder with single episode, in partial remission (HCC) 07/01/2020   Acquired hypothyroidism 07/01/2020    PCP: Gerre Scull, NP   REFERRING PROVIDER: Nadara Mustard, MD  REFERRING DIAG: S32.000A (ICD-10-CM) - Closed compression fracture of lumbosacral spine, initial encounter [facility])  Rationale for Evaluation and Treatment: Rehabilitation  THERAPY DIAG:  No diagnosis found.  ONSET DATE: 03/11/23  SUBJECTIVE:                                                                                                                                                                                           SUBJECTIVE STATEMENT: ***  Patient reports that he is having pain with bending, walking, sitting for a long  time. He also states  "laying down on my back or side is a toss up, sometimes it feels good, sometimes it doesn't."     PERTINENT HISTORY:  Patient is a 28 year old gentleman who is seen in follow-up for L2 compression fracture.  Patient was last seen October 14.  Injury on September 15.  Patient states he still has pain in the lumbar spine without radicular symptoms.  Patient states he has pain worse after prolonged sitting.   Assessment & Plan: Visit Diagnoses:  1. Closed compression fracture of lumbosacral spine, initial encounter [facility])       Plan: Will set patient up with physical therapy.   Follow-Up Instructions: Return if symptoms worsen or  fail to improve.    Ortho Exam   Patient is alert, oriented, no adenopathy, well-dressed, normal affect, normal respiratory effort. Examination patient has a normal gait.  No focal motor weakness in either lower extremity.  No radicular pain.  Previous radiographs shows a superior endplate compression fracture at L2.  PAIN:  Are you having pain? Yes: NPRS scale: 5-7/10 Pain location: low back Pain description: ache, tight Aggravating factors: position changes Relieving factors: undetermined  PRECAUTIONS: Back  RED FLAGS: None   WEIGHT BEARING RESTRICTIONS: No  FALLS:  Has patient fallen in last 6 months? No  OCCUPATION: not working  PLOF: Independent  PATIENT GOALS: To manage my low back symptoms  NEXT MD VISIT: TBD  OBJECTIVE:  Note: Objective measures were completed at Evaluation unless otherwise noted.  DIAGNOSTIC FINDINGS:  None recent  PATIENT SURVEYS:  FOTO 37(52 predicted)   MUSCLE LENGTH: Hamstrings: Right 65 deg; Left 65 deg Thomas test: PKB unremarkable B  POSTURE:  slouched posture  PALPATION: Lumbar spring test unremarkable   LUMBAR ROM:   AROM eval  Flexion 90%  Extension 75%  Right lateral flexion 90%  Left lateral flexion 90%  Right rotation 50%  Left rotation 75%   (Blank rows = not  tested)  LOWER EXTREMITY ROM:   WNL  Active  Right eval Left eval  Hip flexion    Hip extension    Hip abduction    Hip adduction    Hip internal rotation    Hip external rotation    Knee flexion    Knee extension    Ankle dorsiflexion    Ankle plantarflexion    Ankle inversion    Ankle eversion     (Blank rows = not tested)  LOWER EXTREMITY MMT:    MMT Right eval Left eval  Hip flexion    Hip extension    Hip abduction    Hip adduction    Hip internal rotation    Hip external rotation    Knee flexion    Knee extension    Ankle dorsiflexion    Ankle plantarflexion    Ankle inversion    Ankle eversion    core 3+ 3+   (Blank rows = not tested)  LUMBAR SPECIAL TESTS:  Straight leg raise test: Negative, Slump test: Negative, and FABER test: tight but no back pain  FUNCTIONAL TESTS: 5x STS 35s arms crossed  GAIT: Distance walked: 85ft x2 Assistive device utilized: None Level of assistance: Complete Independence Comments: unremarkable  TREATMENT DATE:  [facility] Adult PT Treatment:                                                DATE: 07/24/23 Therapeutic Exercise: NuStep, level 3 x 5 minutes   Sit to Stand with Arms Crossed  3 x 5 Supine pelvic tilt, anterior-to-posterior, x 10 with pillow under hips for tactile feedback; frequent verbal and visual cues required Supine Marching, 2 x 10 each Supine 90/90 Abdominal Bracing  Supine "up, up, down, down" to 90/90 "march", x10    [facility] Adult PT Treatment:  DATE: 07/19/2023  Therapeutic Exercise: NuStep, level 3 x 5 minutes   Sit to Stand with Arms Crossed  3 x 5 Supine pelvic tilt, anterior-to-posterior, x 10 with pillow under hips for tactile feedback; frequent verbal and visual cues required Supine Marching, 2 x 10 each Supine 90/90 Abdominal Bracing  Supine "up, up, down, down" to 90/90 "march", x10    07/13/23 Eval and HEP                                                                                                                                 PATIENT EDUCATION:  Education details: Discussed eval findings, rehab rationale and POC and patient is in agreement  Person educated: Patient Education method: Explanation Education comprehension: verbalized understanding and needs further education  HOME EXERCISE PROGRAM: Access Code: [email or web address] URL: [email or web address] Date: 07/19/2023 Prepared by: Mauri Reading  Exercises - Sit to Stand with Arms Crossed  - 2 x daily - 5 x weekly - 1 sets - 5 reps - Supine 90/90 Abdominal Bracing  - 2 x daily - 5 x weekly - 1 sets - 2 reps - 30s hold - Hooklying Sequential Leg March and Lower  - 1 x daily - 5 x weekly - 1 sets - 10 reps - Supine Posterior Pelvic Tilt  - 1 x daily - 5 x weekly - 1 sets - 10 reps - 3 sec hold - Static Prone on Elbows  - 2 x daily - 5 x weekly - 1 sets - 1 reps - 2 min hold  ASSESSMENT:  CLINICAL IMPRESSION: *** Patient presents to PT reporting   Zyron was able to tolerate initial treatment session with no more than 4/10 pain. He is limited with supine pelvic tilt in sagittal plane and requires extensive cueing for correct performance. However, he is demonstrating improved mobility at end of session. Instructed patient to work on this until his next visit. He will continue to benefit from progression of lumbopelvic mobility, and core strengthening.    OBJECTIVE IMPAIRMENTS: decreased activity tolerance, decreased endurance, decreased knowledge of condition, decreased mobility, decreased strength, increased fascial restrictions, improper body mechanics, postural dysfunction, and pain.   ACTIVITY LIMITATIONS: carrying, lifting, bending, sitting, standing, squatting, and bed mobility  PERSONAL FACTORS: Age, Fitness, Past/current experiences, and Time since onset of injury/illness/exacerbation are also affecting patient's functional outcome.   REHAB POTENTIAL:  Good  CLINICAL DECISION MAKING: Stable/uncomplicated  EVALUATION COMPLEXITY: Low   GOALS: Goals reviewed with patient? No  SHORT TERM GOALS: Target date: 08/03/2023    Patient to demonstrate independence in HEP  Baseline: KMELHHFP Goal status: INITIAL  2.  Patient will decrease 5x STS stand  from 35 to 25 with/without arms to demonstrate and improved functional ability with less pain/difficulty as well as reduce fall risk.  Baseline: 35s Goal status: INITIAL    LONG TERM GOALS: Target date: 08/24/2023    Patient will acknowledge 4/10 best pain at least  once during episode of care   Baseline: 5/10 Goal status: INITIAL  2.  Patient will score at least 52% on FOTO to signify clinically meaningful improvement in functional abilities.   Baseline: 37% Goal status: INITIAL  3.  Patient will decrease 5x STS stand  from 35 to 15 with/without arms to demonstrate and improved functional ability with less pain/difficulty as well as reduce fall risk.  Baseline:  Goal status: INITIAL  4.  Increase R rotation to 75% Baseline:  AROM eval  Flexion 90%  Extension 75%  Right lateral flexion 90%  Left lateral flexion 90%  Right rotation 50%  Left rotation 75%   Goal status: INITIAL  5.  4/5 core strength Baseline: 3+/5 core strength Goal status: INITIAL    PLAN:  PT FREQUENCY: 1-2x/week  PT DURATION: 6 weeks  PLANNED INTERVENTIONS: 97164- PT Re-evaluation, 97110-Therapeutic exercises, 97530- Therapeutic activity, 97112- Neuromuscular re-education, 97535- Self Care, 40102- Manual therapy, [id number]- Aquatic Therapy, and Dry Needling.  PLAN FOR NEXT SESSION: HEP review and update, manual techniques as appropriate, aerobic tasks, ROM and flexibility activities, strengthening and PREs, TPDN, gait and balance training as needed     Berta Minor, PTA 07/24/2023 1:08 PM

## 2023-07-26 ENCOUNTER — Ambulatory Visit: Payer: No Typology Code available for payment source

## 2023-07-26 DIAGNOSIS — M545 Low back pain, unspecified: Secondary | ICD-10-CM | POA: Diagnosis not present

## 2023-07-26 DIAGNOSIS — M6281 Muscle weakness (generalized): Secondary | ICD-10-CM

## 2023-07-26 NOTE — Therapy (Signed)
OUTPATIENT PHYSICAL THERAPY NOTE   Patient Name: ALEXANDRU MOORER MRN: [id number] DOB:[birth year], 28 y.o., male Today's Date: 07/26/2023  END OF SESSION:  PT End of Session - 07/26/23 1622     Visit Number 3    Number of Visits 12    Date for PT Re-Evaluation 09/10/23    Authorization Type [employer]    Authorization Time Period 12 visits 07/16/23-09/01/23    Authorization - Visit Number 2    Authorization - Number of Visits 12    PT Start Time 1622    PT Stop Time 1700    PT Time Calculation (min) 38 min    Activity Tolerance Patient tolerated treatment well    Behavior During Therapy [facility] for tasks assessed/performed             Past Medical History:  Diagnosis Date   Depression    Heart murmur    MDD (major depressive disorder)    Suicide attempt (HCC) 03/11/2023   Jumped off a bridge   Thyroid disease    Past Surgical History:  Procedure Laterality Date   ESOPHAGOGASTRODUODENOSCOPY (EGD) WITH PROPOFOL N/A 05/12/2015   Procedure: ESOPHAGOGASTRODUODENOSCOPY (EGD) WITH PROPOFOL;  Surgeon: Willis Modena, MD;  [facility];  Service: Endoscopy;  Laterality: N/A;   EUS N/A 05/12/2015   Procedure: UPPER ENDOSCOPIC ULTRASOUND (EUS) RADIAL;  Surgeon: Willis Modena, MD;  Location: [facility];  Service: Endoscopy;  Laterality: N/A;   Patient Active Problem List   Diagnosis Date Noted   Chronic cough 06/15/2023   History of suicide attempt 05/03/2023   History of arm fracture 05/03/2023   Chronic bilateral low back pain without sciatica 05/03/2023   Tobacco use 05/03/2023   Class 2 severe obesity due to excess calories with serious comorbidity and body mass index (BMI) of 36.0 to 36.9 in adult [facility]) 10/09/2022   Major depressive disorder with single episode, in partial remission (HCC) 07/01/2020   Acquired hypothyroidism 07/01/2020    PCP: Gerre Scull, NP   REFERRING PROVIDER: Nadara Mustard, MD  REFERRING DIAG: S32.000A (ICD-10-CM) - Closed  compression fracture of lumbosacral spine, initial encounter [facility])  Rationale for Evaluation and Treatment: Rehabilitation  THERAPY DIAG:  Chronic bilateral low back pain without sciatica  Muscle weakness (generalized)  ONSET DATE: 03/11/23  SUBJECTIVE:                                                                                                                                                                                           SUBJECTIVE STATEMENT: Patient reports continued low back pain, states that it is better than it was on Tuesday.  PERTINENT HISTORY:  Patient is a 28 year old gentleman who is seen in follow-up for L2 compression fracture.  Patient was last seen October 14.  Injury on September 15.  Patient states he still has pain in the lumbar spine without radicular symptoms.  Patient states he has pain worse after prolonged sitting.   Assessment & Plan: Visit Diagnoses:  1. Closed compression fracture of lumbosacral spine, initial encounter [facility])       Plan: Will set patient up with physical therapy.   Follow-Up Instructions: Return if symptoms worsen or fail to improve.    Ortho Exam   Patient is alert, oriented, no adenopathy, well-dressed, normal affect, normal respiratory effort. Examination patient has a normal gait.  No focal motor weakness in either lower extremity.  No radicular pain.  Previous radiographs shows a superior endplate compression fracture at L2.  PAIN:  Are you having pain? Yes: NPRS scale: 5-7/10 Pain location: low back Pain description: ache, tight Aggravating factors: position changes Relieving factors: undetermined  PRECAUTIONS: Back  RED FLAGS: None   WEIGHT BEARING RESTRICTIONS: No  FALLS:  Has patient fallen in last 6 months? No  OCCUPATION: not working  PLOF: Independent  PATIENT GOALS: To manage my low back symptoms  NEXT MD VISIT: TBD  OBJECTIVE:  Note: Objective measures were completed at Evaluation unless  otherwise noted.  DIAGNOSTIC FINDINGS:  None recent  PATIENT SURVEYS:  FOTO 37(52 predicted)   MUSCLE LENGTH: Hamstrings: Right 65 deg; Left 65 deg Thomas test: PKB unremarkable B  POSTURE:  slouched posture  PALPATION: Lumbar spring test unremarkable   LUMBAR ROM:   AROM eval  Flexion 90%  Extension 75%  Right lateral flexion 90%  Left lateral flexion 90%  Right rotation 50%  Left rotation 75%   (Blank rows = not tested)  LOWER EXTREMITY ROM:   WNL  Active  Right eval Left eval  Hip flexion    Hip extension    Hip abduction    Hip adduction    Hip internal rotation    Hip external rotation    Knee flexion    Knee extension    Ankle dorsiflexion    Ankle plantarflexion    Ankle inversion    Ankle eversion     (Blank rows = not tested)  LOWER EXTREMITY MMT:    MMT Right eval Left eval  Hip flexion    Hip extension    Hip abduction    Hip adduction    Hip internal rotation    Hip external rotation    Knee flexion    Knee extension    Ankle dorsiflexion    Ankle plantarflexion    Ankle inversion    Ankle eversion    core 3+ 3+   (Blank rows = not tested)  LUMBAR SPECIAL TESTS:  Straight leg raise test: Negative, Slump test: Negative, and FABER test: tight but no back pain  FUNCTIONAL TESTS: 5x STS 35s arms crossed  GAIT: Distance walked: 60ft x2 Assistive device utilized: None Level of assistance: Complete Independence Comments: unremarkable  TREATMENT DATE:  [facility] Adult PT Treatment:                                                DATE: 07/26/23 Therapeutic Exercise: NuStep, level 5 x 5 minutes   Sit to Stand with Arms Crossed  2x10 -  2nd set with 10# KB Sitting on dynadisk AP pelvic tilt 2x10 Supine pelvic tilt, anterior-to-posterior, x 10 with dynadisk under hips for tactile feedback; frequent verbal and visual cues required Supine Marching, 2 x 30" Supine 90/90 Abdominal Bracing 2x20" Supine "up, up, down, down" to 90/90 "march",  2x10  LTR x10   [facility] Adult PT Treatment:                                                DATE: 07/19/2023  Therapeutic Exercise: NuStep, level 3 x 5 minutes   Sit to Stand with Arms Crossed  3 x 5 Supine pelvic tilt, anterior-to-posterior, x 10 with pillow under hips for tactile feedback; frequent verbal and visual cues required Supine Marching, 2 x 10 each Supine 90/90 Abdominal Bracing  Supine "up, up, down, down" to 90/90 "march", x10    07/13/23 Eval and HEP                                                                                                                                PATIENT EDUCATION:  Education details: Discussed eval findings, rehab rationale and POC and patient is in agreement  Person educated: Patient Education method: Explanation Education comprehension: verbalized understanding and needs further education  HOME EXERCISE PROGRAM: Access Code: [email or web address] URL: [email or web address] Date: 07/19/2023 Prepared by: Mauri Reading  Exercises - Sit to Stand with Arms Crossed  - 2 x daily - 5 x weekly - 1 sets - 5 reps - Supine 90/90 Abdominal Bracing  - 2 x daily - 5 x weekly - 1 sets - 2 reps - 30s hold - Hooklying Sequential Leg March and Lower  - 1 x daily - 5 x weekly - 1 sets - 10 reps - Supine Posterior Pelvic Tilt  - 1 x daily - 5 x weekly - 1 sets - 10 reps - 3 sec hold - Static Prone on Elbows  - 2 x daily - 5 x weekly - 1 sets - 1 reps - 2 min hold  ASSESSMENT:  CLINICAL IMPRESSION: Patient presents to PT reporting continued lower back pain that is better than it was on Tuesday when he was unable to attend PT. He did well pelvic tilting today, was able to maintain better with dynadisk. Plan to incorporate more challenging core exercises at next session. Patient was able to tolerate all prescribed exercises with no adverse effects. Patient continues to benefit from skilled PT services and should be progressed as able to improve functional  independence.    OBJECTIVE IMPAIRMENTS: decreased activity tolerance, decreased endurance, decreased knowledge of condition, decreased mobility, decreased strength, increased fascial restrictions, improper body mechanics, postural dysfunction, and pain.   ACTIVITY LIMITATIONS: carrying, lifting, bending, sitting, standing, squatting, and bed mobility  PERSONAL FACTORS: Age, Fitness, Past/current experiences, and Time  since onset of injury/illness/exacerbation are also affecting patient's functional outcome.   REHAB POTENTIAL: Good  CLINICAL DECISION MAKING: Stable/uncomplicated  EVALUATION COMPLEXITY: Low   GOALS: Goals reviewed with patient? No  SHORT TERM GOALS: Target date: 08/03/2023    Patient to demonstrate independence in HEP  Baseline: KMELHHFP Goal status: INITIAL  2.  Patient will decrease 5x STS stand  from 35 to 25 with/without arms to demonstrate and improved functional ability with less pain/difficulty as well as reduce fall risk.  Baseline: 35s Goal status: INITIAL    LONG TERM GOALS: Target date: 08/24/2023    Patient will acknowledge 4/10 best pain at least once during episode of care   Baseline: 5/10 Goal status: INITIAL  2.  Patient will score at least 52% on FOTO to signify clinically meaningful improvement in functional abilities.   Baseline: 37% Goal status: INITIAL  3.  Patient will decrease 5x STS stand  from 35 to 15 with/without arms to demonstrate and improved functional ability with less pain/difficulty as well as reduce fall risk.  Baseline:  Goal status: INITIAL  4.  Increase R rotation to 75% Baseline:  AROM eval  Flexion 90%  Extension 75%  Right lateral flexion 90%  Left lateral flexion 90%  Right rotation 50%  Left rotation 75%   Goal status: INITIAL  5.  4/5 core strength Baseline: 3+/5 core strength Goal status: INITIAL    PLAN:  PT FREQUENCY: 1-2x/week  PT DURATION: 6 weeks  PLANNED INTERVENTIONS: 97164- PT  Re-evaluation, 97110-Therapeutic exercises, 97530- Therapeutic activity, 97112- Neuromuscular re-education, 97535- Self Care, 16109- Manual therapy, [id number]- Aquatic Therapy, and Dry Needling.  PLAN FOR NEXT SESSION: HEP review and update, manual techniques as appropriate, aerobic tasks, ROM and flexibility activities, strengthening and PREs, TPDN, gait and balance training as needed     Berta Minor, PTA 07/26/2023 5:04 PM

## 2023-07-31 ENCOUNTER — Ambulatory Visit: Payer: No Typology Code available for payment source | Attending: Orthopaedic Surgery

## 2023-07-31 DIAGNOSIS — G8929 Other chronic pain: Secondary | ICD-10-CM | POA: Insufficient documentation

## 2023-07-31 DIAGNOSIS — M545 Low back pain, unspecified: Secondary | ICD-10-CM | POA: Insufficient documentation

## 2023-07-31 DIAGNOSIS — M6281 Muscle weakness (generalized): Secondary | ICD-10-CM | POA: Insufficient documentation

## 2023-07-31 NOTE — Therapy (Incomplete)
OUTPATIENT PHYSICAL THERAPY NOTE   Patient Name: Dominic Bryant MRN: [id number] DOB:September 13, 1995, 28 y.o., male Today's Date: 07/31/2023  END OF SESSION:    Past Medical History:  Diagnosis Date   Depression    Heart murmur    MDD (major depressive disorder)    Suicide attempt (HCC) 03/11/2023   Jumped off a bridge   Thyroid disease    Past Surgical History:  Procedure Laterality Date   ESOPHAGOGASTRODUODENOSCOPY (EGD) WITH PROPOFOL N/A 05/12/2015   Procedure: ESOPHAGOGASTRODUODENOSCOPY (EGD) WITH PROPOFOL;  Surgeon: Willis Modena, MD;  Location: [facility];  Service: Endoscopy;  Laterality: N/A;   EUS N/A 05/12/2015   Procedure: UPPER ENDOSCOPIC ULTRASOUND (EUS) RADIAL;  Surgeon: Willis Modena, MD;  Location: [facility];  Service: Endoscopy;  Laterality: N/A;   Patient Active Problem List   Diagnosis Date Noted   Chronic cough 06/15/2023   History of suicide attempt 05/03/2023   History of arm fracture 05/03/2023   Chronic bilateral low back pain without sciatica 05/03/2023   Tobacco use 05/03/2023   Class 2 severe obesity due to excess calories with serious comorbidity and body mass index (BMI) of 36.0 to 36.9 in adult [facility]) 10/09/2022   Major depressive disorder with single episode, in partial remission (HCC) 07/01/2020   Acquired hypothyroidism 07/01/2020    PCP: Gerre Scull, NP   REFERRING PROVIDER: Nadara Mustard, MD  REFERRING DIAG: S32.000A (ICD-10-CM) - Closed compression fracture of lumbosacral spine, initial encounter [facility])  Rationale for Evaluation and Treatment: Rehabilitation  THERAPY DIAG:  No diagnosis found.  ONSET DATE: 03/11/23  SUBJECTIVE:                                                                                                                                                                                           SUBJECTIVE STATEMENT: *** Patient reports continued low back pain, states that it is better than it was on  Tuesday.    PERTINENT HISTORY:  Patient is a 28 year old gentleman who is seen in follow-up for L2 compression fracture.  Patient was last seen October 14.  Injury on September 15.  Patient states he still has pain in the lumbar spine without radicular symptoms.  Patient states he has pain worse after prolonged sitting.   Assessment & Plan: Visit Diagnoses:  1. Closed compression fracture of lumbosacral spine, initial encounter [facility])       Plan: Will set patient up with physical therapy.   Follow-Up Instructions: Return if symptoms worsen or fail to improve.    Ortho Exam   Patient is alert, oriented, no adenopathy, well-dressed, normal affect, normal respiratory effort. Examination patient  has a normal gait.  No focal motor weakness in either lower extremity.  No radicular pain.  Previous radiographs shows a superior endplate compression fracture at L2.  PAIN:  Are you having pain? Yes: NPRS scale: 5-7/10 Pain location: low back Pain description: ache, tight Aggravating factors: position changes Relieving factors: undetermined  PRECAUTIONS: Back  RED FLAGS: None   WEIGHT BEARING RESTRICTIONS: No  FALLS:  Has patient fallen in last 6 months? No  OCCUPATION: not working  PLOF: Independent  PATIENT GOALS: To manage my low back symptoms  NEXT MD VISIT: TBD  OBJECTIVE:  Note: Objective measures were completed at Evaluation unless otherwise noted.  DIAGNOSTIC FINDINGS:  None recent  PATIENT SURVEYS:  FOTO 37(52 predicted)   MUSCLE LENGTH: Hamstrings: Right 65 deg; Left 65 deg Thomas test: PKB unremarkable B  POSTURE:  slouched posture  PALPATION: Lumbar spring test unremarkable   LUMBAR ROM:   AROM eval  Flexion 90%  Extension 75%  Right lateral flexion 90%  Left lateral flexion 90%  Right rotation 50%  Left rotation 75%   (Blank rows = not tested)  LOWER EXTREMITY ROM:   WNL  Active  Right eval Left eval  Hip flexion    Hip extension     Hip abduction    Hip adduction    Hip internal rotation    Hip external rotation    Knee flexion    Knee extension    Ankle dorsiflexion    Ankle plantarflexion    Ankle inversion    Ankle eversion     (Blank rows = not tested)  LOWER EXTREMITY MMT:    MMT Right eval Left eval  Hip flexion    Hip extension    Hip abduction    Hip adduction    Hip internal rotation    Hip external rotation    Knee flexion    Knee extension    Ankle dorsiflexion    Ankle plantarflexion    Ankle inversion    Ankle eversion    core 3+ 3+   (Blank rows = not tested)  LUMBAR SPECIAL TESTS:  Straight leg raise test: Negative, Slump test: Negative, and FABER test: tight but no back pain  FUNCTIONAL TESTS: 5x STS 35s arms crossed  GAIT: Distance walked: 58ft x2 Assistive device utilized: None Level of assistance: Complete Independence Comments: unremarkable  TREATMENT DATE:  [facility] Adult PT Treatment:                                                DATE: 07/31/23 Therapeutic Exercise: NuStep, level 5 x 5 minutes   Supine Marching, 2 x 30" Supine 90/90 Abdominal Bracing 2x20" Supine "up, up, down, down" to 90/90 "march", 2x10  LTR x10 Neuromuscular re-ed: Sitting on dynadisk AP pelvic tilt 2x10 Supine pelvic tilt, anterior-to-posterior, x 10 with dynadisk under hips for tactile feedback; frequent verbal and visual cues required Bird dogs Dead bugs Therapeutic Activity: Sit to Stand with Arms Crossed  2x10 - 2nd set with 10# KB   [facility] Adult PT Treatment:                                                DATE: 07/26/23 Therapeutic Exercise:  NuStep, level 5 x 5 minutes   Sit to Stand with Arms Crossed  2x10 - 2nd set with 10# KB Sitting on dynadisk AP pelvic tilt 2x10 Supine pelvic tilt, anterior-to-posterior, x 10 with dynadisk under hips for tactile feedback; frequent verbal and visual cues required Supine Marching, 2 x 30" Supine 90/90 Abdominal Bracing 2x20" Supine "up, up, down,  down" to 90/90 "march", 2x10  LTR x10   [facility] Adult PT Treatment:                                                DATE: 07/19/2023  Therapeutic Exercise: NuStep, level 3 x 5 minutes   Sit to Stand with Arms Crossed  3 x 5 Supine pelvic tilt, anterior-to-posterior, x 10 with pillow under hips for tactile feedback; frequent verbal and visual cues required Supine Marching, 2 x 10 each Supine 90/90 Abdominal Bracing  Supine "up, up, down, down" to 90/90 "march", x10    PATIENT EDUCATION:  Education details: Discussed eval findings, rehab rationale and POC and patient is in agreement  Person educated: Patient Education method: Explanation Education comprehension: verbalized understanding and needs further education  HOME EXERCISE PROGRAM: Access Code: [email or web address] URL: [email or web address] Date: 07/19/2023 Prepared by: Mauri Reading  Exercises - Sit to Stand with Arms Crossed  - 2 x daily - 5 x weekly - 1 sets - 5 reps - Supine 90/90 Abdominal Bracing  - 2 x daily - 5 x weekly - 1 sets - 2 reps - 30s hold - Hooklying Sequential Leg March and Lower  - 1 x daily - 5 x weekly - 1 sets - 10 reps - Supine Posterior Pelvic Tilt  - 1 x daily - 5 x weekly - 1 sets - 10 reps - 3 sec hold - Static Prone on Elbows  - 2 x daily - 5 x weekly - 1 sets - 1 reps - 2 min hold  ASSESSMENT:  CLINICAL IMPRESSION: ***  Patient presents to PT reporting continued lower back pain that is better than it was on Tuesday when he was unable to attend PT. He did well pelvic tilting today, was able to maintain better with dynadisk. Plan to incorporate more challenging core exercises at next session. Patient was able to tolerate all prescribed exercises with no adverse effects. Patient continues to benefit from skilled PT services and should be progressed as able to improve functional independence.    OBJECTIVE IMPAIRMENTS: decreased activity tolerance, decreased endurance, decreased knowledge of  condition, decreased mobility, decreased strength, increased fascial restrictions, improper body mechanics, postural dysfunction, and pain.   ACTIVITY LIMITATIONS: carrying, lifting, bending, sitting, standing, squatting, and bed mobility  PERSONAL FACTORS: Age, Fitness, Past/current experiences, and Time since onset of injury/illness/exacerbation are also affecting patient's functional outcome.   REHAB POTENTIAL: Good  CLINICAL DECISION MAKING: Stable/uncomplicated  EVALUATION COMPLEXITY: Low   GOALS: Goals reviewed with patient? No  SHORT TERM GOALS: Target date: 08/03/2023    Patient to demonstrate independence in HEP  Baseline: KMELHHFP Goal status: INITIAL  2.  Patient will decrease 5x STS stand  from 35 to 25 with/without arms to demonstrate and improved functional ability with less pain/difficulty as well as reduce fall risk.  Baseline: 35s Goal status: INITIAL    LONG TERM GOALS: Target date: 08/24/2023    Patient will acknowledge 4/10 best pain at least once  during episode of care   Baseline: 5/10 Goal status: INITIAL  2.  Patient will score at least 52% on FOTO to signify clinically meaningful improvement in functional abilities.   Baseline: 37% Goal status: INITIAL  3.  Patient will decrease 5x STS stand  from 35 to 15 with/without arms to demonstrate and improved functional ability with less pain/difficulty as well as reduce fall risk.  Baseline:  Goal status: INITIAL  4.  Increase R rotation to 75% Baseline:  AROM eval  Flexion 90%  Extension 75%  Right lateral flexion 90%  Left lateral flexion 90%  Right rotation 50%  Left rotation 75%   Goal status: INITIAL  5.  4/5 core strength Baseline: 3+/5 core strength Goal status: INITIAL    PLAN:  PT FREQUENCY: 1-2x/week  PT DURATION: 6 weeks  PLANNED INTERVENTIONS: 97164- PT Re-evaluation, 97110-Therapeutic exercises, 97530- Therapeutic activity, 97112- Neuromuscular re-education, 97535- Self  Care, 16109- Manual therapy, [id number]- Aquatic Therapy, and Dry Needling.  PLAN FOR NEXT SESSION: HEP review and update, manual techniques as appropriate, aerobic tasks, ROM and flexibility activities, strengthening and PREs, TPDN, gait and balance training as needed     Berta Minor, PTA 07/31/2023 8:42 AM

## 2023-08-02 ENCOUNTER — Ambulatory Visit: Payer: No Typology Code available for payment source

## 2023-08-02 DIAGNOSIS — G8929 Other chronic pain: Secondary | ICD-10-CM | POA: Diagnosis present

## 2023-08-02 DIAGNOSIS — M545 Low back pain, unspecified: Secondary | ICD-10-CM | POA: Diagnosis present

## 2023-08-02 DIAGNOSIS — M6281 Muscle weakness (generalized): Secondary | ICD-10-CM | POA: Diagnosis present

## 2023-08-02 NOTE — Therapy (Signed)
 OUTPATIENT PHYSICAL THERAPY NOTE   Patient Name: Dominic Bryant MRN: [id number] DOB:04-04-1996, 28 y.o., male Today's Date: 08/02/2023  END OF SESSION:  PT End of Session - 08/02/23 1615     Visit Number 4    Number of Visits 12    Date for PT Re-Evaluation 09/10/23    Authorization Type [employer]    Authorization Time Period 12 visits 07/16/23-09/01/23    Authorization - Visit Number 3    Authorization - Number of Visits 12    PT Start Time 1615    PT Stop Time 1654    PT Time Calculation (min) 39 min    Activity Tolerance Patient tolerated treatment well    Behavior During Therapy [facility] for tasks assessed/performed             Past Medical History:  Diagnosis Date   Depression    Heart murmur    MDD (major depressive disorder)    Suicide attempt (HCC) 03/11/2023   Jumped off a bridge   Thyroid  disease    Past Surgical History:  Procedure Laterality Date   ESOPHAGOGASTRODUODENOSCOPY (EGD) WITH PROPOFOL  N/A 05/12/2015   Procedure: ESOPHAGOGASTRODUODENOSCOPY (EGD) WITH PROPOFOL ;  Surgeon: Elsie Cree, MD;  [facility];  Service: Endoscopy;  Laterality: N/A;   EUS N/A 05/12/2015   Procedure: UPPER ENDOSCOPIC ULTRASOUND (EUS) RADIAL;  Surgeon: Elsie Cree, MD;  Location: [facility];  Service: Endoscopy;  Laterality: N/A;   Patient Active Problem List   Diagnosis Date Noted   Chronic cough 06/15/2023   History of suicide attempt 05/03/2023   History of arm fracture 05/03/2023   Chronic bilateral low back pain without sciatica 05/03/2023   Tobacco use 05/03/2023   Class 2 severe obesity due to excess calories with serious comorbidity and body mass index (BMI) of 36.0 to 36.9 in adult [facility]) 10/09/2022   Major depressive disorder with single episode, in partial remission (HCC) 07/01/2020   Acquired hypothyroidism 07/01/2020    PCP: Nedra Tinnie LABOR, NP   REFERRING PROVIDER: Harden Jerona GAILS, MD  REFERRING DIAG: S32.000A (ICD-10-CM) - Closed  compression fracture of lumbosacral spine, initial encounter [facility])  Rationale for Evaluation and Treatment: Rehabilitation  THERAPY DIAG:  Chronic bilateral low back pain without sciatica  Muscle weakness (generalized)  ONSET DATE: 03/11/23  SUBJECTIVE:                                                                                                                                                                                           SUBJECTIVE STATEMENT: Patient reports continued back pain, difficult to describe aggravating factors.    PERTINENT HISTORY:  Patient  is a 28 year old gentleman who is seen in follow-up for L2 compression fracture.  Patient was last seen October 14.  Injury on September 15.  Patient states he still has pain in the lumbar spine without radicular symptoms.  Patient states he has pain worse after prolonged sitting.   Assessment & Plan: Visit Diagnoses:  1. Closed compression fracture of lumbosacral spine, initial encounter [facility])       Plan: Will set patient up with physical therapy.   Follow-Up Instructions: Return if symptoms worsen or fail to improve.    Ortho Exam   Patient is alert, oriented, no adenopathy, well-dressed, normal affect, normal respiratory effort. Examination patient has a normal gait.  No focal motor weakness in either lower extremity.  No radicular pain.  Previous radiographs shows a superior endplate compression fracture at L2.  PAIN:  Are you having pain? Yes: NPRS scale: 5-7/10 Pain location: low back Pain description: ache, tight Aggravating factors: position changes Relieving factors: undetermined  PRECAUTIONS: Back  RED FLAGS: None   WEIGHT BEARING RESTRICTIONS: No  FALLS:  Has patient fallen in last 6 months? No  OCCUPATION: not working  PLOF: Independent  PATIENT GOALS: To manage my low back symptoms  NEXT MD VISIT: TBD  OBJECTIVE:  Note: Objective measures were completed at Evaluation unless otherwise  noted.  DIAGNOSTIC FINDINGS:  None recent  PATIENT SURVEYS:  FOTO 37(52 predicted)   MUSCLE LENGTH: Hamstrings: Right 65 deg; Left 65 deg Thomas test: PKB unremarkable B  POSTURE:  slouched posture  PALPATION: Lumbar spring test unremarkable   LUMBAR ROM:   AROM eval  Flexion 90%  Extension 75%  Right lateral flexion 90%  Left lateral flexion 90%  Right rotation 50%  Left rotation 75%   (Blank rows = not tested)  LOWER EXTREMITY ROM:   WNL  Active  Right eval Left eval  Hip flexion    Hip extension    Hip abduction    Hip adduction    Hip internal rotation    Hip external rotation    Knee flexion    Knee extension    Ankle dorsiflexion    Ankle plantarflexion    Ankle inversion    Ankle eversion     (Blank rows = not tested)  LOWER EXTREMITY MMT:    MMT Right eval Left eval  Hip flexion    Hip extension    Hip abduction    Hip adduction    Hip internal rotation    Hip external rotation    Knee flexion    Knee extension    Ankle dorsiflexion    Ankle plantarflexion    Ankle inversion    Ankle eversion    core 3+ 3+   (Blank rows = not tested)  LUMBAR SPECIAL TESTS:  Straight leg raise test: Negative, Slump test: Negative, and FABER test: tight but no back pain  FUNCTIONAL TESTS: 5x STS 35s arms crossed  GAIT: Distance walked: 56ft x2 Assistive device utilized: None Level of assistance: Complete Independence Comments: unremarkable  TREATMENT DATE:  [facility] Adult PT Treatment:                                                DATE: 08/02/23 Therapeutic Exercise: NuStep, level 5 x 5 minutes   Supine 90/90 Abdominal Bracing 2x20 Supine 90/90 heel taps, 2x10 BIL LTR x10  Neuromuscular re-ed: Sitting on dynadisk AP pelvic tilt 2x10 Supine pelvic tilt, anterior-to-posterior, x 10 with dynadisk under hips for tactile feedback; frequent verbal and visual cues required Bird dogs x5 BIL (pain in elbow) Dead bugs 2x10 Therapeutic Activity: Sit  to Stand  2x10 - 15# KB   [facility] Adult PT Treatment:                                                DATE: 07/26/23 Therapeutic Exercise: NuStep, level 5 x 5 minutes   Sit to Stand with Arms Crossed  2x10 - 2nd set with 10# KB Sitting on dynadisk AP pelvic tilt 2x10 Supine pelvic tilt, anterior-to-posterior, x 10 with dynadisk under hips for tactile feedback; frequent verbal and visual cues required Supine Marching, 2 x 30 Supine 90/90 Abdominal Bracing 2x20 Supine up, up, down, down to 90/90 march, 2x10  LTR x10   [facility] Adult PT Treatment:                                                DATE: 07/19/2023  Therapeutic Exercise: NuStep, level 3 x 5 minutes   Sit to Stand with Arms Crossed  3 x 5 Supine pelvic tilt, anterior-to-posterior, x 10 with pillow under hips for tactile feedback; frequent verbal and visual cues required Supine Marching, 2 x 10 each Supine 90/90 Abdominal Bracing  Supine up, up, down, down to 90/90 march, x10    PATIENT EDUCATION:  Education details: Discussed eval findings, rehab rationale and POC and patient is in agreement  Person educated: Patient Education method: Explanation Education comprehension: verbalized understanding and needs further education  HOME EXERCISE PROGRAM: Access Code: [email or web address] URL: [email or web address] Date: 07/19/2023 Prepared by: Marko Molt  Exercises - Sit to Stand with Arms Crossed  - 2 x daily - 5 x weekly - 1 sets - 5 reps - Supine 90/90 Abdominal Bracing  - 2 x daily - 5 x weekly - 1 sets - 2 reps - 30s hold - Hooklying Sequential Leg March and Lower  - 1 x daily - 5 x weekly - 1 sets - 10 reps - Supine Posterior Pelvic Tilt  - 1 x daily - 5 x weekly - 1 sets - 10 reps - 3 sec hold - Static Prone on Elbows  - 2 x daily - 5 x weekly - 1 sets - 1 reps - 2 min hold  ASSESSMENT:  CLINICAL IMPRESSION: Patient presents to PT reporting continued varied lower back pain. Session today continued to focus  on lumbar mobility and neutral spine core strengthening. He did well with increased challenge today, but plan to DC bird dogs as these cause pain in his elbow. Patient was able to tolerate all prescribed exercises with no adverse effects. Patient continues to benefit from skilled PT services and should be progressed as able to improve functional independence.   OBJECTIVE IMPAIRMENTS: decreased activity tolerance, decreased endurance, decreased knowledge of condition, decreased mobility, decreased strength, increased fascial restrictions, improper body mechanics, postural dysfunction, and pain.   ACTIVITY LIMITATIONS: carrying, lifting, bending, sitting, standing, squatting, and bed mobility  PERSONAL FACTORS: Age, Fitness, Past/current experiences, and Time since onset of injury/illness/exacerbation are also affecting patient's functional outcome.   REHAB POTENTIAL:  Good  CLINICAL DECISION MAKING: Stable/uncomplicated  EVALUATION COMPLEXITY: Low   GOALS: Goals reviewed with patient? No  SHORT TERM GOALS: Target date: 08/03/2023    Patient to demonstrate independence in HEP  Baseline: KMELHHFP Goal status: INITIAL  2.  Patient will decrease 5x STS stand  from 35 to 25 with/without arms to demonstrate and improved functional ability with less pain/difficulty as well as reduce fall risk.  Baseline: 35s Goal status: INITIAL    LONG TERM GOALS: Target date: 08/24/2023    Patient will acknowledge 4/10 best pain at least once during episode of care   Baseline: 5/10 Goal status: INITIAL  2.  Patient will score at least 52% on FOTO to signify clinically meaningful improvement in functional abilities.   Baseline: 37% Goal status: INITIAL  3.  Patient will decrease 5x STS stand  from 35 to 15 with/without arms to demonstrate and improved functional ability with less pain/difficulty as well as reduce fall risk.  Baseline:  Goal status: INITIAL  4.  Increase R rotation to 75% Baseline:   AROM eval  Flexion 90%  Extension 75%  Right lateral flexion 90%  Left lateral flexion 90%  Right rotation 50%  Left rotation 75%   Goal status: INITIAL  5.  4/5 core strength Baseline: 3+/5 core strength Goal status: INITIAL    PLAN:  PT FREQUENCY: 1-2x/week  PT DURATION: 6 weeks  PLANNED INTERVENTIONS: 97164- PT Re-evaluation, 97110-Therapeutic exercises, 97530- Therapeutic activity, 97112- Neuromuscular re-education, 97535- Self Care, [id number]- Manual therapy, [id number]- Aquatic Therapy, and Dry Needling.  PLAN FOR NEXT SESSION: HEP review and update, manual techniques as appropriate, aerobic tasks, ROM and flexibility activities, strengthening and PREs, TPDN, gait and balance training as needed     Corean Pouch, PTA 08/02/2023 4:55 PM

## 2023-08-03 ENCOUNTER — Ambulatory Visit: Payer: No Typology Code available for payment source | Admitting: Nurse Practitioner

## 2023-08-07 ENCOUNTER — Ambulatory Visit: Payer: No Typology Code available for payment source

## 2023-08-07 DIAGNOSIS — G8929 Other chronic pain: Secondary | ICD-10-CM

## 2023-08-07 DIAGNOSIS — M6281 Muscle weakness (generalized): Secondary | ICD-10-CM

## 2023-08-07 DIAGNOSIS — M545 Low back pain, unspecified: Secondary | ICD-10-CM | POA: Diagnosis not present

## 2023-08-07 NOTE — Therapy (Addendum)
 OUTPATIENT PHYSICAL THERAPY NOTE/DISCHARGE SUMMARY   Patient Name: Dominic Bryant MRN: [id number] DOB:12/05/1995, 28 y.o., male Today's Date: 10/03/2023  PHYSICAL THERAPY DISCHARGE SUMMARY  Visits from Start of Care: 5  Current functional level related to goals / functional outcomes: UTA   Remaining deficits: UTA   Education / Equipment: HEP   Patient agrees to discharge. Patient goals were partially met. Patient is being discharged due to not returning since the last visit.     Past Medical History:  Diagnosis Date   Depression    Heart murmur    MDD (major depressive disorder)    Suicide attempt (HCC) 03/11/2023   Jumped off a bridge   Thyroid disease    Past Surgical History:  Procedure Laterality Date   ESOPHAGOGASTRODUODENOSCOPY (EGD) WITH PROPOFOL N/A 05/12/2015   Procedure: ESOPHAGOGASTRODUODENOSCOPY (EGD) WITH PROPOFOL;  Surgeon: Willis Modena, MD;  Location: [facility];  Service: Endoscopy;  Laterality: N/A;   EUS N/A 05/12/2015   Procedure: UPPER ENDOSCOPIC ULTRASOUND (EUS) RADIAL;  Surgeon: Willis Modena, MD;  Location: [facility];  Service: Endoscopy;  Laterality: N/A;   Patient Active Problem List   Diagnosis Date Noted   Chronic cough 06/15/2023   History of suicide attempt 05/03/2023   History of arm fracture 05/03/2023   Chronic bilateral low back pain without sciatica 05/03/2023   Tobacco use 05/03/2023   Class 2 severe obesity due to excess calories with serious comorbidity and body mass index (BMI) of 36.0 to 36.9 in adult [facility]) 10/09/2022   Major depressive disorder with single episode, in partial remission (HCC) 07/01/2020   Acquired hypothyroidism 07/01/2020    PCP: Gerre Scull, NP   REFERRING PROVIDER: Nadara Mustard, MD  REFERRING DIAG: S32.000A (ICD-10-CM) - Closed compression fracture of lumbosacral spine, initial encounter [facility])  Rationale for Evaluation and Treatment: Rehabilitation  THERAPY DIAG:  Chronic bilateral  low back pain without sciatica  Muscle weakness (generalized)  ONSET DATE: 03/11/23  SUBJECTIVE:                                                                                                                                                                                           SUBJECTIVE STATEMENT: Patient reports that his back pain continues.   PERTINENT HISTORY:  Patient is a 28 year old gentleman who is seen in follow-up for L2 compression fracture.  Patient was last seen October 14.  Injury on September 15.  Patient states he still has pain in the lumbar spine without radicular symptoms.  Patient states he has pain worse after prolonged sitting.   Assessment & Plan: Visit Diagnoses:  1. Closed compression fracture of  lumbosacral spine, initial encounter [facility])       Plan: Will set patient up with physical therapy.   Follow-Up Instructions: Return if symptoms worsen or fail to improve.    Ortho Exam   Patient is alert, oriented, no adenopathy, well-dressed, normal affect, normal respiratory effort. Examination patient has a normal gait.  No focal motor weakness in either lower extremity.  No radicular pain.  Previous radiographs shows a superior endplate compression fracture at L2.  PAIN:  Are you having pain? Yes: NPRS scale: 5-7/10 Pain location: low back Pain description: ache, tight Aggravating factors: position changes Relieving factors: undetermined  PRECAUTIONS: Back  RED FLAGS: None   WEIGHT BEARING RESTRICTIONS: No  FALLS:  Has patient fallen in last 6 months? No  OCCUPATION: not working  PLOF: Independent  PATIENT GOALS: To manage my low back symptoms  NEXT MD VISIT: TBD  OBJECTIVE:  Note: Objective measures were completed at Evaluation unless otherwise noted.  DIAGNOSTIC FINDINGS:  None recent  PATIENT SURVEYS:  FOTO 37(52 predicted)   MUSCLE LENGTH: Hamstrings: Right 65 deg; Left 65 deg Thomas test: PKB unremarkable B  POSTURE:   slouched posture  PALPATION: Lumbar spring test unremarkable   LUMBAR ROM:   AROM eval  Flexion 90%  Extension 75%  Right lateral flexion 90%  Left lateral flexion 90%  Right rotation 50%  Left rotation 75%   (Blank rows = not tested)  LOWER EXTREMITY ROM:   WNL  Active  Right eval Left eval  Hip flexion    Hip extension    Hip abduction    Hip adduction    Hip internal rotation    Hip external rotation    Knee flexion    Knee extension    Ankle dorsiflexion    Ankle plantarflexion    Ankle inversion    Ankle eversion     (Blank rows = not tested)  LOWER EXTREMITY MMT:    MMT Right eval Left eval  Hip flexion    Hip extension    Hip abduction    Hip adduction    Hip internal rotation    Hip external rotation    Knee flexion    Knee extension    Ankle dorsiflexion    Ankle plantarflexion    Ankle inversion    Ankle eversion    core 3+ 3+   (Blank rows = not tested)  LUMBAR SPECIAL TESTS:  Straight leg raise test: Negative, Slump test: Negative, and FABER test: tight but no back pain  FUNCTIONAL TESTS: 5x STS 35s arms crossed  GAIT: Distance walked: 22ft x2 Assistive device utilized: None Level of assistance: Complete Independence Comments: unremarkable  TREATMENT DATE:  [facility] Adult PT Treatment:                                                DATE: 08/07/23 Therapeutic Exercise: Elliptical x 5 mins Supine 90/90 Abdominal Bracing 2x30" Supine 90/90 heel taps, 2x10 BIL LTR x10 Neuromuscular re-ed: Sitting on dynadisk AP pelvic tilt 2x10 Sitting on pball AP pelvic tilt to alternating marching 2x10 Dead bugs with pilates ring 2x5 Supine 90/90 hold with arms clasped, perturbations in multiple directions to UE and LE 2x30"   [facility] Adult PT Treatment:  DATE: 08/02/23 Therapeutic Exercise: NuStep, level 5 x 5 minutes   Supine 90/90 Abdominal Bracing 2x20" Supine 90/90 heel taps, 2x10 BIL LTR  x10 Neuromuscular re-ed: Sitting on dynadisk AP pelvic tilt 2x10 Supine pelvic tilt, anterior-to-posterior, x 10 with dynadisk under hips for tactile feedback; frequent verbal and visual cues required Bird dogs x5 BIL (pain in elbow) Dead bugs 2x10 Therapeutic Activity: Sit to Stand  2x10 - 15# KB   [facility] Adult PT Treatment:                                                DATE: 07/26/23 Therapeutic Exercise: NuStep, level 5 x 5 minutes   Sit to Stand with Arms Crossed  2x10 - 2nd set with 10# KB Sitting on dynadisk AP pelvic tilt 2x10 Supine pelvic tilt, anterior-to-posterior, x 10 with dynadisk under hips for tactile feedback; frequent verbal and visual cues required Supine Marching, 2 x 30" Supine 90/90 Abdominal Bracing 2x20" Supine "up, up, down, down" to 90/90 "march", 2x10  LTR x10   PATIENT EDUCATION:  Education details: Discussed eval findings, rehab rationale and POC and patient is in agreement  Person educated: Patient Education method: Explanation Education comprehension: verbalized understanding and needs further education  HOME EXERCISE PROGRAM: Access Code: [email or web address] URL: [email or web address] Date: 07/19/2023 Prepared by: Mauri Reading  Exercises - Sit to Stand with Arms Crossed  - 2 x daily - 5 x weekly - 1 sets - 5 reps - Supine 90/90 Abdominal Bracing  - 2 x daily - 5 x weekly - 1 sets - 2 reps - 30s hold - Hooklying Sequential Leg March and Lower  - 1 x daily - 5 x weekly - 1 sets - 10 reps - Supine Posterior Pelvic Tilt  - 1 x daily - 5 x weekly - 1 sets - 10 reps - 3 sec hold - Static Prone on Elbows  - 2 x daily - 5 x weekly - 1 sets - 1 reps - 2 min hold  ASSESSMENT:  CLINICAL IMPRESSION: Patient presents to PT reporting continued lower back pain. Session today focused on core strengthening and lumbar mobility. Patient was able to tolerate all prescribed exercises with no adverse effects. Patient continues to benefit from skilled PT  services and should be progressed as able to improve functional independence.    OBJECTIVE IMPAIRMENTS: decreased activity tolerance, decreased endurance, decreased knowledge of condition, decreased mobility, decreased strength, increased fascial restrictions, improper body mechanics, postural dysfunction, and pain.   ACTIVITY LIMITATIONS: carrying, lifting, bending, sitting, standing, squatting, and bed mobility  PERSONAL FACTORS: Age, Fitness, Past/current experiences, and Time since onset of injury/illness/exacerbation are also affecting patient's functional outcome.   REHAB POTENTIAL: Good  CLINICAL DECISION MAKING: Stable/uncomplicated  EVALUATION COMPLEXITY: Low   GOALS: Goals reviewed with patient? No  SHORT TERM GOALS: Target date: 08/03/2023    Patient to demonstrate independence in HEP  Baseline: KMELHHFP Goal status: INITIAL  2.  Patient will decrease 5x STS stand  from 35 to 25 with/without arms to demonstrate and improved functional ability with less pain/difficulty as well as reduce fall risk.  Baseline: 35s Goal status: INITIAL    LONG TERM GOALS: Target date: 08/24/2023    Patient will acknowledge 4/10 best pain at least once during episode of care   Baseline: 5/10 Goal status: INITIAL  2.  Patient will score  at least 52% on FOTO to signify clinically meaningful improvement in functional abilities.   Baseline: 37% Goal status: INITIAL  3.  Patient will decrease 5x STS stand  from 35 to 15 with/without arms to demonstrate and improved functional ability with less pain/difficulty as well as reduce fall risk.  Baseline:  Goal status: INITIAL  4.  Increase R rotation to 75% Baseline:  AROM eval  Flexion 90%  Extension 75%  Right lateral flexion 90%  Left lateral flexion 90%  Right rotation 50%  Left rotation 75%   Goal status: INITIAL  5.  4/5 core strength Baseline: 3+/5 core strength Goal status: INITIAL    PLAN:  PT FREQUENCY:  1-2x/week  PT DURATION: 6 weeks  PLANNED INTERVENTIONS: 97164- PT Re-evaluation, 97110-Therapeutic exercises, 97530- Therapeutic activity, 97112- Neuromuscular re-education, 97535- Self Care, 16109- Manual therapy, [id number]- Aquatic Therapy, and Dry Needling.  PLAN FOR NEXT SESSION: HEP review and update, manual techniques as appropriate, aerobic tasks, ROM and flexibility activities, strengthening and PREs, TPDN, gait and balance training as needed     Berta Minor, PTA 10/03/2023 5:01 PM

## 2023-08-09 ENCOUNTER — Ambulatory Visit: Payer: No Typology Code available for payment source

## 2023-08-09 NOTE — Therapy (Incomplete)
OUTPATIENT PHYSICAL THERAPY NOTE   Patient Name: Dominic Bryant MRN: [id number] DOB:05/22/1996, 28 y.o., male Today's Date: 08/09/2023  END OF SESSION:    Past Medical History:  Diagnosis Date   Depression    Heart murmur    MDD (major depressive disorder)    Suicide attempt (HCC) 03/11/2023   Jumped off a bridge   Thyroid disease    Past Surgical History:  Procedure Laterality Date   ESOPHAGOGASTRODUODENOSCOPY (EGD) WITH PROPOFOL N/A 05/12/2015   Procedure: ESOPHAGOGASTRODUODENOSCOPY (EGD) WITH PROPOFOL;  Surgeon: Willis Modena, MD;  Location: [facility];  Service: Endoscopy;  Laterality: N/A;   EUS N/A 05/12/2015   Procedure: UPPER ENDOSCOPIC ULTRASOUND (EUS) RADIAL;  Surgeon: Willis Modena, MD;  Location: [facility];  Service: Endoscopy;  Laterality: N/A;   Patient Active Problem List   Diagnosis Date Noted   Chronic cough 06/15/2023   History of suicide attempt 05/03/2023   History of arm fracture 05/03/2023   Chronic bilateral low back pain without sciatica 05/03/2023   Tobacco use 05/03/2023   Class 2 severe obesity due to excess calories with serious comorbidity and body mass index (BMI) of 36.0 to 36.9 in adult [facility]) 10/09/2022   Major depressive disorder with single episode, in partial remission (HCC) 07/01/2020   Acquired hypothyroidism 07/01/2020    PCP: Gerre Scull, NP   REFERRING PROVIDER: Nadara Mustard, MD  REFERRING DIAG: S32.000A (ICD-10-CM) - Closed compression fracture of lumbosacral spine, initial encounter [facility])  Rationale for Evaluation and Treatment: Rehabilitation  THERAPY DIAG:  No diagnosis found.  ONSET DATE: 03/11/23  SUBJECTIVE:                                                                                                                                                                                           SUBJECTIVE STATEMENT: *** Patient reports that his back pain continues.   PERTINENT HISTORY:  Patient is a  28 year old gentleman who is seen in follow-up for L2 compression fracture.  Patient was last seen October 14.  Injury on September 15.  Patient states he still has pain in the lumbar spine without radicular symptoms.  Patient states he has pain worse after prolonged sitting.   Assessment & Plan: Visit Diagnoses:  1. Closed compression fracture of lumbosacral spine, initial encounter [facility])       Plan: Will set patient up with physical therapy.   Follow-Up Instructions: Return if symptoms worsen or fail to improve.    Ortho Exam   Patient is alert, oriented, no adenopathy, well-dressed, normal affect, normal respiratory effort. Examination patient has a normal gait.  No focal motor weakness in  either lower extremity.  No radicular pain.  Previous radiographs shows a superior endplate compression fracture at L2.  PAIN:  Are you having pain? Yes: NPRS scale: 5-7/10 Pain location: low back Pain description: ache, tight Aggravating factors: position changes Relieving factors: undetermined  PRECAUTIONS: Back  RED FLAGS: None   WEIGHT BEARING RESTRICTIONS: No  FALLS:  Has patient fallen in last 6 months? No  OCCUPATION: not working  PLOF: Independent  PATIENT GOALS: To manage my low back symptoms  NEXT MD VISIT: TBD  OBJECTIVE:  Note: Objective measures were completed at Evaluation unless otherwise noted.  DIAGNOSTIC FINDINGS:  None recent  PATIENT SURVEYS:  FOTO 37(52 predicted)   MUSCLE LENGTH: Hamstrings: Right 65 deg; Left 65 deg Thomas test: PKB unremarkable B  POSTURE:  slouched posture  PALPATION: Lumbar spring test unremarkable   LUMBAR ROM:   AROM eval  Flexion 90%  Extension 75%  Right lateral flexion 90%  Left lateral flexion 90%  Right rotation 50%  Left rotation 75%   (Blank rows = not tested)  LOWER EXTREMITY ROM:   WNL  Active  Right eval Left eval  Hip flexion    Hip extension    Hip abduction    Hip adduction    Hip internal  rotation    Hip external rotation    Knee flexion    Knee extension    Ankle dorsiflexion    Ankle plantarflexion    Ankle inversion    Ankle eversion     (Blank rows = not tested)  LOWER EXTREMITY MMT:    MMT Right eval Left eval  Hip flexion    Hip extension    Hip abduction    Hip adduction    Hip internal rotation    Hip external rotation    Knee flexion    Knee extension    Ankle dorsiflexion    Ankle plantarflexion    Ankle inversion    Ankle eversion    core 3+ 3+   (Blank rows = not tested)  LUMBAR SPECIAL TESTS:  Straight leg raise test: Negative, Slump test: Negative, and FABER test: tight but no back pain  FUNCTIONAL TESTS: 5x STS 35s arms crossed  GAIT: Distance walked: 31ft x2 Assistive device utilized: None Level of assistance: Complete Independence Comments: unremarkable  TREATMENT DATE:  [facility] Adult PT Treatment:                                                DATE: 08/09/23 Therapeutic Exercise: Elliptical x 5 mins Supine 90/90 Abdominal Bracing 2x30" Supine 90/90 heel taps, 2x10 BIL LTR x10 Neuromuscular re-ed: Sitting on dynadisk AP pelvic tilt 2x10 Sitting on pball AP pelvic tilt to alternating marching 2x10 Dead bugs with pilates ring 2x5 Supine 90/90 hold with arms clasped, perturbations in multiple directions to UE and LE 2x30"   [facility] Adult PT Treatment:                                                DATE: 08/07/23 Therapeutic Exercise: Elliptical x 5 mins Supine 90/90 Abdominal Bracing 2x30" Supine 90/90 heel taps, 2x10 BIL LTR x10 Neuromuscular re-ed: Sitting on dynadisk AP pelvic tilt 2x10 Sitting on pball AP pelvic  tilt to alternating marching 2x10 Dead bugs with pilates ring 2x5 Supine 90/90 hold with arms clasped, perturbations in multiple directions to UE and LE 2x30"   [facility] Adult PT Treatment:                                                DATE: 08/02/23 Therapeutic Exercise: NuStep, level 5 x 5 minutes   Supine 90/90  Abdominal Bracing 2x20" Supine 90/90 heel taps, 2x10 BIL LTR x10 Neuromuscular re-ed: Sitting on dynadisk AP pelvic tilt 2x10 Supine pelvic tilt, anterior-to-posterior, x 10 with dynadisk under hips for tactile feedback; frequent verbal and visual cues required Bird dogs x5 BIL (pain in elbow) Dead bugs 2x10 Therapeutic Activity: Sit to Stand  2x10 - 15# KB   PATIENT EDUCATION:  Education details: Discussed eval findings, rehab rationale and POC and patient is in agreement  Person educated: Patient Education method: Explanation Education comprehension: verbalized understanding and needs further education  HOME EXERCISE PROGRAM: Access Code: [email or web address] URL: [email or web address] Date: 07/19/2023 Prepared by: Mauri Reading  Exercises - Sit to Stand with Arms Crossed  - 2 x daily - 5 x weekly - 1 sets - 5 reps - Supine 90/90 Abdominal Bracing  - 2 x daily - 5 x weekly - 1 sets - 2 reps - 30s hold - Hooklying Sequential Leg March and Lower  - 1 x daily - 5 x weekly - 1 sets - 10 reps - Supine Posterior Pelvic Tilt  - 1 x daily - 5 x weekly - 1 sets - 10 reps - 3 sec hold - Static Prone on Elbows  - 2 x daily - 5 x weekly - 1 sets - 1 reps - 2 min hold  ASSESSMENT:  CLINICAL IMPRESSION: ***  Patient presents to PT reporting continued lower back pain. Session today focused on core strengthening and lumbar mobility. Patient was able to tolerate all prescribed exercises with no adverse effects. Patient continues to benefit from skilled PT services and should be progressed as able to improve functional independence.    OBJECTIVE IMPAIRMENTS: decreased activity tolerance, decreased endurance, decreased knowledge of condition, decreased mobility, decreased strength, increased fascial restrictions, improper body mechanics, postural dysfunction, and pain.   ACTIVITY LIMITATIONS: carrying, lifting, bending, sitting, standing, squatting, and bed mobility  PERSONAL FACTORS:  Age, Fitness, Past/current experiences, and Time since onset of injury/illness/exacerbation are also affecting patient's functional outcome.   REHAB POTENTIAL: Good  CLINICAL DECISION MAKING: Stable/uncomplicated  EVALUATION COMPLEXITY: Low   GOALS: Goals reviewed with patient? No  SHORT TERM GOALS: Target date: 08/03/2023    Patient to demonstrate independence in HEP  Baseline: KMELHHFP Goal status: INITIAL  2.  Patient will decrease 5x STS stand  from 35 to 25 with/without arms to demonstrate and improved functional ability with less pain/difficulty as well as reduce fall risk.  Baseline: 35s Goal status: INITIAL    LONG TERM GOALS: Target date: 08/24/2023    Patient will acknowledge 4/10 best pain at least once during episode of care   Baseline: 5/10 Goal status: INITIAL  2.  Patient will score at least 52% on FOTO to signify clinically meaningful improvement in functional abilities.   Baseline: 37% Goal status: INITIAL  3.  Patient will decrease 5x STS stand  from 35 to 15 with/without arms to demonstrate and improved functional ability with less pain/difficulty as  well as reduce fall risk.  Baseline:  Goal status: INITIAL  4.  Increase R rotation to 75% Baseline:  AROM eval  Flexion 90%  Extension 75%  Right lateral flexion 90%  Left lateral flexion 90%  Right rotation 50%  Left rotation 75%   Goal status: INITIAL  5.  4/5 core strength Baseline: 3+/5 core strength Goal status: INITIAL    PLAN:  PT FREQUENCY: 1-2x/week  PT DURATION: 6 weeks  PLANNED INTERVENTIONS: 97164- PT Re-evaluation, 97110-Therapeutic exercises, 97530- Therapeutic activity, 97112- Neuromuscular re-education, 97535- Self Care, 14782- Manual therapy, [id number]- Aquatic Therapy, and Dry Needling.  PLAN FOR NEXT SESSION: HEP review and update, manual techniques as appropriate, aerobic tasks, ROM and flexibility activities, strengthening and PREs, TPDN, gait and balance training as  needed     Berta Minor, PTA 08/09/2023 2:02 PM

## 2023-08-14 ENCOUNTER — Telehealth: Payer: Self-pay

## 2023-08-14 ENCOUNTER — Ambulatory Visit: Payer: No Typology Code available for payment source

## 2023-08-14 NOTE — Therapy (Incomplete)
OUTPATIENT PHYSICAL THERAPY NOTE   Patient Name: Dominic Bryant MRN: [id number] DOB:Oct 23, 1995, 28 y.o., male Today's Date: 08/14/2023  END OF SESSION:    Past Medical History:  Diagnosis Date   Depression    Heart murmur    MDD (major depressive disorder)    Suicide attempt (HCC) 03/11/2023   Jumped off a bridge   Thyroid disease    Past Surgical History:  Procedure Laterality Date   ESOPHAGOGASTRODUODENOSCOPY (EGD) WITH PROPOFOL N/A 05/12/2015   Procedure: ESOPHAGOGASTRODUODENOSCOPY (EGD) WITH PROPOFOL;  Surgeon: Willis Modena, MD;  Location: [facility];  Service: Endoscopy;  Laterality: N/A;   EUS N/A 05/12/2015   Procedure: UPPER ENDOSCOPIC ULTRASOUND (EUS) RADIAL;  Surgeon: Willis Modena, MD;  Location: [facility];  Service: Endoscopy;  Laterality: N/A;   Patient Active Problem List   Diagnosis Date Noted   Chronic cough 06/15/2023   History of suicide attempt 05/03/2023   History of arm fracture 05/03/2023   Chronic bilateral low back pain without sciatica 05/03/2023   Tobacco use 05/03/2023   Class 2 severe obesity due to excess calories with serious comorbidity and body mass index (BMI) of 36.0 to 36.9 in adult [facility]) 10/09/2022   Major depressive disorder with single episode, in partial remission (HCC) 07/01/2020   Acquired hypothyroidism 07/01/2020    PCP: Gerre Scull, NP   REFERRING PROVIDER: Nadara Mustard, MD  REFERRING DIAG: S32.000A (ICD-10-CM) - Closed compression fracture of lumbosacral spine, initial encounter [facility])  Rationale for Evaluation and Treatment: Rehabilitation  THERAPY DIAG:  No diagnosis found.  ONSET DATE: 03/11/23  SUBJECTIVE:                                                                                                                                                                                           SUBJECTIVE STATEMENT: *** Patient reports that his back pain continues.   PERTINENT HISTORY:  Patient is a  27 year old gentleman who is seen in follow-up for L2 compression fracture.  Patient was last seen October 14.  Injury on September 15.  Patient states he still has pain in the lumbar spine without radicular symptoms.  Patient states he has pain worse after prolonged sitting.   Assessment & Plan: Visit Diagnoses:  1. Closed compression fracture of lumbosacral spine, initial encounter [facility])       Plan: Will set patient up with physical therapy.   Follow-Up Instructions: Return if symptoms worsen or fail to improve.    Ortho Exam   Patient is alert, oriented, no adenopathy, well-dressed, normal affect, normal respiratory effort. Examination patient has a normal gait.  No focal motor weakness in  either lower extremity.  No radicular pain.  Previous radiographs shows a superior endplate compression fracture at L2.  PAIN:  Are you having pain? Yes: NPRS scale: 5-7/10 Pain location: low back Pain description: ache, tight Aggravating factors: position changes Relieving factors: undetermined  PRECAUTIONS: Back  RED FLAGS: None   WEIGHT BEARING RESTRICTIONS: No  FALLS:  Has patient fallen in last 6 months? No  OCCUPATION: not working  PLOF: Independent  PATIENT GOALS: To manage my low back symptoms  NEXT MD VISIT: TBD  OBJECTIVE:  Note: Objective measures were completed at Evaluation unless otherwise noted.  DIAGNOSTIC FINDINGS:  None recent  PATIENT SURVEYS:  FOTO 37(52 predicted)   MUSCLE LENGTH: Hamstrings: Right 65 deg; Left 65 deg Thomas test: PKB unremarkable B  POSTURE:  slouched posture  PALPATION: Lumbar spring test unremarkable   LUMBAR ROM:   AROM eval  Flexion 90%  Extension 75%  Right lateral flexion 90%  Left lateral flexion 90%  Right rotation 50%  Left rotation 75%   (Blank rows = not tested)  LOWER EXTREMITY ROM:   WNL  Active  Right eval Left eval  Hip flexion    Hip extension    Hip abduction    Hip adduction    Hip internal  rotation    Hip external rotation    Knee flexion    Knee extension    Ankle dorsiflexion    Ankle plantarflexion    Ankle inversion    Ankle eversion     (Blank rows = not tested)  LOWER EXTREMITY MMT:    MMT Right eval Left eval  Hip flexion    Hip extension    Hip abduction    Hip adduction    Hip internal rotation    Hip external rotation    Knee flexion    Knee extension    Ankle dorsiflexion    Ankle plantarflexion    Ankle inversion    Ankle eversion    core 3+ 3+   (Blank rows = not tested)  LUMBAR SPECIAL TESTS:  Straight leg raise test: Negative, Slump test: Negative, and FABER test: tight but no back pain  FUNCTIONAL TESTS: 5x STS 35s arms crossed  GAIT: Distance walked: 94ft x2 Assistive device utilized: None Level of assistance: Complete Independence Comments: unremarkable  TREATMENT DATE:  [facility] Adult PT Treatment:                                                DATE: 08/14/23 Therapeutic Exercise: Elliptical x 5 mins Supine 90/90 Abdominal Bracing 2x30" Supine 90/90 heel taps, 2x10 BIL LTR x10 Neuromuscular re-ed: Sitting on dynadisk AP pelvic tilt 2x10 Sitting on pball AP pelvic tilt to alternating marching 2x10 Dead bugs with pilates ring 2x5 Supine 90/90 hold with arms clasped, perturbations in multiple directions to UE and LE 2x30"   [facility] Adult PT Treatment:                                                DATE: 08/07/23 Therapeutic Exercise: Elliptical x 5 mins Supine 90/90 Abdominal Bracing 2x30" Supine 90/90 heel taps, 2x10 BIL LTR x10 Neuromuscular re-ed: Sitting on dynadisk AP pelvic tilt 2x10 Sitting on pball AP pelvic  tilt to alternating marching 2x10 Dead bugs with pilates ring 2x5 Supine 90/90 hold with arms clasped, perturbations in multiple directions to UE and LE 2x30"   [facility] Adult PT Treatment:                                                DATE: 08/02/23 Therapeutic Exercise: NuStep, level 5 x 5 minutes   Supine 90/90  Abdominal Bracing 2x20" Supine 90/90 heel taps, 2x10 BIL LTR x10 Neuromuscular re-ed: Sitting on dynadisk AP pelvic tilt 2x10 Supine pelvic tilt, anterior-to-posterior, x 10 with dynadisk under hips for tactile feedback; frequent verbal and visual cues required Bird dogs x5 BIL (pain in elbow) Dead bugs 2x10 Therapeutic Activity: Sit to Stand  2x10 - 15# KB   PATIENT EDUCATION:  Education details: Discussed eval findings, rehab rationale and POC and patient is in agreement  Person educated: Patient Education method: Explanation Education comprehension: verbalized understanding and needs further education  HOME EXERCISE PROGRAM: Access Code: [email or web address] URL: [email or web address] Date: 07/19/2023 Prepared by: Mauri Reading  Exercises - Sit to Stand with Arms Crossed  - 2 x daily - 5 x weekly - 1 sets - 5 reps - Supine 90/90 Abdominal Bracing  - 2 x daily - 5 x weekly - 1 sets - 2 reps - 30s hold - Hooklying Sequential Leg March and Lower  - 1 x daily - 5 x weekly - 1 sets - 10 reps - Supine Posterior Pelvic Tilt  - 1 x daily - 5 x weekly - 1 sets - 10 reps - 3 sec hold - Static Prone on Elbows  - 2 x daily - 5 x weekly - 1 sets - 1 reps - 2 min hold  ASSESSMENT:  CLINICAL IMPRESSION: ***  Patient presents to PT reporting continued lower back pain. Session today focused on core strengthening and lumbar mobility. Patient was able to tolerate all prescribed exercises with no adverse effects. Patient continues to benefit from skilled PT services and should be progressed as able to improve functional independence.    OBJECTIVE IMPAIRMENTS: decreased activity tolerance, decreased endurance, decreased knowledge of condition, decreased mobility, decreased strength, increased fascial restrictions, improper body mechanics, postural dysfunction, and pain.   ACTIVITY LIMITATIONS: carrying, lifting, bending, sitting, standing, squatting, and bed mobility  PERSONAL FACTORS:  Age, Fitness, Past/current experiences, and Time since onset of injury/illness/exacerbation are also affecting patient's functional outcome.   REHAB POTENTIAL: Good  CLINICAL DECISION MAKING: Stable/uncomplicated  EVALUATION COMPLEXITY: Low   GOALS: Goals reviewed with patient? No  SHORT TERM GOALS: Target date: 08/03/2023    Patient to demonstrate independence in HEP  Baseline: [email or web address] Goal status: INITIAL  2.  Patient will decrease 5x STS stand  from 35 to 25 with/without arms to demonstrate and improved functional ability with less pain/difficulty as well as reduce fall risk.  Baseline: 35s Goal status: INITIAL    LONG TERM GOALS: Target date: 08/24/2023    Patient will acknowledge 4/10 best pain at least once during episode of care   Baseline: 5/10 Goal status: INITIAL  2.  Patient will score at least 52% on FOTO to signify clinically meaningful improvement in functional abilities.   Baseline: 37% Goal status: INITIAL  3.  Patient will decrease 5x STS stand  from 35 to 15 with/without arms to demonstrate and improved functional ability with less pain/difficulty as  well as reduce fall risk.  Baseline:  Goal status: INITIAL  4.  Increase R rotation to 75% Baseline:  AROM eval  Flexion 90%  Extension 75%  Right lateral flexion 90%  Left lateral flexion 90%  Right rotation 50%  Left rotation 75%   Goal status: INITIAL  5.  4/5 core strength Baseline: 3+/5 core strength Goal status: INITIAL    PLAN:  PT FREQUENCY: 1-2x/week  PT DURATION: 6 weeks  PLANNED INTERVENTIONS: 97164- PT Re-evaluation, 97110-Therapeutic exercises, 97530- Therapeutic activity, 97112- Neuromuscular re-education, 97535- Self Care, [address]- Manual therapy, [id number]- Aquatic Therapy, and Dry Needling.  PLAN FOR NEXT SESSION: HEP review and update, manual techniques as appropriate, aerobic tasks, ROM and flexibility activities, strengthening and PREs, TPDN, gait and balance training as  needed     Berta Minor, PTA 08/14/2023 12:29 PM

## 2023-08-14 NOTE — Telephone Encounter (Signed)
Attempted to call patient regarding missed appointment. No VM available. Since this is the 3rd incurred no show and patient has also incurred several later cancellations, he will be DC from PT at this time and will need a new referral to return.  Berta Minor, PTA 08/14/23 4:39 PM

## 2023-08-30 ENCOUNTER — Ambulatory Visit: Payer: No Typology Code available for payment source | Admitting: Nurse Practitioner

## 2023-08-30 ENCOUNTER — Encounter: Payer: Self-pay | Admitting: Nurse Practitioner

## 2023-08-30 VITALS — BP 106/78 | HR 68 | Temp 97.6°F | Ht 67.0 in | Wt 227.6 lb

## 2023-08-30 DIAGNOSIS — F324 Major depressive disorder, single episode, in partial remission: Secondary | ICD-10-CM

## 2023-08-30 DIAGNOSIS — M545 Low back pain, unspecified: Secondary | ICD-10-CM

## 2023-08-30 DIAGNOSIS — G8929 Other chronic pain: Secondary | ICD-10-CM

## 2023-08-30 MED ORDER — QUETIAPINE FUMARATE ER 300 MG PO TB24: 300.0000 mg | ORAL_TABLET | Freq: Every day | ORAL | 1 refills | Status: DC

## 2023-08-30 NOTE — Assessment & Plan Note (Signed)
 Chronic, not controlled. He experiences significant depressive symptoms, including mood fluctuations, insomnia, crying spells, and passive suicidal ideation. Currently on the maximum dose of Zoloft 200mg  daily, will increase in Seroquel to 300 mg at bedtime is planned. He will continue Zoloft at 200 mg daily. A referral to psychiatry is necessary for further management. Information for behavioral health urgent care has been provided.

## 2023-08-30 NOTE — Assessment & Plan Note (Signed)
 He suffers from significant back pain radiating to the right leg, with current Celebrex treatment proving ineffective. A referral to a back specialist is planned for further evaluation and management options. He states he has finished PT.

## 2023-08-30 NOTE — Progress Notes (Signed)
 Established Patient Office Visit  Subjective   Patient ID: Dominic Bryant, male    DOB: November 26, 1995  Age: 28 y.o. MRN: [id number]  Chief Complaint  Patient presents with   Depression    Follow up, request an increase in Rx of Zoloft    HPI  Discussed the use of AI scribe software for clinical note transcription with the patient, who gave verbal consent to proceed.  History of Present Illness   The patient, with a history of depression and back pain, presents with worsening depressive symptoms. He describes feeling 'real down' and having 'a lot of ups and downs.' He has been having trouble falling asleep and staying asleep. He also reports experiencing anxiety, but not to a significant degree. He has been taking his medication, Seroquel 200mg  daily and zoloft 200mg  daily. He has been referred to psychiatry but has not yet started seeing a therapist. He has been experiencing crying spells and thoughts of self-harm, but denies having any serious plans to act on these thoughts.  In addition to his mental health concerns, the patient reports experiencing shortness of breath two to three times a week, typically when walking. These episodes last for about two to three minutes. He also reports having a lot of back pain, which is present in all areas of his back and is described as an aching pain. The pain is exacerbated by laying down and sitting for long periods of time. He is currently taking Celebrex for pain management, but reports that it is not providing significant relief.        08/30/2023    1:23 PM 07/09/2023    3:25 PM 06/14/2023    4:38 PM 05/03/2023    2:16 PM 10/09/2022    9:25 AM  Depression screen PHQ 2/9  Decreased Interest 3 2 1 3 1   Down, Depressed, Hopeless 3 3 3 3 2   PHQ - 2 Score 6 5 4 6 3   Altered sleeping 3 3 3 3 3   Tired, decreased energy 3 1 2 2  0  Change in appetite 3 2 2 1 2   Feeling bad or failure about yourself  3 3 3 3 1   Trouble concentrating 3 1 3 2 2   Moving  slowly or fidgety/restless 3 1 2 2  0  Suicidal thoughts 3 3 3 3 2   PHQ-9 Score 27 19 22 22 13   Difficult doing work/chores  Very difficult Very difficult Very difficult Somewhat difficult      08/30/2023    1:24 PM 07/09/2023    3:25 PM 06/14/2023    4:38 PM 05/03/2023    2:16 PM  GAD 7 : Generalized Anxiety Score  Nervous, Anxious, on Edge 3 3 3 2   Control/stop worrying 3 1 3 3   Worry too much - different things 3 2 3 3   Trouble relaxing 3 1 2 3   Restless 3 3 3 2   Easily annoyed or irritable 3 3 2 1   Afraid - awful might happen 3 3 3 2   Total GAD 7 Score 21 16 19 16   Anxiety Difficulty  Very difficult Extremely difficult Somewhat difficult     ROS See pertinent positives and negatives per HPI.    Objective:     BP 106/78 (BP Location: Left Arm, Patient Position: Sitting, Cuff Size: Normal)   Pulse 68   Temp 97.6 F (36.4 C)   Ht 5\' 7"  (1.702 m)   Wt 227 lb 9.6 oz (103.2 kg)   SpO2 99%  BMI 35.65 kg/m    Physical Exam Vitals and nursing note reviewed.  Constitutional:      Appearance: Normal appearance.  HENT:     Head: Normocephalic.  Eyes:     Conjunctiva/sclera: Conjunctivae normal.  Cardiovascular:     Rate and Rhythm: Normal rate and regular rhythm.     Pulses: Normal pulses.     Heart sounds: Normal heart sounds.  Pulmonary:     Effort: Pulmonary effort is normal.     Breath sounds: Normal breath sounds.  Musculoskeletal:        General: Tenderness (midline lumbar spine) present. No swelling. Normal range of motion.     Cervical back: Normal range of motion.  Skin:    General: Skin is warm.  Neurological:     General: No focal deficit present.     Mental Status: He is alert and oriented to person, place, and time.  Psychiatric:        Mood and Affect: Mood normal. Affect is flat and tearful.        Behavior: Behavior is withdrawn.        Thought Content: Thought content normal. Thought content does not include suicidal plan.        Judgment:  Judgment normal.      Assessment & Plan:   Problem List Items Addressed This Visit       Other   Major depressive disorder with single episode, in partial remission (HCC) - Primary   Chronic, not controlled. He experiences significant depressive symptoms, including mood fluctuations, insomnia, crying spells, and passive suicidal ideation. Currently on the maximum dose of Zoloft 200mg  daily, will increase in Seroquel to 300 mg at bedtime is planned. He will continue Zoloft at 200 mg daily. A referral to psychiatry is necessary for further management. Information for behavioral health urgent care has been provided.       Chronic bilateral low back pain without sciatica   He suffers from significant back pain radiating to the right leg, with current Celebrex treatment proving ineffective. A referral to a back specialist is planned for further evaluation and management options. He states he has finished PT.        Return in about 4 weeks (around 09/27/2023) for 4-6 weeks , Depression.    Gerre Scull, NP

## 2023-08-30 NOTE — Patient Instructions (Addendum)
 It was great to see you!  Increase your seroquel to 300mg  at bedtime  Keep taking zoloft 200mg  daily  Call psychiatry to schedule an appointment  Call the back specialist to schedule an appointment   [facility] is open 24/7 and is a walk-in urgent care  [facility] [address] [place], [place] [address] [phone]  Let's follow-up in 4-6 weeks, sooner if you have concerns.  If a referral was placed today, you will be contacted for an appointment. Please note that routine referrals can sometimes take up to 3-4 weeks to process. Please call our office if you haven't heard anything after this time frame.  Take care,  Rodman Pickle, NP

## 2023-09-11 ENCOUNTER — Institutional Professional Consult (permissible substitution): Payer: No Typology Code available for payment source | Admitting: Pulmonary Disease

## 2023-09-11 ENCOUNTER — Encounter: Payer: Self-pay | Admitting: Pulmonary Disease

## 2023-09-12 ENCOUNTER — Ambulatory Visit: Payer: No Typology Code available for payment source | Admitting: Nurse Practitioner

## 2023-09-14 ENCOUNTER — Encounter: Payer: Self-pay | Admitting: Physician Assistant

## 2023-09-14 ENCOUNTER — Ambulatory Visit: Admitting: Physician Assistant

## 2023-09-14 ENCOUNTER — Other Ambulatory Visit (INDEPENDENT_AMBULATORY_CARE_PROVIDER_SITE_OTHER): Payer: Self-pay

## 2023-09-14 DIAGNOSIS — M545 Low back pain, unspecified: Secondary | ICD-10-CM

## 2023-09-14 DIAGNOSIS — G8929 Other chronic pain: Secondary | ICD-10-CM | POA: Diagnosis not present

## 2023-09-14 NOTE — Progress Notes (Signed)
 Office Visit Note   Patient: Dominic Bryant           Date of Birth: April 02, 1996           MRN: [id number] Visit Date: 09/14/2023              Requested by: Gerre Scull, NP [address] [place],  [place] [address] PCP: Gerre Scull, NP   Assessment & Plan: Visit Diagnoses:  1. Chronic bilateral low back pain without sciatica     Plan: Patient is a pleasant 28 year old gentleman who is approximately 6 months status post fall onto his back from about 10 to 15 feet.  Since then he has had ongoing pain in the right mid back.  X-rays have demonstrated a compression fracture of L2.  He has been taking Celebrex at night which helps a little bit.  He is also had recent physical therapy but he continues to struggle.  Says sometimes the right leg feels a little weak and he gets cramping.  Does go down his leg occasionally.  Exam today most pain is focal in the paravertebral area in the mid back.  No tenderness over the vertebral bodies.  Considering he did have a traumatic injury it has been 6 months without improvement and conservative treatment including physical therapy and Celebrex would recommend an MRI and referral to Dr. Christell Constant after MRI is complete  Follow-Up Instructions: No follow-ups on file.   Orders:  Orders Placed This Encounter  Procedures   XR Lumbar Spine 2-3 Views   MR Lumbar Spine w/o contrast   Ambulatory referral to Orthopedic Surgery   No orders of the defined types were placed in this encounter.     Procedures: No procedures performed   Clinical Data: No additional findings.   Subjective: Chief Complaint  Patient presents with   Lower Back - Pain    Patient been having this pain now for 5-6 months  lower back pain.  To stand the right leg feels like its cramping, when laying his back becomes stiff, he is taking celebrex for pain  it wakes him at night and when laying he has a twitch     HPI patient is a 28 year old gentleman with a chief  complaint of low back pain more on the right he says he gets cramping down the right leg when he lays back he becomes stiff this has been going on for 5 to 6 months he has been taking Celebrex for pain pain wakes him up at night  Review of Systems  All other systems reviewed and are negative.    Objective: Vital Signs: There were no vitals taken for this visit.  Physical Exam Constitutional:      Appearance: Normal appearance.  Pulmonary:     Effort: Pulmonary effort is normal.  Skin:    General: Skin is warm and dry.  Neurological:     General: No focal deficit present.     Mental Status: He is alert and oriented to person, place, and time.  Psychiatric:        Mood and Affect: Mood normal.        Behavior: Behavior normal.     Ortho Exam Examination of his spine he has no step-offs he does not have any tenderness particularly over this vertebral bodies does have pain in the right paravertebral area in the mid back.  Strength is intact.  No paresthesias today.  Has some pain with flexion and extension  Specialty Comments:  No specialty comments available.  Imaging: XR Lumbar Spine 2-3 Views Result Date: 09/14/2023 2 view radiographs of his lumbar spine demonstrate well-maintained alignment no specific listhesis overall well-maintained joint spacing .  He does have a superior endplate fracture compression at L2    PMFS History: Patient Active Problem List   Diagnosis Date Noted   Chronic cough 06/15/2023   History of suicide attempt 05/03/2023   History of arm fracture 05/03/2023   Chronic bilateral low back pain without sciatica 05/03/2023   Tobacco use 05/03/2023   Class 2 severe obesity due to excess calories with serious comorbidity and body mass index (BMI) of 36.0 to 36.9 in adult [facility]) 10/09/2022   Major depressive disorder with single episode, in partial remission (HCC) 07/01/2020   Acquired hypothyroidism 07/01/2020   Past Medical History:  Diagnosis Date    Depression    Heart murmur    MDD (major depressive disorder)    Suicide attempt (HCC) 03/11/2023   Jumped off a bridge   Thyroid disease     Family History  Problem Relation Age of Onset   Crohn's disease Mother    Asthma Father    Alcohol abuse Father     Past Surgical History:  Procedure Laterality Date   ESOPHAGOGASTRODUODENOSCOPY (EGD) WITH PROPOFOL N/A 05/12/2015   Procedure: ESOPHAGOGASTRODUODENOSCOPY (EGD) WITH PROPOFOL;  Surgeon: Willis Modena, MD;  Location: [facility];  Service: Endoscopy;  Laterality: N/A;   EUS N/A 05/12/2015   Procedure: UPPER ENDOSCOPIC ULTRASOUND (EUS) RADIAL;  Surgeon: Willis Modena, MD;  Location: [facility];  Service: Endoscopy;  Laterality: N/A;   Social History   Occupational History   Not on file  Tobacco Use   Smoking status: Every Day    Current packs/day: 0.50    Average packs/day: 0.5 packs/day for 8.2 years (4.1 ttl pk-yrs)    Types: Cigarettes    Start date: 2017   Smokeless tobacco: Never  Vaping Use   Vaping status: Never Used  Substance and Sexual Activity   Alcohol use: Yes    Comment: social   Drug use: Yes    Types: Marijuana   Sexual activity: Yes    Partners: Female    Birth control/protection: Condom

## 2023-09-27 ENCOUNTER — Ambulatory Visit: Admitting: Orthopedic Surgery

## 2023-09-27 ENCOUNTER — Encounter: Admitting: Orthopedic Surgery

## 2023-10-04 ENCOUNTER — Ambulatory Visit: Admitting: Nurse Practitioner

## 2023-10-05 ENCOUNTER — Other Ambulatory Visit

## 2023-10-06 DIAGNOSIS — Z419 Encounter for procedure for purposes other than remedying health state, unspecified: Secondary | ICD-10-CM | POA: Diagnosis not present

## 2023-10-09 ENCOUNTER — Telehealth: Payer: Self-pay | Admitting: Nurse Practitioner

## 2023-10-09 NOTE — Telephone Encounter (Signed)
 10/04/2023 same day cancel via mychart, letter sent via [facility]

## 2023-10-10 ENCOUNTER — Ambulatory Visit: Admitting: Orthopedic Surgery

## 2023-10-10 ENCOUNTER — Other Ambulatory Visit (INDEPENDENT_AMBULATORY_CARE_PROVIDER_SITE_OTHER): Payer: Self-pay

## 2023-10-10 VITALS — BP 102/71 | Ht 67.0 in | Wt 228.0 lb

## 2023-10-10 DIAGNOSIS — G8929 Other chronic pain: Secondary | ICD-10-CM | POA: Diagnosis not present

## 2023-10-10 DIAGNOSIS — M545 Low back pain, unspecified: Secondary | ICD-10-CM

## 2023-10-10 DIAGNOSIS — Z720 Tobacco use: Secondary | ICD-10-CM

## 2023-10-10 NOTE — Progress Notes (Signed)
 Orthopedic Spine Surgery Office Note  Assessment: Patient is a 28 y.o. male with low back pain that radiates into the right anterior lateral thigh, possible radiculopathy.  He has a likely 3 column injury to his L1 vertebra which was treated nonoperatively.  No evidence of instability on today's flexion-extension views.   Plan: -Patient already has an MRI ordered.  I told him to follow through on that as that will help [place] evaluate for radiculopathy -Patient has tried: PT, Celebrex -He can use Tylenol up to 1000 mg 3 times a day for pain relief -Patient should follow-up after his MRI to go over the results and decide on next steps in treatment  I spent going over smoking cessation with him. I explained that there are multiple health risks of smoking. One in particular to his case would be decreased bony healing. From a surgical standpoint, smokers have a higher rate of infections and nonunion.   ___________________________________________________________________________   History:  Patient is a 28 y.o. male who presents today for lumbar spine.  Patient states he jumped off a bridge in September 2024.  He sustained a compression fracture at that time.  He has had pain in his back since then.  He was also has pain rating into his right anterior lateral thigh.  He does not have any pain rating past the knee on that side.  No pain rating to the left lower extremity.  He has tried physical therapy but did not notice any relief with that.  He is also done Celebrex but that did not help him either.  He feels the pain on a daily basis.  He notices the pain with activity and at rest.  Pain is generally worse with activity though.   Weakness: Denies Symptoms of imbalance: Denies Paresthesias and numbness: Denies Bowel or bladder incontinence: Denies Saddle anesthesia: Denies  Treatments tried: PT, Celebrex  Review of systems: Denies fevers and chills, night sweats, unexplained weight loss,  history of cancer, pain that wakes him at night  Past medical history: Depression/anxiety Migraines  Allergies: NKDA  Past surgical history:  None  Social history: Reports use of nicotine product (smoking, vaping, patches, smokeless) Alcohol use: Yes, approximately 3-4 drinks per week Reports marijuana use, denies other recreational drug use   Physical Exam:  BMI of 35.7  General: no acute distress, appears stated age Neurologic: alert, answering questions appropriately, following commands Respiratory: unlabored breathing on room air, symmetric chest rise Psychiatric: appropriate affect, normal cadence to speech   MSK (spine):  -Strength exam      Left  Right EHL    5/5  5/5 TA    5/5  5/5 GSC    5/5  5/5 Knee extension  5/5  5/5 Hip flexion   5/5  5/5  -Sensory exam    Sensation intact to light touch in L3-S1 nerve distributions of bilateral lower extremities  Imaging: XRs of the lumbar spine from 10/10/2023 were independently reviewed and interpreted, showing wedge deformity with anterior height loss at L1 and L2 consistent with compression fracture.  No other fracture seen.  No dislocation seen.  No significant degenerative changes within the lumbar spine.  CT of the lumbar spine from 03/11/2023 was independently reviewed and interpreted, showing anterior height loss with wedge deformity at L1 and L2.  At L1 there are bilateral fractures through the pars and there is a fracture through the spinous process as well.  These findings were not seen on CT abdomen from 04/02/2015.  No other fracture seen.   Patient name: Dominic Bryant Patient MRN: [id number] Date of visit: 10/10/23 ar

## 2023-10-11 ENCOUNTER — Ambulatory Visit (INDEPENDENT_AMBULATORY_CARE_PROVIDER_SITE_OTHER): Admitting: Nurse Practitioner

## 2023-10-11 ENCOUNTER — Encounter: Payer: Self-pay | Admitting: Nurse Practitioner

## 2023-10-11 VITALS — BP 102/60 | HR 82 | Temp 97.8°F | Ht 67.0 in | Wt 226.4 lb

## 2023-10-11 DIAGNOSIS — F324 Major depressive disorder, single episode, in partial remission: Secondary | ICD-10-CM

## 2023-10-11 DIAGNOSIS — E039 Hypothyroidism, unspecified: Secondary | ICD-10-CM | POA: Diagnosis not present

## 2023-10-11 DIAGNOSIS — M545 Low back pain, unspecified: Secondary | ICD-10-CM | POA: Diagnosis not present

## 2023-10-11 DIAGNOSIS — R7301 Impaired fasting glucose: Secondary | ICD-10-CM | POA: Diagnosis not present

## 2023-10-11 DIAGNOSIS — G8929 Other chronic pain: Secondary | ICD-10-CM

## 2023-10-11 LAB — HEMOGLOBIN A1C: Hgb A1c MFr Bld: 4.4 % — ABNORMAL LOW (ref 4.6–6.5)

## 2023-10-11 LAB — BASIC METABOLIC PANEL WITH GFR
BUN: 6 mg/dL (ref 6–23)
CO2: 27 meq/L (ref 19–32)
Calcium: 8.8 mg/dL (ref 8.4–10.5)
Chloride: 106 meq/L (ref 96–112)
Creatinine, Ser: 0.92 mg/dL (ref 0.40–1.50)
GFR: 114.69 mL/min (ref >60.00)
Glucose, Bld: 77 mg/dL (ref 70–99)
Potassium: 4 meq/L (ref 3.5–5.1)
Sodium: 137 meq/L (ref 135–145)

## 2023-10-11 LAB — TSH: TSH: 5.88 u[IU]/mL — ABNORMAL HIGH (ref 0.35–5.50)

## 2023-10-11 LAB — T4, FREE: Free T4: 0.6 ng/dL (ref 0.60–1.60)

## 2023-10-11 NOTE — Progress Notes (Signed)
 Established Patient Office Visit  Subjective   Patient ID: Dominic Bryant, male    DOB: 1995/10/06  Age: 28 y.o. MRN: [id number]  Chief Complaint  Patient presents with   Depression    Follow up, no concerns    HPI  Discussed the use of AI scribe software for clinical note transcription with the patient, who gave verbal consent to proceed.  History of Present Illness   The patient, with a history of depression, back pain, and thyroid disease, presents for follow-up. He reports ongoing depression, despite taking Seroquel 200mg  daily and zoloft 200mg  daily. He has not increased the dose of seroquel as previously discussed. He continues to struggle with sleep and has had recent suicidal thoughts, the most recent being yesterday. He denies having a plan. He has not yet scheduled an appointment with a psychiatrist, despite recommendations to do so.   He is also following up with an orthopedic specialist for back pain, but an MRI has not yet been scheduled. He had repeat x-ray which shows ongoing compression fracture in his lower back.        10/11/2023    1:25 PM 08/30/2023    1:23 PM 07/09/2023    3:25 PM 06/14/2023    4:38 PM 05/03/2023    2:16 PM  Depression screen PHQ 2/9  Decreased Interest 1 3 2 1 3   Down, Depressed, Hopeless 3 3 3 3 3   PHQ - 2 Score 4 6 5 4 6   Altered sleeping 3 3 3 3 3   Tired, decreased energy 3 3 1 2 2   Change in appetite 3 3 2 2 1   Feeling bad or failure about yourself  2 3 3 3 3   Trouble concentrating 3 3 1 3 2   Moving slowly or fidgety/restless 2 3 1 2 2   Suicidal thoughts 3 3 3 3 3   PHQ-9 Score 23 27 19 22 22   Difficult doing work/chores Very difficult  Very difficult Very difficult Very difficult      10/11/2023    1:25 PM 08/30/2023    1:24 PM 07/09/2023    3:25 PM 06/14/2023    4:38 PM  GAD 7 : Generalized Anxiety Score  Nervous, Anxious, on Edge 3 3 3 3   Control/stop worrying 3 3 1 3   Worry too much - different things 3 3 2 3   Trouble  relaxing 3 3 1 2   Restless 3 3 3 3   Easily annoyed or irritable 3 3 3 2   Afraid - awful might happen 3 3 3 3   Total GAD 7 Score 21 21 16 19   Anxiety Difficulty Extremely difficult  Very difficult Extremely difficult     ROS See pertinent positives and negatives per HPI.    Objective:     BP 102/60 (BP Location: Left Arm, Patient Position: Sitting, Cuff Size: Normal)   Pulse 82   Temp 97.8 F (36.6 C)   Ht 5\' 7"  (1.702 m)   Wt 226 lb 6.4 oz (102.7 kg)   SpO2 98%   BMI 35.46 kg/m    Physical Exam Vitals and nursing note reviewed.  Constitutional:      Appearance: Normal appearance.  HENT:     Head: Normocephalic.  Eyes:     Conjunctiva/sclera: Conjunctivae normal.  Cardiovascular:     Rate and Rhythm: Normal rate and regular rhythm.     Pulses: Normal pulses.     Heart sounds: Normal heart sounds.  Pulmonary:     Effort: Pulmonary effort  is normal.     Breath sounds: Normal breath sounds.  Musculoskeletal:     Cervical back: Normal range of motion.  Skin:    General: Skin is warm.  Neurological:     General: No focal deficit present.     Mental Status: He is alert and oriented to person, place, and time.  Psychiatric:        Mood and Affect: Mood normal.        Behavior: Behavior normal.        Thought Content: Thought content normal.        Judgment: Judgment normal.      Assessment & Plan:   Problem List Items Addressed This Visit       Endocrine   Acquired hypothyroidism - Primary   Elevated thyroid levels from a year ago may contribute to his symptoms. Check TSH and free T4 today.       Relevant Orders   TSH   T4, free     Other   Major depressive disorder with single episode, in partial remission (HCC)   Chronic, not controlled. He experiences persistent depression with recent suicidal thoughts but has no active suicidal plan. He has not complied with the increased Seroquel dosage or scheduled a psychiatry appointment. Encouraged him to  schedule the appointment and discussed the importance of increasing the Seroquel dosage. Continue zoloft 200mg  daily and seroquel 300mg  daily.       Chronic bilateral low back pain without sciatica   He suffers from chronic back pain with lumbar compression fracture and is under orthopedic care. An MRI was ordered but not yet scheduled. He should follow up with the orthopedic specialist regarding MRI scheduling.       Other Visit Diagnoses       IFG (impaired fasting glucose)       Check BMP and A1c today   Relevant Orders   Basic metabolic panel with GFR   Hemoglobin A1c       Return in about 3 months (around 01/10/2024) for Depression.    Odette Benjamin, NP

## 2023-10-11 NOTE — Patient Instructions (Signed)
 It was great to see you!  We are checking your labs today and will let you know the results via mychart/phone.   Call the psychiatrist office as soon as you are able to schedule an appointment.   Let's follow-up in 3 months, sooner if you have concerns.  If a referral was placed today, you will be contacted for an appointment. Please note that routine referrals can sometimes take up to 3-4 weeks to process. Please call our office if you haven't heard anything after this time frame.  Take care,  Rheba Cedar, NP

## 2023-10-11 NOTE — Assessment & Plan Note (Signed)
 Elevated thyroid levels from a year ago may contribute to his symptoms. Check TSH and free T4 today.

## 2023-10-11 NOTE — Assessment & Plan Note (Signed)
 Chronic, not controlled. He experiences persistent depression with recent suicidal thoughts but has no active suicidal plan. He has not complied with the increased Seroquel dosage or scheduled a psychiatry appointment. Encouraged him to schedule the appointment and discussed the importance of increasing the Seroquel dosage. Continue zoloft 200mg  daily and seroquel 300mg  daily.

## 2023-10-11 NOTE — Assessment & Plan Note (Signed)
 He suffers from chronic back pain with lumbar compression fracture and is under orthopedic care. An MRI was ordered but not yet scheduled. He should follow up with the orthopedic specialist regarding MRI scheduling.

## 2023-10-15 NOTE — Telephone Encounter (Signed)
 Sent corrected letter via mychart  06/12/2023 & 10/04/2023 missed visits

## 2023-10-15 NOTE — Telephone Encounter (Signed)
 Noted.

## 2023-10-19 DIAGNOSIS — F333 Major depressive disorder, recurrent, severe with psychotic symptoms: Secondary | ICD-10-CM | POA: Diagnosis not present

## 2023-10-19 DIAGNOSIS — F17210 Nicotine dependence, cigarettes, uncomplicated: Secondary | ICD-10-CM | POA: Diagnosis not present

## 2023-10-19 DIAGNOSIS — F411 Generalized anxiety disorder: Secondary | ICD-10-CM | POA: Diagnosis not present

## 2023-10-19 DIAGNOSIS — F4312 Post-traumatic stress disorder, chronic: Secondary | ICD-10-CM | POA: Diagnosis not present

## 2023-11-05 DIAGNOSIS — Z419 Encounter for procedure for purposes other than remedying health state, unspecified: Secondary | ICD-10-CM | POA: Diagnosis not present

## 2023-11-11 ENCOUNTER — Encounter: Payer: Self-pay | Admitting: Nurse Practitioner

## 2023-11-15 ENCOUNTER — Encounter: Payer: Self-pay | Admitting: Orthopedic Surgery

## 2023-12-30 ENCOUNTER — Emergency Department (HOSPITAL_COMMUNITY)
Admission: EM | Admit: 2023-12-30 | Discharge: 2023-12-31 | Disposition: A | Discharge status: Home / Self Care | Attending: Emergency Medicine | Admitting: Emergency Medicine

## 2023-12-30 ENCOUNTER — Encounter (HOSPITAL_COMMUNITY): Payer: Self-pay

## 2023-12-30 ENCOUNTER — Other Ambulatory Visit: Payer: Self-pay

## 2023-12-30 DIAGNOSIS — R1013 Epigastric pain: Secondary | ICD-10-CM | POA: Insufficient documentation

## 2023-12-30 DIAGNOSIS — F17200 Nicotine dependence, unspecified, uncomplicated: Secondary | ICD-10-CM | POA: Diagnosis not present

## 2023-12-30 DIAGNOSIS — R197 Diarrhea, unspecified: Secondary | ICD-10-CM | POA: Diagnosis present

## 2023-12-30 LAB — COMPREHENSIVE METABOLIC PANEL WITH GFR
ALT: 21 U/L (ref 0–44)
AST: 24 U/L (ref 15–41)
Albumin: 3.8 g/dL (ref 3.5–5.0)
Alkaline Phosphatase: 52 U/L (ref 38–126)
Anion gap: 9 (ref 5–15)
BUN: 6 mg/dL (ref 6–20)
CO2: 24 mmol/L (ref 22–32)
Calcium: 8.8 mg/dL — ABNORMAL LOW (ref 8.9–10.3)
Chloride: 107 mmol/L (ref 98–111)
Creatinine, Ser: 0.95 mg/dL (ref 0.61–1.24)
GFR, Estimated: >60 mL/min (ref >60)
Glucose, Bld: 91 mg/dL (ref 70–99)
Potassium: 3.6 mmol/L (ref 3.5–5.1)
Sodium: 140 mmol/L (ref 135–145)
Total Bilirubin: 0.7 mg/dL (ref 0.0–1.2)
Total Protein: 7.1 g/dL (ref 6.5–8.1)

## 2023-12-30 LAB — URINALYSIS, ROUTINE W REFLEX MICROSCOPIC
Bilirubin Urine: NEGATIVE
Glucose, UA: NEGATIVE mg/dL
Hgb urine dipstick: NEGATIVE
Ketones, ur: 5 mg/dL — AB
Leukocytes,Ua: NEGATIVE
Nitrite: NEGATIVE
Protein, ur: NEGATIVE mg/dL
Specific Gravity, Urine: 1.027 (ref 1.005–1.030)
pH: 5 (ref 5.0–8.0)

## 2023-12-30 LAB — CBC
HCT: 46.8 % (ref 39.0–52.0)
Hemoglobin: 14.9 g/dL (ref 13.0–17.0)
MCH: 28.9 pg (ref 26.0–34.0)
MCHC: 31.8 g/dL (ref 30.0–36.0)
MCV: 90.9 fL (ref 80.0–100.0)
Platelets: 236 K/uL (ref 150–400)
RBC: 5.15 MIL/uL (ref 4.22–5.81)
RDW: 12.2 % (ref 11.5–15.5)
WBC: 9 K/uL (ref 4.0–10.5)
nRBC: 0 % (ref 0.0–0.2)

## 2023-12-30 LAB — LIPASE, BLOOD: Lipase: 30 U/L (ref 11–51)

## 2023-12-30 NOTE — ED Triage Notes (Signed)
 Pt complaining of lower abdominal pain that started 3 days ago. Having diarrhea about 6 times a day. Had his wisdom teeth extracted about a week ago. Has not taken any over the counter medication.

## 2023-12-31 DIAGNOSIS — R197 Diarrhea, unspecified: Secondary | ICD-10-CM | POA: Diagnosis not present

## 2023-12-31 MED ORDER — LOPERAMIDE HCL 2 MG PO CAPS
4.0000 mg | ORAL_CAPSULE | Freq: Once | ORAL | Status: AC
  Administered 2023-12-31: 4 mg via ORAL
  Filled 2023-12-31: qty 2

## 2023-12-31 NOTE — ED Provider Notes (Signed)
 [place] EMERGENCY DEPARTMENT AT [facility] Provider Note   CSN: [id number] Arrival date & time: 12/30/23  2009     Patient presents with: Abdominal Pain   Dominic Bryant is a 28 y.o. male.  Patient with past medical history significant for major depressive disorder, obesity presents to the emergency room complaining of 3 days of diarrhea.  He also endorses some mild epigastric abdominal pain.  Patient states this feels like previous pancreatitis.  He denies drinking alcohol.  He is resting comfortably at the time of my exam.  Patient denies chest pain, shortness of breath, fever.    Abdominal Pain      Prior to Admission medications   Medication Sig Start Date End Date Taking? Authorizing Provider  celecoxib  (CELEBREX ) 200 MG capsule Take 1 capsule (200 mg total) by mouth daily. 05/03/23   McElwee, [name] A, NP  levocetirizine (XYZAL ) 5 MG tablet Take 1 tablet (5 mg total) by mouth every evening. Patient not taking: Reported on 10/11/2023 06/14/23   Nedra Tinnie LABOR, NP  QUEtiapine  (SEROQUEL  XR) 300 MG 24 hr tablet Take 1 tablet (300 mg total) by mouth at bedtime. 08/30/23   McElwee, [name] A, NP  sertraline  (ZOLOFT ) 100 MG tablet Take 2 tablets (200 mg total) by mouth daily. 07/09/23 10/11/23  McElwee, [name] A, NP  ARIPiprazole  (ABILIFY ) 5 MG tablet Take 5 mg by mouth daily.  06/07/20  [provider]  FLUoxetine  (PROZAC ) 10 MG tablet Take 1 tablet (10 mg total) by mouth daily. 09/10/19 06/07/20  Rolinda Rogue, MD    Allergies: Patient has no known allergies.    Review of Systems  Gastrointestinal:  Positive for abdominal pain.    Updated Vital Signs BP 118/72 (BP Location: Right Arm)   Pulse 63   Temp 98.4 F (36.9 C) (Oral)   Resp 20   Ht 5' 7 (1.702 m)   Wt 104.3 kg   SpO2 100%   BMI 36.02 kg/m   Physical Exam Vitals and nursing note reviewed.  Constitutional:      General: He is not in acute distress.    Appearance: He is well-developed.   HENT:     Head: Normocephalic and atraumatic.  Eyes:     Conjunctiva/sclera: Conjunctivae normal.  Cardiovascular:     Rate and Rhythm: Normal rate and regular rhythm.     Heart sounds: No murmur heard. Pulmonary:     Effort: Pulmonary effort is normal. No respiratory distress.     Breath sounds: Normal breath sounds.  Abdominal:     Palpations: Abdomen is soft.     Tenderness: There is no abdominal tenderness.  Musculoskeletal:        General: No swelling.     Cervical back: Neck supple.  Skin:    General: Skin is warm and dry.     Capillary Refill: Capillary refill takes less than 2 seconds.  Neurological:     Mental Status: He is alert.  Psychiatric:        Mood and Affect: Mood normal.     (all labs ordered are listed, but only abnormal results are displayed) Labs Reviewed  COMPREHENSIVE METABOLIC PANEL WITH GFR - Abnormal; Notable for the following components:      Result Value   Calcium 8.8 (*)    All other components within normal limits  URINALYSIS, ROUTINE W REFLEX MICROSCOPIC - Abnormal; Notable for the following components:   Ketones, ur 5 (*)    All other components within  normal limits  LIPASE, BLOOD  CBC    EKG: None  Radiology: No results found.   Procedures   Medications Ordered in the ED  loperamide  (IMODIUM ) capsule 4 mg (4 mg Oral Given 12/31/23 0047)                                    Medical Decision Making Amount and/or Complexity of Data Reviewed Labs: ordered.  Risk Prescription drug management.   This patient presents to the ED for concern of diarrhea with abdominal pain, this involves an extensive number of treatment options, and is a complaint that carries with it a high risk of complications and morbidity.  The differential diagnosis includes infectious diarrhea, gastritis, colitis, diverticulitis, ulcerative colitis, others   Co morbidities / Chronic conditions that complicate the patient evaluation  Major depressive  disorder   Additional history obtained:  Additional history obtained from EMR External records from outside source obtained and reviewed including family's notes   Lab Tests:  I Ordered, and personally interpreted labs.  The pertinent results include: Unremarkable CMP, CBC, lipase   Imaging Studies ordered:  Patient has no abdominal tenderness on exam.  No indication for emergent abdominal imaging at this time.  Nonsurgical/nonacute abdomen     Problem List / ED Course / Critical interventions / Medication management   I ordered medication including Imodium  Reevaluation of the patient after these medicines showed that the patient improved I have reviewed the patients home medicines and have made adjustments as needed  Social Determinants of Health:  Patient with history of housing insecurity, daily smoker   Test / Admission - Considered:  Patient with grossly benign exam and no significant abnormality on lab work.  No signs of this time of emergent condition requiring admission or further emergent workup.  Symptoms consistent with diarrhea, likely infectious.  Lipase normal showing no signs of pancreatitis.  No abdominal tenderness on exam to suggest an acute surgical abdomen.  Patient stable for discharge home with return precautions.      Final diagnoses:  Diarrhea of presumed infectious origin    ED Discharge Orders     None          Logan Ubaldo KATHEE DEVONNA 12/31/23 0117    Carita Senior, MD 12/31/23 MANUS

## 2023-12-31 NOTE — ED Notes (Signed)
 Discharge instructions reviewed with patient. Patient questions answered and opportunity for education reviewed. Patient voices understanding of discharge instructions with no further questions. Patient ambulatory with steady gait to lobby.

## 2023-12-31 NOTE — Discharge Instructions (Signed)
 You were evaluated this morning for diarrhea.  Your workup was reassuring.  You may take Imodium  A-D over-the-counter at home for diarrhea control.  Follow-up as new with your primary care provider.

## 2024-01-02 ENCOUNTER — Ambulatory Visit (INDEPENDENT_AMBULATORY_CARE_PROVIDER_SITE_OTHER): Admitting: Nurse Practitioner

## 2024-01-02 ENCOUNTER — Encounter: Payer: Self-pay | Admitting: Nurse Practitioner

## 2024-01-02 VITALS — BP 100/68 | HR 58 | Temp 97.5°F | Ht 67.0 in | Wt 216.4 lb

## 2024-01-02 DIAGNOSIS — K529 Noninfective gastroenteritis and colitis, unspecified: Secondary | ICD-10-CM | POA: Diagnosis not present

## 2024-01-02 MED ORDER — LOPERAMIDE HCL 2 MG PO CAPS: 4.0000 mg | ORAL_CAPSULE | ORAL | 0 refills | Status: DC | PRN

## 2024-01-02 NOTE — Progress Notes (Signed)
 Established Patient Office Visit  Subjective   Patient ID: Dominic Bryant, male    DOB: 08/13/1995  Age: 28 y.o. MRN: [id number]  Chief Complaint  Patient presents with   Abdominal Pain    Since Friday with Nausea and vomiting, dizziness, no appetite    HPI Discussed the use of AI scribe software for clinical note transcription with the patient, who gave verbal consent to proceed.  History of Present Illness   Dominic Bryant is a 28 year old male who presents with stomach pain and diarrhea.  He has experienced stomach pain since Friday, located across the lower abdomen, with nausea and diarrhea. There is no vomiting, but he has a lack of appetite and feels disoriented and tired. During a hospital visit on Sunday, blood was noted in his stool, and Imodium  was administered, providing temporary relief. The pain returned the following day, and no additional Imodium  has been taken. He denies recent illness in contacts and has not eaten out recently. Attempts to eat noodles and hotdogs resulted in discomfort, with hotdogs causing immediate diarrhea. His stool is watery with mucus and a 'vomit' smell, occurring approximately four to six times daily, with increased frequency the previous day. He is drinking water but not sufficiently and has not tried other medications. There is no fever, and no new foods except for tacos eaten on Thursday.       ROS See pertinent positives and negatives per HPI.    Objective:     BP 100/68 (BP Location: Left Arm, Patient Position: Sitting, Cuff Size: Normal)   Pulse (!) 58   Temp (!) 97.5 F (36.4 C)   Ht 5' 7 (1.702 m)   Wt 216 lb 6.4 oz (98.2 kg)   SpO2 96%   BMI 33.89 kg/m    Physical Exam Vitals and nursing note reviewed.  Constitutional:      Appearance: Normal appearance.  HENT:     Head: Normocephalic.  Eyes:     Conjunctiva/sclera: Conjunctivae normal.  Cardiovascular:     Rate and Rhythm: Normal rate and regular rhythm.      Pulses: Normal pulses.     Heart sounds: Normal heart sounds.  Pulmonary:     Effort: Pulmonary effort is normal.     Breath sounds: Normal breath sounds.  Abdominal:     General: Abdomen is flat. Bowel sounds are normal.     Palpations: Abdomen is soft.     Tenderness: There is abdominal tenderness in the right lower quadrant and left lower quadrant. There is no guarding or rebound. Negative signs include Murphy's sign and McBurney's sign.  Musculoskeletal:     Cervical back: Normal range of motion.  Skin:    General: Skin is warm.  Neurological:     General: No focal deficit present.     Mental Status: He is alert and oriented to person, place, and time.  Psychiatric:        Mood and Affect: Mood normal.        Behavior: Behavior normal.        Thought Content: Thought content normal.        Judgment: Judgment normal.      Assessment & Plan:   Gastroenteritis   Acute lower abdominal pain, diarrhea, and nausea indicate viral or bacterial gastroenteritis. Labs for liver and pancreas are normal. No recent exposure to sick contacts or new foods except tacos. Advise a bland diet including toast, applesauce, bananas, rice, and plain chicken.  Encourage fluid intake by alternating Gatorade or Pedialyte with water. Prescribe Imodium , two capsules after each diarrhea episode, with a maximum of eight per day (16mg ). Instruct to report any worsening symptoms or fever.   Return if symptoms worsen or fail to improve.    Tinnie DELENA Harada, NP

## 2024-01-02 NOTE — Patient Instructions (Addendum)
 It was great to see you!  Eat a bland diet:  Bananas, rice, applesauce, toast, plain chicken  Sip on fluids through out the day. Alternate gatorade or pedialyte and water   Start imodium  after each episode of diarrhea make sure this is at least 1 hour apart. You will take 2 pills each time. Maximum or 8 pills in a day   Call [facility] at [phone] to schedule MRI  Call Orthopedics for a follow-up appointment [phone]  Let's follow-up if your symptoms worsen or don't improve.   Take care,  Tinnie Harada, NP

## 2024-01-11 ENCOUNTER — Encounter: Payer: Self-pay | Admitting: Nurse Practitioner

## 2024-01-11 NOTE — Telephone Encounter (Signed)
 I tried to call patient and no answer and no voicemail. I did send message back via mychart to seek care at ED or urgent care.

## 2024-01-14 NOTE — Telephone Encounter (Signed)
 I called patient back to follow up on message sent on Friday. Patient said that he feels a little bit better and I asked if he would like for me to make him and appointment and patient refused and will call back if he needs an appointment.

## 2024-01-15 NOTE — Telephone Encounter (Signed)
 Noted

## 2024-01-27 ENCOUNTER — Other Ambulatory Visit: Payer: Self-pay

## 2024-01-27 ENCOUNTER — Emergency Department (HOSPITAL_COMMUNITY)
Admission: EM | Admit: 2024-01-27 | Discharge: 2024-01-28 | Disposition: A | Discharge status: Home / Self Care | Attending: Emergency Medicine | Admitting: Emergency Medicine

## 2024-01-27 DIAGNOSIS — R55 Syncope and collapse: Secondary | ICD-10-CM | POA: Diagnosis present

## 2024-01-27 NOTE — ED Triage Notes (Signed)
 Pt reports LOC and not knowing if he hit his head. Pt not on thinners

## 2024-01-27 NOTE — ED Provider Notes (Signed)
  EMERGENCY DEPARTMENT AT [facility] Provider Note   CSN: [id number] Arrival date & time: 01/27/24  2311     Patient presents with: Loss of Consciousness   Dominic Bryant is a 28 y.o. male.  {Add pertinent medical, surgical, social history, OB history to YEP:67052} The history is provided by the patient.  Loss of Consciousness .ear Fell/passed out at home. Kneeling on floor going to stand up and lost balance, got dizzy, woke up on the floor.  Had been kneeling for 5-6 minutes.  No injuires No ha, cp, sob, abdominal pain No medical problems. No hx/o dvt/pe Uses tobacco, occ alcohol, uses marijuana.  No recent loc.      Prior to Admission medications   Medication Sig Start Date End Date Taking? Authorizing Provider  celecoxib  (CELEBREX ) 200 MG capsule Take 1 capsule (200 mg total) by mouth daily. 05/03/23   McElwee, [name] A, NP  dexamethasone (DECADRON) 2 MG tablet Take 2 mg by mouth 3 (three) times daily. 12/07/23   [provider]  ibuprofen  (ADVIL ) 400 MG tablet Take 400 mg by mouth 4 (four) times daily. 12/07/23   [provider]  levocetirizine (XYZAL ) 5 MG tablet Take 1 tablet (5 mg total) by mouth every evening. Patient not taking: Reported on 10/11/2023 06/14/23   Nedra Tinnie LABOR, NP  loperamide  (IMODIUM ) 2 MG capsule Take 2 capsules (4 mg total) by mouth as needed for diarrhea or loose stools. 01/02/24   McElwee, [name] A, NP  QUEtiapine  (SEROQUEL  XR) 300 MG 24 hr tablet Take 1 tablet (300 mg total) by mouth at bedtime. 08/30/23   McElwee, [name] A, NP  sertraline  (ZOLOFT ) 100 MG tablet Take 2 tablets (200 mg total) by mouth daily. 07/09/23 01/02/24  McElwee, [name] A, NP  ARIPiprazole  (ABILIFY ) 5 MG tablet Take 5 mg by mouth daily.  06/07/20  [provider]  FLUoxetine  (PROZAC ) 10 MG tablet Take 1 tablet (10 mg total) by mouth daily. 09/10/19 06/07/20  Rolinda Rogue, MD    Allergies: Patient has no known allergies.    Review  of Systems  Cardiovascular:  Positive for syncope.  All other systems reviewed and are negative.   Updated Vital Signs BP 120/68 (BP Location: Right Arm)   Pulse 64   Temp 98 F (36.7 C) (Oral)   Resp 15   SpO2 98%   Physical Exam Vitals and nursing note reviewed.  Constitutional:      Appearance: He is well-developed.  HENT:     Head: Normocephalic and atraumatic.  Cardiovascular:     Rate and Rhythm: Normal rate and regular rhythm.     Heart sounds: No murmur heard. Pulmonary:     Effort: Pulmonary effort is normal. No respiratory distress.     Breath sounds: Normal breath sounds.  Abdominal:     Palpations: Abdomen is soft.     Tenderness: There is no abdominal tenderness. There is no guarding or rebound.  Musculoskeletal:        General: No tenderness.  Skin:    General: Skin is warm and dry.  Neurological:     Mental Status: He is alert and oriented to person, place, and time.     Comments: 5/5 strength in all four extremities with sensation to light touch intact in all four extremities.   Psychiatric:        Behavior: Behavior normal.     (all labs ordered are listed, but only abnormal results are displayed) Labs Reviewed -  No data to display  EKG: None  Radiology: No results found.  {Document cardiac monitor, telemetry assessment procedure when appropriate:32947} Procedures   Medications Ordered in the ED - No data to display    {Click here for ABCD2, HEART and other calculators REFRESH Note before signing:1}                              Medical Decision Making  ***  {Document critical care time when appropriate  Document review of labs and clinical decision tools ie CHADS2VASC2, etc  Document your independent review of radiology images and any outside records  Document your discussion with family members, caretakers and with consultants  Document social determinants of health affecting pt's care  Document your decision making why or why not  admission, treatments were needed:32947:::1}   Final diagnoses:  None    ED Discharge Orders     None

## 2024-01-27 NOTE — ED Triage Notes (Signed)
 Pt BIB [facility] from home. Pt reports having unwitnessed syncopal episode in his living room. Pt positive orthostatics. Pt c/o dizziness upon standing 126/80 after bolus.  70HR 100% RA 92CBG

## 2024-01-28 DIAGNOSIS — R55 Syncope and collapse: Secondary | ICD-10-CM | POA: Diagnosis not present

## 2024-01-28 LAB — MAGNESIUM: Magnesium: 2 mg/dL (ref 1.7–2.4)

## 2024-01-28 LAB — CBC WITH DIFFERENTIAL/PLATELET
Abs Immature Granulocytes: 0.03 K/uL (ref 0.00–0.07)
Basophils Absolute: 0.1 K/uL (ref 0.0–0.1)
Basophils Relative: 1 %
Eosinophils Absolute: 1 K/uL — ABNORMAL HIGH (ref 0.0–0.5)
Eosinophils Relative: 12 %
HCT: 46.8 % (ref 39.0–52.0)
Hemoglobin: 14.2 g/dL (ref 13.0–17.0)
Immature Granulocytes: 0 %
Lymphocytes Relative: 30 %
Lymphs Abs: 2.3 K/uL (ref 0.7–4.0)
MCH: 27.9 pg (ref 26.0–34.0)
MCHC: 30.3 g/dL (ref 30.0–36.0)
MCV: 91.9 fL (ref 80.0–100.0)
Monocytes Absolute: 0.6 K/uL (ref 0.1–1.0)
Monocytes Relative: 7 %
Neutro Abs: 3.9 K/uL (ref 1.7–7.7)
Neutrophils Relative %: 50 %
Platelets: 226 K/uL (ref 150–400)
RBC: 5.09 MIL/uL (ref 4.22–5.81)
RDW: 13 % (ref 11.5–15.5)
WBC: 7.9 K/uL (ref 4.0–10.5)
nRBC: 0 % (ref 0.0–0.2)

## 2024-01-28 LAB — COMPREHENSIVE METABOLIC PANEL WITH GFR
ALT: 16 U/L (ref 0–44)
AST: 20 U/L (ref 15–41)
Albumin: 3.3 g/dL — ABNORMAL LOW (ref 3.5–5.0)
Alkaline Phosphatase: 39 U/L (ref 38–126)
Anion gap: 9 (ref 5–15)
BUN: 6 mg/dL (ref 6–20)
CO2: 22 mmol/L (ref 22–32)
Calcium: 8.5 mg/dL — ABNORMAL LOW (ref 8.9–10.3)
Chloride: 107 mmol/L (ref 98–111)
Creatinine, Ser: 0.79 mg/dL (ref 0.61–1.24)
GFR, Estimated: >60 mL/min (ref >60)
Glucose, Bld: 83 mg/dL (ref 70–99)
Potassium: 3.8 mmol/L (ref 3.5–5.1)
Sodium: 138 mmol/L (ref 135–145)
Total Bilirubin: 0.5 mg/dL (ref 0.0–1.2)
Total Protein: 6 g/dL — ABNORMAL LOW (ref 6.5–8.1)

## 2024-02-14 ENCOUNTER — Encounter: Payer: Self-pay | Admitting: Nurse Practitioner

## 2024-04-04 ENCOUNTER — Ambulatory Visit: Payer: Self-pay | Admitting: *Deleted
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
## 2024-04-10 ENCOUNTER — Other Ambulatory Visit: Payer: Self-pay

## 2024-04-10 ENCOUNTER — Inpatient Hospital Stay (HOSPITAL_COMMUNITY)
Admission: AD | Admit: 2024-04-10 | Discharge: 2024-04-17 | DRG: 885 | Disposition: A | Discharge status: Home / Self Care | Payer: MEDICAID | Source: Intra-hospital | Attending: Student in an Organized Health Care Education/Training Program | Admitting: Student in an Organized Health Care Education/Training Program

## 2024-04-10 ENCOUNTER — Ambulatory Visit (INDEPENDENT_AMBULATORY_CARE_PROVIDER_SITE_OTHER): Payer: MEDICAID | Admitting: Nurse Practitioner

## 2024-04-10 ENCOUNTER — Encounter: Payer: Self-pay | Admitting: Nurse Practitioner

## 2024-04-10 ENCOUNTER — Ambulatory Visit (HOSPITAL_COMMUNITY)
Admission: EM | Admit: 2024-04-10 | Discharge: 2024-04-10 | Disposition: A | Discharge status: Other Acute Inpatient Hospital | Payer: MEDICAID | Attending: Psychiatry | Admitting: Psychiatry

## 2024-04-10 ENCOUNTER — Telehealth: Payer: Self-pay

## 2024-04-10 ENCOUNTER — Encounter (HOSPITAL_COMMUNITY): Payer: Self-pay | Admitting: Psychiatry

## 2024-04-10 DIAGNOSIS — Z5941 Food insecurity: Secondary | ICD-10-CM | POA: Diagnosis not present

## 2024-04-10 DIAGNOSIS — R441 Visual hallucinations: Secondary | ICD-10-CM

## 2024-04-10 DIAGNOSIS — Z79899 Other long term (current) drug therapy: Secondary | ICD-10-CM

## 2024-04-10 DIAGNOSIS — G8929 Other chronic pain: Secondary | ICD-10-CM | POA: Insufficient documentation

## 2024-04-10 DIAGNOSIS — Z560 Unemployment, unspecified: Secondary | ICD-10-CM

## 2024-04-10 DIAGNOSIS — Z9151 Personal history of suicidal behavior: Secondary | ICD-10-CM | POA: Diagnosis not present

## 2024-04-10 DIAGNOSIS — F333 Major depressive disorder, recurrent, severe with psychotic symptoms: Secondary | ICD-10-CM | POA: Insufficient documentation

## 2024-04-10 DIAGNOSIS — Z5948 Other specified lack of adequate food: Secondary | ICD-10-CM | POA: Diagnosis not present

## 2024-04-10 DIAGNOSIS — F322 Major depressive disorder, single episode, severe without psychotic features: Secondary | ICD-10-CM

## 2024-04-10 DIAGNOSIS — M549 Dorsalgia, unspecified: Secondary | ICD-10-CM | POA: Insufficient documentation

## 2024-04-10 DIAGNOSIS — R45851 Suicidal ideations: Secondary | ICD-10-CM | POA: Diagnosis present

## 2024-04-10 DIAGNOSIS — Z825 Family history of asthma and other chronic lower respiratory diseases: Secondary | ICD-10-CM | POA: Diagnosis not present

## 2024-04-10 DIAGNOSIS — Z91410 Personal history of adult physical and sexual abuse: Secondary | ICD-10-CM

## 2024-04-10 DIAGNOSIS — E039 Hypothyroidism, unspecified: Secondary | ICD-10-CM | POA: Diagnosis present

## 2024-04-10 DIAGNOSIS — F332 Major depressive disorder, recurrent severe without psychotic features: Secondary | ICD-10-CM | POA: Diagnosis not present

## 2024-04-10 DIAGNOSIS — G4700 Insomnia, unspecified: Secondary | ICD-10-CM | POA: Diagnosis present

## 2024-04-10 DIAGNOSIS — F17210 Nicotine dependence, cigarettes, uncomplicated: Secondary | ICD-10-CM | POA: Diagnosis present

## 2024-04-10 DIAGNOSIS — Z811 Family history of alcohol abuse and dependence: Secondary | ICD-10-CM

## 2024-04-10 DIAGNOSIS — R4587 Impulsiveness: Secondary | ICD-10-CM | POA: Diagnosis present

## 2024-04-10 DIAGNOSIS — Z59868 Other specified financial insecurity: Secondary | ICD-10-CM | POA: Diagnosis not present

## 2024-04-10 DIAGNOSIS — F323 Major depressive disorder, single episode, severe with psychotic features: Secondary | ICD-10-CM | POA: Diagnosis present

## 2024-04-10 DIAGNOSIS — Z91128 Patient's intentional underdosing of medication regimen for other reason: Secondary | ICD-10-CM | POA: Diagnosis not present

## 2024-04-10 LAB — TSH: TSH: 3.345 u[IU]/mL (ref 0.350–4.500)

## 2024-04-10 LAB — COMPREHENSIVE METABOLIC PANEL WITH GFR
ALT: 18 U/L (ref 0–44)
AST: 22 U/L (ref 15–41)
Albumin: 3.2 g/dL — ABNORMAL LOW (ref 3.5–5.0)
Alkaline Phosphatase: 39 U/L (ref 38–126)
Anion gap: 9 (ref 5–15)
BUN: 5 mg/dL — ABNORMAL LOW (ref 6–20)
CO2: 24 mmol/L (ref 22–32)
Calcium: 8.4 mg/dL — ABNORMAL LOW (ref 8.9–10.3)
Chloride: 106 mmol/L (ref 98–111)
Creatinine, Ser: 1.01 mg/dL (ref 0.61–1.24)
GFR, Estimated: >60 mL/min (ref >60)
Glucose, Bld: 76 mg/dL (ref 70–99)
Potassium: 3.8 mmol/L (ref 3.5–5.1)
Sodium: 139 mmol/L (ref 135–145)
Total Bilirubin: 0.5 mg/dL (ref 0.0–1.2)
Total Protein: 5.3 g/dL — ABNORMAL LOW (ref 6.5–8.1)

## 2024-04-10 LAB — HEMOGLOBIN A1C
Hgb A1c MFr Bld: 4 % — ABNORMAL LOW (ref 4.8–5.6)
Mean Plasma Glucose: 68.1 mg/dL

## 2024-04-10 LAB — POCT URINE DRUG SCREEN - MANUAL ENTRY (I-SCREEN)
POC Amphetamine UR: NOT DETECTED
POC Buprenorphine (BUP): NOT DETECTED
POC Cocaine UR: NOT DETECTED
POC Marijuana UR: POSITIVE — AB
POC Methadone UR: NOT DETECTED
POC Methamphetamine UR: NOT DETECTED
POC Morphine: NOT DETECTED
POC Oxazepam (BZO): NOT DETECTED
POC Oxycodone UR: NOT DETECTED
POC Secobarbital (BAR): NOT DETECTED

## 2024-04-10 LAB — CBC WITH DIFFERENTIAL/PLATELET
Abs Immature Granulocytes: 0.02 K/uL (ref 0.00–0.07)
Basophils Absolute: 0 K/uL (ref 0.0–0.1)
Basophils Relative: 1 %
Eosinophils Absolute: 0.4 K/uL (ref 0.0–0.5)
Eosinophils Relative: 10 %
HCT: 48 % (ref 39.0–52.0)
Hemoglobin: 15.3 g/dL (ref 13.0–17.0)
Immature Granulocytes: 1 %
Lymphocytes Relative: 41 %
Lymphs Abs: 1.8 K/uL (ref 0.7–4.0)
MCH: 28.4 pg (ref 26.0–34.0)
MCHC: 31.9 g/dL (ref 30.0–36.0)
MCV: 89.1 fL (ref 80.0–100.0)
Monocytes Absolute: 0.3 K/uL (ref 0.1–1.0)
Monocytes Relative: 7 %
Neutro Abs: 1.8 K/uL (ref 1.7–7.7)
Neutrophils Relative %: 40 %
Platelets: 191 K/uL (ref 150–400)
RBC: 5.39 MIL/uL (ref 4.22–5.81)
RDW: 13 % (ref 11.5–15.5)
WBC: 4.4 K/uL (ref 4.0–10.5)
nRBC: 0 % (ref 0.0–0.2)

## 2024-04-10 LAB — LIPID PANEL
Cholesterol: 153 mg/dL (ref 0–200)
HDL: 29 mg/dL — ABNORMAL LOW (ref >40)
LDL Cholesterol: 109 mg/dL — ABNORMAL HIGH (ref 0–99)
Total CHOL/HDL Ratio: 5.3 ratio
Triglycerides: 75 mg/dL (ref <150)
VLDL: 15 mg/dL (ref 0–40)

## 2024-04-10 LAB — ETHANOL: Alcohol, Ethyl (B): <15 mg/dL (ref <15)

## 2024-04-10 MED ORDER — HALOPERIDOL 5 MG PO TABS: 5.0000 mg | ORAL_TABLET | Freq: Three times a day (TID) | ORAL | Status: DC | PRN

## 2024-04-10 MED ORDER — TRAZODONE HCL 50 MG PO TABS
50.0000 mg | ORAL_TABLET | Freq: Every evening | ORAL | Status: DC | PRN
  Administered 2024-04-10 – 2024-04-16 (×2): 50 mg via ORAL
  Filled 2024-04-10 (×4): qty 1

## 2024-04-10 MED ORDER — DIPHENHYDRAMINE HCL 25 MG PO CAPS: 50.0000 mg | ORAL_CAPSULE | Freq: Three times a day (TID) | ORAL | Status: DC | PRN

## 2024-04-10 MED ORDER — ACETAMINOPHEN 325 MG PO TABS: 650.0000 mg | ORAL_TABLET | Freq: Four times a day (QID) | ORAL | Status: DC | PRN

## 2024-04-10 MED ORDER — SERTRALINE HCL 50 MG PO TABS
50.0000 mg | ORAL_TABLET | Freq: Every day | ORAL | Status: DC
  Administered 2024-04-11 – 2024-04-12 (×2): 50 mg via ORAL
  Filled 2024-04-10 (×3): qty 1

## 2024-04-10 MED ORDER — DIPHENHYDRAMINE HCL 50 MG/ML IJ SOLN: 50.0000 mg | Freq: Three times a day (TID) | INTRAMUSCULAR | Status: DC | PRN

## 2024-04-10 MED ORDER — NICOTINE 14 MG/24HR TD PT24: 14.0000 mg | MEDICATED_PATCH | Freq: Every day | TRANSDERMAL | Status: DC

## 2024-04-10 MED ORDER — LORAZEPAM 2 MG/ML IJ SOLN: 2.0000 mg | Freq: Three times a day (TID) | INTRAMUSCULAR | Status: DC | PRN

## 2024-04-10 MED ORDER — HALOPERIDOL LACTATE 5 MG/ML IJ SOLN: 5.0000 mg | Freq: Three times a day (TID) | INTRAMUSCULAR | Status: DC | PRN

## 2024-04-10 MED ORDER — HYDROXYZINE HCL 25 MG PO TABS: 25.0000 mg | ORAL_TABLET | Freq: Three times a day (TID) | ORAL | Status: DC | PRN

## 2024-04-10 MED ORDER — MAGNESIUM HYDROXIDE 400 MG/5ML PO SUSP: 30.0000 mL | Freq: Every day | ORAL | Status: DC | PRN

## 2024-04-10 MED ORDER — IBUPROFEN 400 MG PO TABS
400.0000 mg | ORAL_TABLET | Freq: Four times a day (QID) | ORAL | Status: DC | PRN
  Administered 2024-04-13 – 2024-04-16 (×4): 400 mg via ORAL
  Filled 2024-04-10 (×4): qty 1

## 2024-04-10 MED ORDER — ALUM & MAG HYDROXIDE-SIMETH 200-200-20 MG/5ML PO SUSP: 30.0000 mL | ORAL | Status: DC | PRN

## 2024-04-10 MED ORDER — SERTRALINE HCL 25 MG PO TABS: 25.0000 mg | ORAL_TABLET | Freq: Every day | ORAL | Status: DC

## 2024-04-10 MED ORDER — NICOTINE 14 MG/24HR TD PT24
14.0000 mg | MEDICATED_PATCH | Freq: Every day | TRANSDERMAL | Status: DC
  Filled 2024-04-10: qty 1

## 2024-04-10 MED ORDER — IBUPROFEN 400 MG PO TABS: 400.0000 mg | ORAL_TABLET | Freq: Four times a day (QID) | ORAL | Status: DC | PRN

## 2024-04-10 MED ORDER — LORATADINE 10 MG PO TABS: 10.0000 mg | ORAL_TABLET | Freq: Every evening | ORAL | Status: DC | PRN

## 2024-04-10 MED ORDER — LEVOCETIRIZINE DIHYDROCHLORIDE 5 MG PO TABS: 5.0000 mg | ORAL_TABLET | Freq: Every evening | ORAL | Status: DC | PRN

## 2024-04-10 MED ORDER — HALOPERIDOL LACTATE 5 MG/ML IJ SOLN: 10.0000 mg | Freq: Three times a day (TID) | INTRAMUSCULAR | Status: DC | PRN

## 2024-04-10 MED ORDER — QUETIAPINE FUMARATE ER 50 MG PO TB24
100.0000 mg | ORAL_TABLET | Freq: Every day | ORAL | Status: DC
  Administered 2024-04-10 – 2024-04-16 (×7): 100 mg via ORAL
  Filled 2024-04-10 (×7): qty 2

## 2024-04-10 MED ORDER — HYDROXYZINE HCL 25 MG PO TABS
25.0000 mg | ORAL_TABLET | Freq: Three times a day (TID) | ORAL | Status: DC | PRN
  Administered 2024-04-10: 25 mg via ORAL
  Filled 2024-04-10 (×4): qty 1

## 2024-04-10 MED ORDER — DIPHENHYDRAMINE HCL 50 MG PO CAPS: 50.0000 mg | ORAL_CAPSULE | Freq: Three times a day (TID) | ORAL | Status: DC | PRN

## 2024-04-10 MED ORDER — QUETIAPINE FUMARATE ER 50 MG PO TB24
100.0000 mg | ORAL_TABLET | Freq: Every day | ORAL | Status: DC
Start: 2024-04-10 — End: 2024-04-10

## 2024-04-10 MED ORDER — SERTRALINE HCL 50 MG PO TABS
50.0000 mg | ORAL_TABLET | Freq: Every day | ORAL | Status: DC
  Administered 2024-04-10: 50 mg via ORAL
  Filled 2024-04-10: qty 1

## 2024-04-10 MED ORDER — NICOTINE 21 MG/24HR TD PT24
21.0000 mg | MEDICATED_PATCH | Freq: Every day | TRANSDERMAL | Status: DC
  Filled 2024-04-10 (×3): qty 1

## 2024-04-10 MED ORDER — TRAZODONE HCL 50 MG PO TABS: 50.0000 mg | ORAL_TABLET | Freq: Every evening | ORAL | Status: DC | PRN

## 2024-04-10 NOTE — Group Note (Signed)
 Date:  04/10/2024 Time:  11:39 PM  Group Topic/Focus:  Wrap-Up Group:   The focus of this group is to help patients review their daily goal of treatment and discuss progress on daily workbooks.    Participation Level:  Did Not Attend  Participation Quality:  Resistant  Affect:  Resistant  Cognitive:  Lacking  Insight: None  Engagement in Group:  None  Modes of Intervention:  Discussion  Additional Comments:  patient did not attend wrap up group  Bari Moats 04/10/2024, 11:39 PM

## 2024-04-10 NOTE — Tx Team (Signed)
 Initial Treatment Plan 04/10/2024 6:27 PM Dominic Bryant [id number]    PATIENT STRESSORS: Other: Had no answers     PATIENT STRENGTHS: Other: no answer   PATIENT IDENTIFIED PROBLEMS:                      DISCHARGE CRITERIA:  Improved stabilization in mood, thinking, and/or behavior  PRELIMINARY DISCHARGE PLAN: Attend aftercare/continuing care group Placement in alternative living arrangements  PATIENT/FAMILY INVOLVEMENT: This treatment plan has been presented to and reviewed with the patient, Dominic Bryant.  The patient and family have been given the opportunity to ask questions and make suggestions.  Elouise Wolm Morel, RN 04/10/2024, 6:27 PM
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
## 2024-04-10 NOTE — Progress Notes (Signed)
   Established Patient Office Visit  Subjective   Patient ID: Dominic Bryant, male    DOB: [birth year]  Age: 28 y.o. MRN: [id number]  Chief Complaint  Patient presents with   Depression   HPI:  [name] states that his depression has been getting worse over the past few months. He is feeling helpless and is having suicidal ideations. He does not want to live anymore and has been struggling with panic attacks. He has been self-harming by hitting his head against walls and the fridge. He states that he doesn't know what to do anymore and can't keep living like this. He stopped his medications since last visit in July and has not been following with psychiatry.     ROS See pertinent positives and negatives per HPI.    Objective:     There were no vitals taken for this visit.   Physical Exam Vitals and nursing note reviewed.  Constitutional:      Appearance: Normal appearance.  HENT:     Head: Normocephalic.  Eyes:     Conjunctiva/sclera: Conjunctivae normal.  Pulmonary:     Effort: Pulmonary effort is normal.  Musculoskeletal:     Cervical back: Normal range of motion.  Skin:    General: Skin is warm.  Neurological:     General: No focal deficit present.     Mental Status: He is alert and oriented to person, place, and time.  Psychiatric:        Mood and Affect: Mood is depressed. Affect is tearful.        Behavior: Behavior is agitated. Behavior is not aggressive.        Thought Content: Thought content includes suicidal ideation. Thought content does not include homicidal ideation. Thought content includes suicidal plan. Thought content does not include homicidal plan.        Judgment: Judgment is impulsive.      Assessment & Plan:   Problem List Items Addressed This Visit       Other   Major depressive disorder with single episode, in partial remission - Primary   Chronic, not controlled. He was late for his appointment and was told he would have to  reschedule. He went back to the car and slumped down and started crying. He states that he is having a mental breakdown. Upon getting back into exam room, he is very tearful, withdrawn. He states that he is having multiple suicidal thoughts - jumping off a bridge, crashing his car and doesn't want to live anymore. He stopped taking his medications in July and has not been seeing psychiatry. Called 911 for resource officer to evaluate and bring to behavioral health. While waiting with CMA in the room, he did get agitated and hit his head against the wall. The resource officers brought patient with their vehicle to behavioral health urgent care. His mother was called and updated on what happened and the plan.       I personally spent a total of 60 minutes in the care of the patient today including preparing to see the patient, getting/reviewing separately obtained history, counseling and educating, referring and communicating with other health care professionals, and documenting clinical information in the EHR.   Return for after discharge from behavioral health.    Dominic DELENA Harada, NP

## 2024-04-10 NOTE — Telephone Encounter (Addendum)
 Pt arrived to the office around 0818 for his 0800 OV with PCP. Pt was advised he would be seen as he arrived after the 10 minute grace period. Pt exited the office and was witnessed by the front office on the ground beside his car. FO came to get a provider to assess the incident. Pt told PCP he was having a mental breakdown. I called 911 to activate EMS for the pt to be transported to [facility]. PCP took pt into an exam room where he disclosed that he was suicidal. I then called 911 back to update them on this information. [place], CMA was in the exam room with the pt when I received a teams message from her asking me to come into the room. [place] then asked pt if he wanted to call his mom or anyone. Pt called his mom. She did not answer the phone and that triggered the pt. He tossed his phone onto the floor and stated, Every time, every f*ing time, every f*ing time, crying, proceeds to stand up, walk over to the wall and ramming his head into the wall, leaving a hole in the wall. I called his name with no response, then he began slamming both fists down onto the exam table. [place] and I exited the exam room. I called 911 again, as they instructed to do if anything changed, to update them with the change in behavior. Mom called pt's cell phone back and [place] was able to speak with her (on speaker phone). She stated pt has been having these episodes and he has been depressed but she did not know what else to do. Personnel from [facility] arrived, entered the exam room and spoke with the pt. Pt was transported to [facility].

## 2024-04-10 NOTE — ED Notes (Signed)
 Pt escorted to [facility].  All belongings from locker 5 given to safe transport.  Pt discharged without incident.

## 2024-04-10 NOTE — Telephone Encounter (Incomplete Revision)
 Pt arrived to the office around 0818 for his 0800 OV with PCP. Pt was advised he would be seen as he arrived after the 10 minute grace period. Pt exited the office and was witnessed by the front office on the ground beside his car. FO came to get a provider to assess the incident. Pt told PCP he was having a mental breakdown. I called 911 to activate EMS for the pt to be transported to [facility]. PCP took pt into an exam room where he disclosed that he was suicidal. I then called 911 back to update them on this information. [place], CMA was in the exam room with the pt when I received a teams message from her asking me to come into the room. [place] then asked pt if he wanted to call his mom or anyone. Pt called his mom. She did not answer the phone and that triggered the pt. He tossed his phone onto the floor and stated, Every time, every f*ing time, every f*ing time, crying, proceeds to stand up, walk over to the wall and ramming his head into the wall, leaving a hole in the wall. I called his name with no response, then he began slamming both fists down onto the exam table. [place] and I exited the exam room. I called 911 again, as they instructed to do if anything changed, to update them with the change in behavior. Mom called pt's cell phone back and [place] was able to speak with her (on speaker phone). She stated pt has been having these episodes and he has been depressed but she did not know what else to do. Personnel from [facility] arrived, entered the exam room and spoke with the pt. Pt was transported to [facility].
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
## 2024-04-10 NOTE — ED Provider Notes (Signed)
 Behavioral Health Urgent Care Medical Screening Exam  Patient Name: Dominic Bryant MRN: [id number] Date of Evaluation: 04/10/24 Chief Complaint:  Suicidal ideations Diagnosis:  Final diagnoses:  Severe recurrent major depression with psychotic features (HCC)  Suicidal ideation  Visual hallucinations    History of Present illness: Dominic Bryant 28 y.o., male patient presented to [facility] as a voluntary walk in accompanied by BHRT with complaints of suicidal ideations with thoughts to overdose, jump in front of traffic or buy a gun to shoot himself. Dominic Bryant Search, is seen face to face by this provider and chart reviewed on 04/10/24.  Per chart review, pt has a PPHx of MDD with psychotic features  and suicide attempt. Medical hx of acquired hypothyroidism and chronic back pain. Last inpatient hospitalization was in 2022 at [facility]. No current outpatient mental health treatment or medications.   On evaluation Dominic Bryant reports that he has been having suicidal ideations with thoughts to overdose, jump in front of traffic or buy a gun to shoot himself. He denies active intent but reports that in past suicide attempt, it was very last minute and impulsive. Pt reports suicide attempt last year 02/2023 by jumping off of a 15 foot bridge causing multiple compression fractures to his back. He reports that he now deals with chronic back pain which adds to his depressive mood. Pt reports having a supportive family however deals with multiple stressors including chronic pain, finance issues, being unemployed, upcoming court date for theft and assault in November, and not knowing if his daughter is his or another man's. He makes multiple negative self statements such as Im not shit, Im not worth shit, I got nothing going for me and it's just one thing after another, I don't know how to get my life back right. Pt reports symptoms of depression including sadness, hopelessness, helplessness, irritability, decreased  sleep and appetite, suicidal ideations, fatigue and decreased motivation. Pt states that he has not taken his medications for months and was last being seen by Dr. Akintayo 4 months ago. Pt denies HI but does report that his irritability makes him want to hit something or someone at times but denies recent aggression. He denies AH and reports VH that comes and goes, described as a black figure that is running across the floor. He denies any current VH at this time. Pt reports that he was previously prescribed Zoloft  and Seroquel  which seemed to be effective and denied side effects. Discussed recommendation for inpatient treatment to which he was agreeable to participating in .   During evaluation Dominic Bryant is sitting in assessment room, with head down on table, in no acute distress.  He is alert & oriented x 4, calm, cooperative and attentive for this assessment.  His mood is depressed with congruent flat affect.  He has normal speech, and behavior.  Objectively there is no evidence of psychosis/mania or delusional thinking. Pt does not appear to be responding to internal or external stimuli.  Patient is able to converse coherently, goal directed thoughts, no distractibility, or pre-occupation. He endorses suicidal ideations with thoughts to overdose, jump in front of traffic or buy a gun to shoot himself.Pt also reports intermittent VH but denies at this time. He denies current homicidal ideation, psychosis, and paranoia.  Patient answered question appropriately.      Flowsheet Row ED from 04/10/2024 in [facility] ED from 01/27/2024 in [facility] Department at [facility] ED from  12/30/2023 in [facility] Emergency Department at [facility]  C-SSRS RISK CATEGORY High Risk Low Risk No Risk    Psychiatric Specialty Exam  Presentation  General Appearance:Casual  Eye Contact:Minimal  Speech:Clear and Coherent; Normal Rate  Speech  Volume:Decreased  Handedness:Right   Mood and Affect  Mood: Depressed; Dysphoric  Affect: Congruent; Depressed; Flat   Thought Process  Thought Processes: Coherent  Descriptions of Associations:Intact  Orientation:Full (Time, Place and Person)  Thought Content:WDL  Diagnosis of Schizophrenia or Schizoaffective disorder in past: No data recorded  Hallucinations:Visual Reports VH of a black figure going across the floor at times.  Ideas of Reference:None  Suicidal Thoughts:Yes, Active With Plan; Without Intent  Homicidal Thoughts:No   Sensorium  Memory: Recent Fair; Immediate Good  Judgment: Fair  Insight: Fair   Chartered certified accountant: Fair  Attention Span: Fair  Recall: [employer] of Knowledge: Fair  Language: Fair   Psychomotor Activity  Psychomotor Activity: Normal   Assets  Assets: Desire for Improvement; Housing; Physical Health; Resilience; Social Support   Sleep  Sleep: Fair  Number of hours:  5   Physical Exam: Physical Exam Vitals and nursing note reviewed.  Constitutional:      Appearance: Normal appearance.  HENT:     Head: Normocephalic.     Nose: Nose normal.  Eyes:     Extraocular Movements: Extraocular movements intact.  Cardiovascular:     Rate and Rhythm: Bradycardia present.  Pulmonary:     Effort: Pulmonary effort is normal.  Musculoskeletal:        General: Normal range of motion.     Cervical back: Normal range of motion.  Neurological:     General: No focal deficit present.     Mental Status: He is alert and oriented to person, place, and time.    Review of Systems  Constitutional: Negative.   HENT: Negative.    Eyes: Negative.   Respiratory: Negative.    Cardiovascular: Negative.   Gastrointestinal: Negative.   Genitourinary: Negative.   Musculoskeletal: Negative.   Neurological: Negative.   Endo/Heme/Allergies: Negative.   Psychiatric/Behavioral:  Positive for depression,  substance abuse and suicidal ideas.    Blood pressure (!) 98/56, pulse (!) 58, temperature 97.8 F (36.6 C), temperature source Oral, resp. rate 16, SpO2 97%. There is no height or weight on file to calculate BMI.  Musculoskeletal: Strength & Muscle Tone: within normal limits Gait & Station: normal Patient leans: N/A   [facility] MSE Discharge Disposition for Follow up and Recommendations: Based on my evaluation I certify that psychiatric inpatient services furnished can reasonably be expected to improve the patient's condition which I recommend transfer to an appropriate accepting facility.  Pt recommended for inpatient psychiatric hospitalization for mood stabilization and safety.  Pt currently endorsing suicidal ideations with thoughts to overdose, jump in front of traffic or buy a gun to shoot himself. Pt also reports intermittent VH. Pt has hx of prior suicide attempt in 2024.Pt is voluntary and agrees to this treatment plan.   Treatment Plan: - Admit to continuous assessment until appropriate inpt bed is found.  - Initiate agitation protocol per policy.  - Labs, EKG,  and UDS ordered to assess medical status, r/o physiologic causes and assess for metabolic syndrome.  - Restart Zoloft  50mg   daily for depression with plan to titrate as pt was previously taking Zoloft  200mg  months ago. - Restart Seroquel  XR 100mg  at bedtime for mood stabilization and psychotic symptoms with plans to titrate  as pt was previously taking Seroquel  XR 300mg  months ago.  - Continue Claritin 10mg  at bedtime PRN for seasonal allergies  - Continue IBU 400mg  QID PRN for chronic back pain - Order Hydroxyzine  25mg  TID PRN for anxiety. - Order Trazodone  50mg  at bedtime PRN for sleep - Order Nicotine patch daily for nicotine cessation   Disposition: Pt accepted to [facility] for inpatient treatment.   Alan JAYSON Mcardle, NP 04/10/2024, 10:22 AM

## 2024-04-10 NOTE — Plan of Care (Signed)
  Problem: Education: Goal: Emotional status will improve Outcome: Not Progressing Goal: Mental status will improve Outcome: Not Progressing Goal: Verbalization of understanding the information provided will improve Outcome: Not Progressing   Problem: Activity: Goal: Interest or engagement in activities will improve Outcome: Not Progressing Goal: Sleeping patterns will improve Outcome: Not Progressing

## 2024-04-10 NOTE — Assessment & Plan Note (Addendum)
 Chronic, not controlled. He was late for his appointment and was told he would have to reschedule. He went back to the car and slumped down and started crying. He states that he is having a mental breakdown. Upon getting back into exam room, he is very tearful, withdrawn. He states that he is having multiple suicidal thoughts - jumping off a bridge, crashing his car and doesn't want to live anymore. He stopped taking his medications in July and has not been seeing psychiatry. Called 911 for resource officer to evaluate and bring to behavioral health. While waiting with CMA in the room, he did get agitated and hit his head against the wall. The resource officers brought patient with their vehicle to behavioral health urgent care. His mother was called and updated on what happened and the plan.

## 2024-04-10 NOTE — Discharge Instructions (Addendum)
 Pt transferred to [facility] for inpatient treatment.

## 2024-04-11 ENCOUNTER — Encounter (HOSPITAL_COMMUNITY): Payer: Self-pay

## 2024-04-11 ENCOUNTER — Encounter (HOSPITAL_COMMUNITY): Payer: Self-pay | Admitting: Psychiatry

## 2024-04-11 DIAGNOSIS — F332 Major depressive disorder, recurrent severe without psychotic features: Secondary | ICD-10-CM

## 2024-04-11 NOTE — Progress Notes (Signed)
(  Sleep Hours) -9.5 (Any PRNs that were needed, meds refused, or side effects to meds)- prn trazodone  and hydroxyzine  @ 2156 (Any disturbances and when (visitation, over night)-none (Concerns raised by the patient)-none  (SI/HI/AVH)- Pt states he has been thinking suicidal thought. Pt contracts for safety, denies HI/AVH

## 2024-04-11 NOTE — Plan of Care (Signed)
   Problem: Education: Goal: Knowledge of [place] [facility] Education information/materials will improve Outcome: Progressing Goal: Emotional status will improve Outcome: Progressing Goal: Mental status will improve Outcome: Progressing Goal: Verbalization of understanding the information provided will improve Outcome: Progressing

## 2024-04-11 NOTE — BHH Group Notes (Signed)
 Adult Psychoeducational Group Note  Date:  04/11/2024 Time:  9:23 PM  Group Topic/Focus:  Wrap-Up Group:   The focus of this group is to help patients review their daily goal of treatment and discuss progress on daily workbooks.  Participation Level:  Active  Participation Quality:  Appropriate  Affect:  Appropriate  Cognitive:  Appropriate  Insight: Appropriate  Engagement in Group:  Engaged  Modes of Intervention:  Discussion  Additional Comments:  Pt told that today was a good day on the unit, the highlight of which was having his coffee. On the subject of goals for the coming week, Pt mentioned wanting to get started back on medications that had worked well for him previously.Pt rated his day a 3 out of 10.  Aisha Celestine Ruth 04/11/2024, 9:23 PM

## 2024-04-11 NOTE — Group Note (Signed)
 Date:  04/11/2024 Time:  5:09 PM  Group Topic/Focus: Identifying resources and support systems  Crisis Planning:   The purpose of this group is to help patients create a crisis plan for use upon discharge or in the future, as needed.    Participation Level:  Did Not Attend    Shanda JONETTA Challenger 04/11/2024, 5:09 PM

## 2024-04-11 NOTE — BHH Suicide Risk Assessment (Signed)
 [facility] Admission Suicide Risk Assessment  Principal Problem: Major depressive disorder, recurrent severe without psychotic features (HCC) Diagnosis:  Principal Problem:   Major depressive disorder, recurrent severe without psychotic features (HCC)   Suicide risk assessment: The patient presents with acute risk factors of depressed mood, suicidal thoughts, and being off psychiatric medications. He additionally carries chronic risk factors including male gender, history of trauma, history of recurrent suicidal ideation (especially in times of stress), historic poor coping, prior suicide attempts (reports 5 lifetime) with most recent attempt via jumping off a bridge leading to serious injuries. Today he presents with ongoing passive SI, low mood symptoms and objective blunted/depressed affect. Although he does have some protective factors including housing, family support and help-seeking behavior, current risk of suicide is considered moderate-high.  We will address suicide risk by ongoing admission to psychiatry, restarting home medications, providing group and individual therapy and assistance with safety planning prior to discharge.   I certify that inpatient services furnished can reasonably be expected to improve the patient's condition.   Leita LOISE Arts, MD 04/11/2024, 3:37 PM

## 2024-04-11 NOTE — Plan of Care (Signed)
   Problem: Education: Goal: Knowledge of [place] [facility] Education information/materials will improve Outcome: Progressing Goal: Emotional status will improve Outcome: Progressing Goal: Mental status will improve Outcome: Progressing
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
## 2024-04-11 NOTE — Progress Notes (Signed)
   04/11/24 1008  Psych Admission Type (Psych Patients Only)  Admission Status Voluntary  Psychosocial Assessment  Patient Complaints Anxiety;Depression  Eye Contact Poor  Facial Expression Flat  Affect Sad;Depressed  Speech Logical/coherent  Interaction Guarded  Motor Activity Slow  Appearance/Hygiene Unremarkable  Behavior Characteristics Calm;Guarded  Mood Depressed;Sad  Thought Process  Coherency WDL  Content WDL  Delusions None reported or observed  Perception WDL  Hallucination None reported or observed  Judgment Impaired  Confusion None  Danger to Self  Current suicidal ideation? Passive  Self-Injurious Behavior Some self-injurious ideation observed or expressed.  No lethal plan expressed   Agreement Not to Harm Self Yes  Description of Agreement Pt verbally contracts for safety  Danger to Others  Danger to Others None reported or observed
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
## 2024-04-11 NOTE — BH IP Treatment Plan (Signed)
 Interdisciplinary Treatment and Diagnostic Plan Update  04/11/2024 Time of Session: 10:15 AM Dominic Bryant MRN: [id number]  Principal Diagnosis: MDD (major depressive disorder), single episode, severe with psychotic features (HCC)  Secondary Diagnoses: Principal Problem:   MDD (major depressive disorder), single episode, severe with psychotic features (HCC)   Current Medications:  Current Facility-Administered Medications  Medication Dose Route Frequency Provider Last Rate Last Admin   acetaminophen  (TYLENOL ) tablet 650 mg  650 mg Oral Q6H PRN Brent, Amanda C, NP       alum & mag hydroxide-simeth (MAALOX/MYLANTA) 200-200-20 MG/5ML suspension 30 mL  30 mL Oral Q4H PRN Brent, Amanda C, NP       haloperidol  (HALDOL ) tablet 5 mg  5 mg Oral TID PRN Brent, Amanda C, NP       And   diphenhydrAMINE  (BENADRYL ) capsule 50 mg  50 mg Oral TID PRN Brent, Amanda C, NP       haloperidol  lactate (HALDOL ) injection 5 mg  5 mg Intramuscular TID PRN Brent, Amanda C, NP       And   diphenhydrAMINE  (BENADRYL ) injection 50 mg  50 mg Intramuscular TID PRN Brent, Amanda C, NP       And   LORazepam  (ATIVAN ) injection 2 mg  2 mg Intramuscular TID PRN Brent, Amanda C, NP       haloperidol  lactate (HALDOL ) injection 10 mg  10 mg Intramuscular TID PRN Thresa Alan BROCKS, NP       And   diphenhydrAMINE  (BENADRYL ) injection 50 mg  50 mg Intramuscular TID PRN Brent, Amanda C, NP       And   LORazepam  (ATIVAN ) injection 2 mg  2 mg Intramuscular TID PRN Brent, Amanda C, NP       hydrOXYzine  (ATARAX ) tablet 25 mg  25 mg Oral TID PRN Brent, Amanda C, NP   25 mg at 04/10/24 2156   ibuprofen  (ADVIL ) tablet 400 mg  400 mg Oral Q6H PRN Brent, Amanda C, NP       loratadine (CLARITIN) tablet 10 mg  10 mg Oral QHS PRN Brent, Amanda C, NP       magnesium  hydroxide (MILK OF MAGNESIA) suspension 30 mL  30 mL Oral Daily PRN Brent, Amanda C, NP       nicotine (NICODERM CQ - dosed in mg/24 hours) patch 21 mg  21 mg Transdermal Q0600  Brent, Amanda C, NP       QUEtiapine  (SEROQUEL  XR) 24 hr tablet 100 mg  100 mg Oral QHS Brent, Amanda C, NP   100 mg at 04/10/24 2156   sertraline  (ZOLOFT ) tablet 50 mg  50 mg Oral Daily Brent, Amanda C, NP   50 mg at 04/11/24 1003   traZODone  (DESYREL ) tablet 50 mg  50 mg Oral QHS PRN Brent, Amanda C, NP   50 mg at 04/10/24 2156   PTA Medications: Medications Prior to Admission  Medication Sig Dispense Refill Last Dose/Taking   celecoxib  (CELEBREX ) 200 MG capsule Take 1 capsule (200 mg total) by mouth daily. (Patient not taking: Reported on 04/10/2024) 30 capsule 0    levocetirizine (XYZAL ) 5 MG tablet Take 1 tablet (5 mg total) by mouth every evening. (Patient not taking: Reported on 10/11/2023) 30 tablet 1    loperamide  (IMODIUM ) 2 MG capsule Take 2 capsules (4 mg total) by mouth as needed for diarrhea or loose stools. (Patient not taking: Reported on 04/10/2024) 30 capsule 0    QUEtiapine  (SEROQUEL  XR) 300 MG 24 hr tablet  Take 1 tablet (300 mg total) by mouth at bedtime. (Patient not taking: Reported on 04/10/2024) 30 tablet 1    sertraline  (ZOLOFT ) 100 MG tablet Take 2 tablets (200 mg total) by mouth daily. 60 tablet 0     Patient Stressors: Other: Had no answers    Patient Strengths: Other: no answer  Treatment Modalities: Medication Management, Group therapy, Case management,  1 to 1 session with clinician, Psychoeducation, Recreational therapy.   Physician Treatment Plan for Primary Diagnosis: MDD (major depressive disorder), single episode, severe with psychotic features (HCC) Long Term Goal(s):     Short Term Goals:    Medication Management: Evaluate patient's response, side effects, and tolerance of medication regimen.  Therapeutic Interventions: 1 to 1 sessions, Unit Group sessions and Medication administration.  Evaluation of Outcomes: Not Progressing  Physician Treatment Plan for Secondary Diagnosis: Principal Problem:   MDD (major depressive disorder), single episode,  severe with psychotic features (HCC)  Long Term Goal(s):     Short Term Goals:       Medication Management: Evaluate patient's response, side effects, and tolerance of medication regimen.  Therapeutic Interventions: 1 to 1 sessions, Unit Group sessions and Medication administration.  Evaluation of Outcomes: Not Progressing   RN Treatment Plan for Primary Diagnosis: MDD (major depressive disorder), single episode, severe with psychotic features (HCC) Long Term Goal(s): Knowledge of disease and therapeutic regimen to maintain health will improve  Short Term Goals: Ability to remain free from injury will improve, Ability to verbalize frustration and anger appropriately will improve, Ability to demonstrate self-control, Ability to participate in decision making will improve, Ability to verbalize feelings will improve, Ability to disclose and discuss suicidal ideas, Ability to identify and develop effective coping behaviors will improve, and Compliance with prescribed medications will improve  Medication Management: RN will administer medications as ordered by provider, will assess and evaluate patient's response and provide education to patient for prescribed medication. RN will report any adverse and/or side effects to prescribing provider.  Therapeutic Interventions: 1 on 1 counseling sessions, Psychoeducation, Medication administration, Evaluate responses to treatment, Monitor vital signs and CBGs as ordered, Perform/monitor CIWA, COWS, AIMS and Fall Risk screenings as ordered, Perform wound care treatments as ordered.  Evaluation of Outcomes: Not Progressing   LCSW Treatment Plan for Primary Diagnosis: MDD (major depressive disorder), single episode, severe with psychotic features (HCC) Long Term Goal(s): Safe transition to appropriate next level of care at discharge, Engage patient in therapeutic group addressing interpersonal concerns.  Short Term Goals: Engage patient in aftercare  planning with referrals and resources, Increase social support, Increase ability to appropriately verbalize feelings, Increase emotional regulation, Facilitate acceptance of mental health diagnosis and concerns, Facilitate patient progression through stages of change regarding substance use diagnoses and concerns, Identify triggers associated with mental health/substance abuse issues, and Increase skills for wellness and recovery  Therapeutic Interventions: Assess for all discharge needs, 1 to 1 time with Social worker, Explore available resources and support systems, Assess for adequacy in community support network, Educate family and significant other(s) on suicide prevention, Complete Psychosocial Assessment, Interpersonal group therapy.  Evaluation of Outcomes: Not Progressing   Progress in Treatment: Attending groups: No. Participating in groups: No. Taking medication as prescribed: patient hasn't been prescribed any medications yet Toleration medication:  N/A Family/Significant other contact made: No, will contact:  consents are pending Patient understands diagnosis: Yes. Discussing patient identified problems/goals with staff: Yes. Medical problems stabilized or resolved: Yes. Denies suicidal/homicidal ideation: Yes. Issues/concerns per patient self-inventory: No.  New problem(s) identified:  No  New Short Term/Long Term Goal(s):    medication stabilization, elimination of SI thoughts, development of comprehensive mental wellness plan.    Patient Goals:  I want to get back on my medications.    Discharge Plan or Barriers:  Patient recently admitted. CSW will continue to follow and assess for appropriate referrals and possible discharge planning.    Reason for Continuation of Hospitalization: Depression Medication stabilization Suicidal ideation  Estimated Length of Stay:  5 - 7 days  Last 3 [place] Suicide Severity Risk Score: Flowsheet Row Admission (Current) [facility]  [facility] Most recent reading at 04/10/2024  4:00 PM ED from 04/10/2024 in [facility] Most recent reading at 04/10/2024 11:21 AM ED from 01/27/2024 in [facility] Emergency Department at [facility] Most recent reading at 01/27/2024 11:18 PM  C-SSRS RISK CATEGORY High Risk High Risk Low Risk    Last PHQ 2/9 Scores:    10/11/2023    1:25 PM 08/30/2023    1:23 PM 07/09/2023    3:25 PM  Depression screen PHQ 2/9  Decreased Interest 1 3 2   Down, Depressed, Hopeless 3 3 3   PHQ - 2 Score 4 6 5   Altered sleeping 3 3 3   Tired, decreased energy 3 3 1   Change in appetite 3 3 2   Feeling bad or failure about yourself  2 3 3   Trouble concentrating 3 3 1   Moving slowly or fidgety/restless 2 3 1   Suicidal thoughts 3 3 3   PHQ-9 Score 23 27 19   Difficult doing work/chores Very difficult  Very difficult    Scribe for Treatment Team: Joyceline Maiorino O Samreen Seltzer, LCSWA 04/11/2024 11:03 AM
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
## 2024-04-12 MED ORDER — LIDOCAINE 5 % EX PTCH
1.0000 | MEDICATED_PATCH | Freq: Every day | CUTANEOUS | Status: DC | PRN
  Administered 2024-04-16: 1 via TRANSDERMAL
  Filled 2024-04-12: qty 1

## 2024-04-12 MED ORDER — SERTRALINE HCL 50 MG PO TABS
50.0000 mg | ORAL_TABLET | Freq: Every day | ORAL | Status: AC
  Administered 2024-04-12: 50 mg via ORAL
  Filled 2024-04-12: qty 1

## 2024-04-12 MED ORDER — SERTRALINE HCL 100 MG PO TABS
100.0000 mg | ORAL_TABLET | Freq: Every day | ORAL | Status: DC
  Administered 2024-04-13 – 2024-04-15 (×3): 100 mg via ORAL
  Filled 2024-04-12 (×3): qty 1

## 2024-04-12 NOTE — Group Note (Signed)
 Date:  04/12/2024 Time:  6:56 PM  Group Topic/Focus:  Making Healthy Choices:   The focus of this group is to help patients identify negative/unhealthy choices they were using prior to admission and identify positive/healthier coping strategies to replace them upon discharge. Self Care:   The focus of this group is to help patients understand the importance of self-care in order to improve or restore emotional, physical, spiritual, interpersonal, and financial health. Gut health: Taught about what the microbiome of the intestines and how it affects our physical and mental health. Taught how to care for it.    Participation Level:  Active  Participation Quality:  Appropriate  Affect:  Appropriate  Cognitive:  Appropriate  Insight: Appropriate  Engagement in Group:  Engaged  Modes of Intervention:  Discussion and Education  Additional Comments:    Dominic Bryant 04/12/2024, 6:56 PM

## 2024-04-12 NOTE — Progress Notes (Signed)
(  Sleep Hours) - 9 (Any PRNs that were needed, meds refused, or side effects to meds)- none  (Any disturbances and when (visitation, over night)- none (Concerns raised by the patient)- none  (SI/HI/AVH)- denies
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
## 2024-04-12 NOTE — Plan of Care (Signed)

## 2024-04-12 NOTE — Group Note (Signed)
 [facility] LCSW Group Therapy Note  04/12/2024  10:00-11:00AM  Type of Therapy and Topic:  Group Therapy:  Building Supports and Asking for Help  Participation Level:  Active   Description of Group:  Patients in this group wished to discuss whether it is a weakness to ask for help.  Processing of this question and related matters took place while group facilitator ensured that all patients had an opportunity to speak, responses were on topic, and the direction of the discussion was positive in nature.    Therapeutic Goals: 1)  discuss why asking for help can be a strength rather than a weakness  2)  explore individual patients' experience in asking for assistance  3)  encourage mutual support among group members  4)  demonstrate the importance of adding supports and of setting boundaries   Summary of Patient Progress:  The patient expressed full comprehension of the concepts presented.  Even though he expressed reluctance to speak, he did so multiple times and was told by other group members that what he said resonated strongly with them.   Therapeutic Modalities:   Processing Demonstration  Elgie JINNY Crest, LCSW 04/12/2024 1:38 PM

## 2024-04-12 NOTE — Progress Notes (Signed)
 D: Patient is alert, oriented, depressed, and cooperative. Denies SI, HI, AVH, and verbally contracts for safety. Patient reports he slept fair last night without sleeping medication. Patient reports his appetite as fair, energy level as normal, and concentration as poor. Patient rates his depression 5/10, hopelessness 3/10, and anxiety 1/10.    A: Scheduled medications administered per MD order. Support provided. Patient educated on safety on the unit and medications. Routine safety checks every 15 minutes. Patient stated understanding to tell nurse about any new physical symptoms. Patient understands to tell staff of any needs.     R: No adverse drug reactions noted. Patient remains safe at this time and will continue to monitor.    04/12/24 1100  Psych Admission Type (Psych Patients Only)  Admission Status Voluntary  Psychosocial Assessment  Patient Complaints Anxiety;Depression  Eye Contact Brief  Facial Expression Flat  Affect Depressed  Speech Logical/coherent  Interaction Guarded  Motor Activity Slow  Appearance/Hygiene Unremarkable  Behavior Characteristics Calm;Guarded  Mood Depressed  Thought Process  Coherency WDL  Content WDL  Delusions None reported or observed  Perception WDL  Hallucination None reported or observed  Judgment Impaired  Confusion None  Danger to Self  Current suicidal ideation? Denies  Self-Injurious Behavior No self-injurious ideation or behavior indicators observed or expressed   Agreement Not to Harm Self Yes  Description of Agreement verbal  Danger to Others  Danger to Others None reported or observed

## 2024-04-12 NOTE — Plan of Care (Signed)
   Problem: Education: Goal: Knowledge of [place] [facility] Education information/materials will improve Outcome: Progressing Goal: Emotional status will improve Outcome: Progressing Goal: Mental status will improve Outcome: Progressing Goal: Verbalization of understanding the information provided will improve Outcome: Progressing

## 2024-04-12 NOTE — Group Note (Signed)
 Date:  04/12/2024 Time:  9:03 PM  Group Topic/Focus:  Wrap-Up Group:   The focus of this group is to help patients review their daily goal of treatment and discuss progress on daily workbooks.    Participation Level:  Minimal  Participation Quality:  Attentive  Affect:  Appropriate  Cognitive:  Appropriate  Insight: Improving  Engagement in Group:  Limited  Modes of Intervention:  Discussion  Additional Comments:  Pt attended the evening wrap-up group. Tech introduced the staff for the evening, reminded group of the evening schedule and reminded them to ask for anything they need. Pt read and discussed a story named [employer]. Pt listened attentively.  Dominic [place] 04/12/2024, 9:03 PM
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
## 2024-04-12 NOTE — Group Note (Signed)
 Date:  04/12/2024 Time:  9:18 AM  Group Topic/Focus:  Goals Group:   The focus of this group is to help patients establish daily goals to achieve during treatment and discuss how the patient can incorporate goal setting into their daily lives to aide in recovery. Orientation:   The focus of this group is to educate the patient on the purpose and policies of crisis stabilization and provide a format to answer questions about their admission.  The group details unit policies and expectations of patients while admitted.    Participation Level:  Active  Participation Quality:  Appropriate  Affect:  Appropriate  Cognitive:  Appropriate  Insight: Appropriate  Engagement in Group:  Engaged  Modes of Intervention:  Orientation  Additional Comments:  goal is to read  Dominic Bryant 04/12/2024, 9:18 AM

## 2024-04-13 NOTE — Plan of Care (Signed)
   Problem: Education: Goal: Knowledge of [place] [facility] Education information/materials will improve Outcome: Progressing Goal: Emotional status will improve Outcome: Progressing Goal: Mental status will improve Outcome: Progressing Goal: Verbalization of understanding the information provided will improve Outcome: Progressing

## 2024-04-13 NOTE — Group Note (Signed)
 Date:  04/13/2024 Time:  1:03 PM  Group Topic/Focus:  Emotional Education:  The group explored themes of acceptance, change, resilience, and control through song analysis and a lyric substitution activity. The goal of the group is to increase self awareness, emotional expression, autonomy, and self-esteem.   Participation Level:  Active  Participation Quality:  Appropriate  Affect:  Appropriate  Cognitive:  Appropriate  Insight: Appropriate  Engagement in Group:  Developing/Improving  Modes of Intervention:  Activity and Discussion  Additional Comments:    Dominic Bryant Sheilah Rayos 04/13/2024, 1:03 PM

## 2024-04-13 NOTE — Group Note (Signed)
 Date:  04/13/2024 Time:  9:32 AM  Group Topic/Focus:  Goals Group:   The focus of this group is to help patients establish daily goals to achieve during treatment and discuss how the patient can incorporate goal setting into their daily lives to aide in recovery. Orientation:   The focus of this group is to educate the patient on the purpose and policies of crisis stabilization and provide a format to answer questions about their admission.  The group details unit policies and expectations of patients while admitted.    Participation Level:  Active  Participation Quality:  Appropriate  Affect:  Appropriate  Cognitive:  Appropriate  Insight: Appropriate  Engagement in Group:  Engaged  Modes of Intervention:  Discussion  Additional Comments:    Rashed Edler D Roselyne Stalnaker 04/13/2024, 9:32 AM

## 2024-04-13 NOTE — Group Note (Signed)
 Date:  04/13/2024 Time:  6:51 PM  Group Topic/Focus:  Making Healthy Choices:   The focus of this group is to help patients identify negative/unhealthy choices they were using prior to admission and identify positive/healthier coping strategies to replace them upon discharge. Relapse Prevention Planning:   The focus of this group is to define relapse and discuss the need for planning to combat relapse.  Watched video of addiction behavior and it's triggers and possible solutions. Gave handouts with description of video and thought provoking questons. Personal sharing and audience sharing.    Participation Level:  Active  Participation Quality:  Appropriate  Affect:  Appropriate  Cognitive:  Appropriate  Insight: Appropriate  Engagement in Group:  Engaged  Modes of Intervention:  Discussion and Education  Additional Comments:    Dominic Bryant 04/13/2024, 6:51 PM

## 2024-04-13 NOTE — Progress Notes (Signed)
(  Sleep Hours) - 9hrs (Any PRNs that were needed, meds refused, or side effects to meds)-  none (Any disturbances and when (visitation, over night)- none (Concerns raised by the patient)- none (SI/HI/AVH)- denies

## 2024-04-13 NOTE — Progress Notes (Signed)
   04/13/24 0800  Psych Admission Type (Psych Patients Only)  Admission Status Voluntary  Psychosocial Assessment  Patient Complaints Anxiety;Depression  Eye Contact Brief  Facial Expression Flat  Affect Depressed  Speech Logical/coherent  Interaction Guarded  Motor Activity Slow  Appearance/Hygiene In scrubs;Disheveled  Behavior Characteristics Anxious;Irritable  Mood Depressed;Anxious  Thought Process  Coherency WDL  Content WDL  Delusions None reported or observed  Perception WDL  Hallucination None reported or observed  Judgment Impaired  Confusion None  Danger to Self  Current suicidal ideation? Denies  Self-Injurious Behavior No self-injurious ideation or behavior indicators observed or expressed   Agreement Not to Harm Self Yes  Description of Agreement verbal  Danger to Others  Danger to Others None reported or observed

## 2024-04-13 NOTE — Plan of Care (Signed)
  Problem: Education: Goal: Emotional status will improve 04/13/2024 1951 by Cresenciano Joaquin KIDD, RN Outcome: Progressing 04/13/2024 1634 by Cresenciano Joaquin KIDD, RN Outcome: Progressing   Problem: Education: Goal: Mental status will improve 04/13/2024 1951 by Cresenciano Joaquin KIDD, RN Outcome: Progressing 04/13/2024 1634 by Cresenciano Joaquin KIDD, RN Outcome: Progressing   Problem: Activity: Goal: Interest or engagement in activities will improve 04/13/2024 1951 by Cresenciano Joaquin KIDD, RN Outcome: Progressing 04/13/2024 1634 by Cresenciano Joaquin KIDD, RN Outcome: Progressing

## 2024-04-13 NOTE — [facility] Suicide Risk Assessment (Signed)
 [facility] INPATIENT:  Family/Significant Other Suicide Prevention Education  Suicide Prevention Education:  Education Completed; mother Nathanel Platt [phone],  has been identified by the patient as the family member/significant other with whom the patient will be residing, and identified as the person(s) who will aid the patient in the event of a mental health crisis (suicidal ideations/suicide attempt).  With written consent from the patient, the family member/significant other has been provided the following suicide prevention education, prior to the and/or following the discharge of the patient.  The suicide prevention education provided includes the following: Suicide risk factors Suicide prevention and interventions National Suicide Hotline telephone number [facility] assessment telephone number [facility] Emergency Assistance 911 [facility] and/or Residential Mobile Crisis Unit telephone number  Request made of family/significant other to: Remove weapons (e.g., guns, rifles, knives), all items previously/currently identified as safety concern.   Remove drugs/medications (over-the-counter, prescriptions, illicit drugs), all items previously/currently identified as a safety concern.  The family member/significant other verbalizes understanding of the suicide prevention education information provided.  The family member/significant other agrees to remove the items of safety concern listed above.  Roselyn GORMAN Lento 04/13/2024, 9:32 AM

## 2024-04-13 NOTE — Plan of Care (Signed)
  Problem: Activity: Goal: Interest or engagement in activities will improve Outcome: Progressing   Problem: Coping: Goal: Ability to verbalize frustrations and anger appropriately will improve Outcome: Progressing   Problem: Physical Regulation: Goal: Ability to maintain clinical measurements within normal limits will improve Outcome: Progressing
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
## 2024-04-13 NOTE — [facility] Group Notes (Signed)
 [facility] Group Notes:  (Nursing/MHT/Case Management/Adjunct)  Date:  04/13/2024  Time:  8:48 PM  Type of Therapy:  Group Therapy  Participation Level:  Active  Participation Quality:  Appropriate  Affect:  Appropriate  Cognitive:  Alert and Appropriate  Insight:  Appropriate and Good  Engagement in Group:  Supportive  Modes of Intervention:  Socialization and Support  Summary of Progress/Problems:  Dominic Bryant 04/13/2024, 8:48 PM

## 2024-04-14 NOTE — Group Note (Signed)
 Date:  04/14/2024 Time:  3:40 PM  Group Topic/Focus: Occupational Therapy Group Occupational Therapy    Participation Level:  Active  Participation Quality:  Appropriate  Affect:  Appropriate  Cognitive:  Appropriate  Insight: Appropriate  Engagement in Group:  Engaged  Modes of Intervention:  Discussion  Additional Comments:  Patient attended group.   Ebany Bowermaster D Sandro Burgo 04/14/2024, 3:40 PM

## 2024-04-14 NOTE — Group Note (Signed)
 Recreation Therapy Group Note   Group Topic:Problem Solving  Group Date: 04/14/2024 Start Time: 0941 End Time: 1002 Facilitators: Woodard Perrell-McCall, LRT,CTRS Location: [address] Dayroom   Group Topic: Communication, Team Building, Problem Solving  Goal Area(s) Addresses:  Patient will effectively work with peer towards shared goal.  Patient will identify skills used to make activity successful.  Patient will identify how skills used during activity can be used to reach post d/c goals.   Behavioral Response: Engaged  Intervention: STEM Activity  Activity: Straw Bridge. In teams of 3-5, patients were given 15 plastic drinking straws and an equal length of masking tape. Using the materials provided, patients were instructed to build a free standing bridge-like structure to suspend an everyday item (ex: puzzle box) off of the floor or table surface. All materials were required to be used by the team in their design. LRT facilitated post-activity discussion reviewing team process. Patients were encouraged to reflect how the skills used in this activity can be generalized to daily life post discharge.   Education: Pharmacist, community, Scientist, physiological, Discharge Planning   Education Outcome: Acknowledges education/In group clarification offered/Needs additional education.    Affect/Mood: Appropriate   Participation Level: Engaged   Participation Quality: Independent   Behavior: Appropriate   Speech/Thought Process: Focused   Insight: Good   Judgement: Good   Modes of Intervention: STEM Activity   Patient Response to Interventions:  Engaged   Education Outcome:  In group clarification offered    Clinical Observations/Individualized Feedback: Pt was really focused and attentive in completing the activity. Pt was making suggestions to adjust or add to the bridge while focusing on how it was being built. Pt was engaged throughout group.   Plan: Continue to engage patient in  RT group sessions 2-3x/week.   Rimsha Trembley-McCall, LRT,CTRS 04/14/2024 11:13 AM

## 2024-04-14 NOTE — Progress Notes (Signed)
 Spiritual care group on grief and loss facilitated by Chaplain Rockie Sofia, Bcc  Group Goal: Support / Education around grief and loss  Members engage in facilitated group support and psycho-social education.  Group Description:  Following introductions and group rules, group members engaged in facilitated group dialogue and support around topic of loss, with particular support around experiences of loss in their lives. Group Identified types of loss (relationships / self / things) and identified patterns, circumstances, and changes that precipitate losses. Reflected on thoughts / feelings around loss, normalized grief responses, and recognized variety in grief experience. Group encouraged individual reflection on safe space and on the coping skills that they are already utilizing.  Group drew on Adlerian / Rogerian and narrative framework  Patient Progress: Dominic Bryant attended group and engaged and participated in group conversation and activities.

## 2024-04-14 NOTE — Plan of Care (Signed)
  Problem: Education: Goal: Verbalization of understanding the information provided will improve Outcome: Progressing   Problem: Activity: Goal: Interest or engagement in activities will improve Outcome: Progressing   Problem: Activity: Goal: Sleeping patterns will improve Outcome: Progressing
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
## 2024-04-14 NOTE — Group Note (Signed)
 Date:  04/14/2024 Time:  10:46 AM  Group Topic/Focus:  wellness group     Participation Level:  Active   Additional Comments:  attended group  [employer] 04/14/2024, 10:46 AM

## 2024-04-14 NOTE — Group Note (Signed)
 Date:  04/14/2024 Time:  9:53 AM  Group Topic/Focus:  Goals Group:   The focus of this group is to help patients establish daily goals to achieve during treatment and discuss how the patient can incorporate goal setting into their daily lives to aide in recovery.    Participation Level:  Active  Participation Quality:  Appropriate  Affect:  Appropriate  Cognitive:  Appropriate  Insight: Appropriate  Engagement in Group:  Improving  Modes of Intervention:  Orientation  Additional Comments:  Dominic Bryant attended group and his goal is to learn how to be prepared in life  [name] 04/14/2024, 9:53 AM

## 2024-04-14 NOTE — Progress Notes (Signed)
(  Sleep Hours) -8 (Any PRNs that were needed, meds refused, or side effects to meds)- none  (Any disturbances and when (visitation, over night)- n/a (Concerns raised by the patient)- none (SI/HI/AVH)- denies

## 2024-04-14 NOTE — Group Note (Signed)
 Occupational Therapy Group Note  Group Topic: Sleep Hygiene  Group Date: 04/14/2024 Start Time: 1500 End Time: 1538 Facilitators: Dot [place] MATSU, OT   Group Description: Group encouraged increased participation and engagement through topic focused on sleep hygiene. Patients reflected on the quality of sleep they typically receive and identified areas that need improvement. Group was given background information on sleep and sleep hygiene, including common sleep disorders. Group members also received information on how to improve one's sleep and introduced a sleep diary as a tool that can be utilized to track sleep quality over a length of time. Group session ended with patients identifying one or more strategies they could utilize or implement into their sleep routine in order to improve overall sleep quality.        Therapeutic Goal(s):  Identify one or more strategies to improve overall sleep hygiene  Identify one or more areas of sleep that are negatively impacted (sleep too much, too little, etc)     Participation Level: Engaged   Participation Quality: Independent   Behavior: Appropriate   Speech/Thought Process: Relevant   Affect/Mood: Appropriate   Insight: Fair   Judgement: Fair      Modes of Intervention: Education  Patient Response to Interventions:  Attentive   Plan: Continue to engage patient in OT groups 2 - 3x/week.  04/14/2024  [place] MATSU Dot, OT   Amber Guthridge, OT

## 2024-04-14 NOTE — [facility] Group Notes (Signed)
 [facility] Group Notes:  (Nursing/MHT/Case Management/Adjunct)  Date:  04/14/2024  Time:  10:28 PM  Type of Therapy:  Psychoeducational Skills  Participation Level:  Active  Participation Quality:  Appropriate  Affect:  Appropriate  Cognitive:  Lacking  Insight:  Limited  Engagement in Group:  Engaged  Modes of Intervention:  Education  Summary of Progress/Problems: Patient rated his day as a 7 out of a possible 10. He states that he had a good day but did not go into further detail. His goal for today was to speak with a therapist which he didn't accomplish.   Matsue Strom S 04/14/2024, 10:28 PM

## 2024-04-14 NOTE — Progress Notes (Signed)
   04/14/24 0800  Psych Admission Type (Psych Patients Only)  Admission Status Voluntary  Psychosocial Assessment  Patient Complaints Anxiety;Depression  Eye Contact Brief  Facial Expression Flat  Affect Depressed  Speech Logical/coherent  Interaction Guarded  Motor Activity Slow  Appearance/Hygiene In scrubs;Disheveled  Behavior Characteristics Cooperative  Mood Depressed  Aggressive Behavior  Effect No apparent injury  Thought Process  Coherency WDL  Content WDL  Delusions None reported or observed  Perception WDL  Hallucination None reported or observed  Judgment Impaired  Confusion None  Danger to Self  Current suicidal ideation? Denies  Self-Injurious Behavior No self-injurious ideation or behavior indicators observed or expressed   Agreement Not to Harm Self Yes  Description of Agreement verbal  Danger to Others  Danger to Others None reported or observed

## 2024-04-14 NOTE — Group Note (Signed)
 Date:  04/14/2024 Time:  1:20 PM  Group Topic/Focus:   Chaplin group  Participation Level:  Active  Additional Comments:  Attended group  Gearald Stonebraker M Theodoros Stjames 04/14/2024, 1:20 PM

## 2024-04-15 MED ORDER — SERTRALINE HCL 50 MG PO TABS
150.0000 mg | ORAL_TABLET | Freq: Every day | ORAL | Status: DC
  Administered 2024-04-16 – 2024-04-17 (×2): 150 mg via ORAL
  Filled 2024-04-15 (×3): qty 1

## 2024-04-15 NOTE — Group Note (Signed)
 Date:  04/15/2024 Time:  11:27 AM  Group Topic/Focus:  Pet Therapy:   This group aims to reduce stress and anxiety, improve mood, and increase feelings of comfort using animals.   Participation Level:  Patient did attend group   Dominic Bryant D [name] 04/15/2024, 11:27 AM

## 2024-04-15 NOTE — Group Note (Signed)
 Date:  04/15/2024 Time:  4:23 PM  Group Topic/Focus:  Developing a Wellness Toolbox:   The focus of this group is to help patients develop a wellness toolbox with skills and strategies to promote recovery upon discharge.    Participation Level:  Active  Participation Quality:  Appropriate  Affect:  Appropriate  Cognitive:  Appropriate  Insight: Appropriate  Engagement in Group:  Engaged  Modes of Intervention:  Discussion   Annalee  Wilbern Pennypacker 04/15/2024, 4:23 PM

## 2024-04-15 NOTE — Group Note (Signed)
 Date:  04/15/2024 Time:  9:24 AM  Group Topic/Focus:  Goals Group:   The focus of this group is to help patients establish daily goals to achieve during treatment and discuss how the patient can incorporate goal setting into their daily lives to aide in recovery. Orientation:   The focus of this group is to educate the patient on the purpose and policies of crisis stabilization and provide a format to answer questions about their admission.  The group details unit policies and expectations of patients while admitted.    Participation Level:  Active  Participation Quality:  Appropriate  Affect:  Appropriate  Cognitive:  Appropriate  Insight: Appropriate  Engagement in Group:  Engaged  Modes of Intervention:  Discussion and Orientation  Additional Comments:    Dominic Bryant D Eivin Mascio 04/15/2024, 9:24 AM

## 2024-04-15 NOTE — Group Note (Signed)
 Date:  04/15/2024 Time:  2:01 PM  Group Topic/Focus:  [facility] Group:   The purpose of this group is to discuss setting SMART goals and creating a WRAP plan.   Participation Level:  Active  Participation Quality:  Appropriate  Affect:  Appropriate  Cognitive:  Appropriate  Insight: Appropriate  Engagement in Group:  Engaged  Modes of Intervention:  Discussion and Education  Additional Comments:    Dominic Bryant 04/15/2024, 2:01 PM

## 2024-04-15 NOTE — Progress Notes (Signed)
 [facility] MD Progress Note  04/15/2024 12:36 PM ETHER WOLTERS  MRN:  [id number] Subjective:   Dominic Bryant is a 28 y.o. male  with a past psychiatric history of MDD. Patient initially arrived to [facility] on 10/16 for suicidal ideations with a plan, and admitted to [facility] Voluntary on 10/16 for acute safety concerns and stabilization of acute on chronic psychiatric conditions. PMHx is significant for hypothyroidism, chronic heart murmur.    Case was discussed in the multidisciplinary team. MAR was reviewed and patient was compliant with medications.  He received PRN Advil  yesterday.   Psychiatric Team made the following recommendations yesterday: -Continue Zoloft  100 mg daily for depression -Continue Seroquel  XR 100 mg QHS for insomnia and augmentation   On interview today patient reports he slept good last night.  He reports his appetite is doing good.  He reports no SI, HI, or AVH.  He reports no Paranoia or Ideas of Reference.  He reports no significant issues with his medications, he reports occasionally feeling tingly but is unsure if this is due to the Zoloft ..  Discussed with him that we would continue to increase his Zoloft  and plan for an increase tomorrow and he was agreeable.  He reports no other concerns at present.  Principal Problem: Major depressive disorder, recurrent severe without psychotic features (HCC) Diagnosis: Principal Problem:   Major depressive disorder, recurrent severe without psychotic features (HCC)  Total Time spent with patient:  I personally spent 35 minutes on the unit in direct patient care. The direct patient care time included face-to-face time with the patient, reviewing the patient's chart, communicating with other professionals, and coordinating care.    Past Psychiatric History:  Current Psychiatrist: Dr. Sable Current Therapist: N/A Previous Psychiatric Diagnoses: MDD  Psychiatric Medications: Current None; pt reports not taking in several  months Past Zoloft  300mg  Aripiprazole  300mg  Quetiapine  300mg  XR Psychiatric Hospitalization hx: 5 prior hospitalizations, related to depression and SI  Psychotherapy hx: yes ACT team hx: none Neuromodulation history: none  History of suicide: 5 attempts, most recent 2024 jumped from bridge History of homicide or aggression: aggression history; did not proceed with attack  Past Medical History:  Past Medical History:  Diagnosis Date   Depression    Heart murmur    MDD (major depressive disorder)    Suicide attempt (HCC) 03/11/2023   Jumped off a bridge   Thyroid  disease     Past Surgical History:  Procedure Laterality Date   ESOPHAGOGASTRODUODENOSCOPY (EGD) WITH PROPOFOL  N/A 05/12/2015   Procedure: ESOPHAGOGASTRODUODENOSCOPY (EGD) WITH PROPOFOL ;  Surgeon: Elsie Cree, MD;  Location: [facility] ENDOSCOPY;  Service: Endoscopy;  Laterality: N/A;   EUS N/A 05/12/2015   Procedure: UPPER ENDOSCOPIC ULTRASOUND (EUS) RADIAL;  Surgeon: Elsie Cree, MD;  Location: [facility] ENDOSCOPY;  Service: Endoscopy;  Laterality: N/A;   Family History:  Family History  Problem Relation Age of Onset   Crohn's disease Mother    Asthma Father    Alcohol abuse Father    Family Psychiatric  History:  Father- EtOH Abuse No Known Diagnosis' or Suicides  Social History:  Social History   Substance and Sexual Activity  Alcohol Use Yes   Comment: social     Social History   Substance and Sexual Activity  Drug Use Yes   Types: Marijuana    Social History   Socioeconomic History   Marital status: Single    Spouse name: Not on file   Number of children: Not on file   Years of education:  Not on file   Highest education level: Associate degree: occupational, technical, or vocational program  Occupational History   Not on file  Tobacco Use   Smoking status: Every Day    Current packs/day: 0.50    Average packs/day: 0.5 packs/day for 8.8 years (4.4 ttl pk-yrs)    Types: Cigarettes    Start date:  2017   Smokeless tobacco: Never  Vaping Use   Vaping status: Never Used  Substance and Sexual Activity   Alcohol use: Yes    Comment: social   Drug use: Yes    Types: Marijuana   Sexual activity: Yes    Partners: Female    Birth control/protection: Condom  Other Topics Concern   Not on file  Social History Narrative   Not on file   Social Drivers of Health   Financial Resource Strain: High Risk (04/09/2024)   Overall Financial Resource Strain (CARDIA)    Difficulty of Paying Living Expenses: Hard  Food Insecurity: Food Insecurity Present (04/10/2024)   Hunger Vital Sign    Worried About Running Out of Food in the Last Year: Sometimes true    Ran Out of Food in the Last Year: Sometimes true  Transportation Needs: No Transportation Needs (04/10/2024)   PRAPARE - Administrator, Civil Service (Medical): No    Lack of Transportation (Non-Medical): No  Physical Activity: Sufficiently Active (04/09/2024)   Exercise Vital Sign    Days of Exercise per Week: 5 days    Minutes of Exercise per Session: 30 min  Stress: Stress Concern Present (04/09/2024)   [employer] of Occupational Health - Occupational Stress Questionnaire    Feeling of Stress: Rather much  Social Connections: Socially Isolated (04/09/2024)   Social Connection and Isolation Panel    Frequency of Communication with Friends and Family: Three times a week    Frequency of Social Gatherings with Friends and Family: Twice a week    Attends Religious Services: Never    Database administrator or Organizations: No    Attends Engineer, structural: Not on file    Marital Status: Never married   Additional Social History:                         Sleep: Good Estimated Sleeping Duration (Last 24 Hours): 8.00-8.50 hours  Appetite:  Good  Current Medications: Current Facility-Administered Medications  Medication Dose Route Frequency Provider Last Rate Last Admin   acetaminophen   (TYLENOL ) tablet 650 mg  650 mg Oral Q6H PRN Brent, Amanda C, NP       alum & mag hydroxide-simeth (MAALOX/MYLANTA) 200-200-20 MG/5ML suspension 30 mL  30 mL Oral Q4H PRN Brent, Amanda C, NP       haloperidol  (HALDOL ) tablet 5 mg  5 mg Oral TID PRN Brent, Amanda C, NP       And   diphenhydrAMINE  (BENADRYL ) capsule 50 mg  50 mg Oral TID PRN Brent, Amanda C, NP       haloperidol  lactate (HALDOL ) injection 5 mg  5 mg Intramuscular TID PRN Thresa Alan BROCKS, NP       And   diphenhydrAMINE  (BENADRYL ) injection 50 mg  50 mg Intramuscular TID PRN Brent, Amanda C, NP       And   LORazepam  (ATIVAN ) injection 2 mg  2 mg Intramuscular TID PRN Brent, Amanda C, NP       haloperidol  lactate (HALDOL ) injection 10 mg  10 mg Intramuscular  TID PRN Brent, Amanda C, NP       And   diphenhydrAMINE  (BENADRYL ) injection 50 mg  50 mg Intramuscular TID PRN Brent, Amanda C, NP       And   LORazepam  (ATIVAN ) injection 2 mg  2 mg Intramuscular TID PRN Brent, Amanda C, NP       hydrOXYzine  (ATARAX ) tablet 25 mg  25 mg Oral TID PRN Brent, Amanda C, NP   25 mg at 04/10/24 2156   ibuprofen  (ADVIL ) tablet 400 mg  400 mg Oral Q6H PRN Brent, Amanda C, NP   400 mg at 04/14/24 0818   lidocaine  (LIDODERM ) 5 % 1 patch  1 patch Transdermal Daily PRN Towana Leita SAILOR, MD       loratadine (CLARITIN) tablet 10 mg  10 mg Oral QHS PRN Brent, Amanda C, NP       magnesium  hydroxide (MILK OF MAGNESIA) suspension 30 mL  30 mL Oral Daily PRN Brent, Amanda C, NP       nicotine (NICODERM CQ - dosed in mg/24 hours) patch 21 mg  21 mg Transdermal Q0600 Brent, Amanda C, NP       QUEtiapine  (SEROQUEL  XR) 24 hr tablet 100 mg  100 mg Oral QHS Brent, Amanda C, NP   100 mg at 04/14/24 2057   sertraline  (ZOLOFT ) tablet 100 mg  100 mg Oral Daily Butler, Laura N, MD   100 mg at 04/15/24 0800   traZODone  (DESYREL ) tablet 50 mg  50 mg Oral QHS PRN Brent, Amanda C, NP   50 mg at 04/10/24 2156    Lab Results: No results found for this or any previous visit  (from the past 48 hours).  Blood Alcohol level:  Lab Results  Component Value Date   [facility] <15 04/10/2024   ETH <10 03/11/2023    Metabolic Disorder Labs: Lab Results  Component Value Date   HGBA1C 4.0 (L) 04/10/2024   MPG 68.1 04/10/2024   No results found for: PROLACTIN Lab Results  Component Value Date   CHOL 153 04/10/2024   TRIG 75 04/10/2024   HDL 29 (L) 04/10/2024   CHOLHDL 5.3 04/10/2024   VLDL 15 04/10/2024   LDLCALC 109 (H) 04/10/2024   LDLCALC 131 (H) 07/01/2020    Physical Findings: AIMS:  ,  ,  ,  ,  ,  ,   CIWA:    COWS:     Musculoskeletal: Strength & Muscle Tone: within normal limits Gait & Station: normal Patient leans: N/A  Psychiatric Specialty Exam:  Presentation  General Appearance:  Appropriate for Environment; Casual  Eye Contact: Good  Speech: Clear and Coherent; Normal Rate  Speech Volume: Normal  Handedness: Right   Mood and Affect  Mood: Euthymic  Affect: Congruent; Appropriate   Thought Process  Thought Processes: Coherent; Goal Directed  Descriptions of Associations:Intact  Orientation:Full (Time, Place and Person)  Thought Content:Logical; WDL  History of Schizophrenia/Schizoaffective disorder:No  Duration of Psychotic Symptoms:No data recorded Hallucinations:Hallucinations: None  Ideas of Reference:None  Suicidal Thoughts:Suicidal Thoughts: No  Homicidal Thoughts:Homicidal Thoughts: No   Sensorium  Memory: Immediate Good; Recent Good  Judgment: Good  Insight: Good   Executive Functions  Concentration: Good  Attention Span: Good  Recall: Good  Fund of Knowledge: Good  Language: Good   Psychomotor Activity  Psychomotor Activity: Psychomotor Activity: Normal   Assets  Assets: Communication Skills; Desire for Improvement; Resilience; Social Support; Housing   Sleep  Sleep: Sleep: Good    Physical Exam:  Physical Exam Vitals and nursing note reviewed.   Constitutional:      General: He is not in acute distress.    Appearance: Normal appearance. He is normal weight. He is not ill-appearing or toxic-appearing.  HENT:     Head: Normocephalic and atraumatic.  Pulmonary:     Effort: Pulmonary effort is normal.  Musculoskeletal:        General: Normal range of motion.  Neurological:     General: No focal deficit present.     Mental Status: He is alert.    Review of Systems  Respiratory:  Negative for cough and shortness of breath.   Cardiovascular:  Negative for chest pain.  Gastrointestinal:  Negative for abdominal pain, constipation, diarrhea, nausea and vomiting.  Neurological:  Negative for dizziness, weakness and headaches.  Psychiatric/Behavioral:  Negative for depression, hallucinations and suicidal ideas. The patient is not nervous/anxious.    Blood pressure 108/72, pulse (!) 52, temperature 97.8 F (36.6 C), temperature source Oral, resp. rate 18, height 5' 7 (1.702 m), weight 92.1 kg, SpO2 100%. Body mass index is 31.79 kg/m.   Treatment Plan Summary: Daily contact with patient to assess and evaluate symptoms and progress in treatment and Medication management  CAELEB BATALLA is a 28 y.o. male  with a past psychiatric history of MDD. Patient initially arrived to [facility] on 10/16 for suicidal ideations with a plan, and admitted to [facility]untary on 10/16 for acute safety concerns and stabilization of acute on chronic psychiatric conditions. PMHx is significant for hypothyroidism, chronic heart murmur.     Boluwatife is responding well to the Zoloft  and is more interactive on the unit.  We will continue to increase his Zoloft  tomorrow to 150 mg.  We will not make any other changes to his medications at this time.  We will continue to monitor.      Major depressive disorder, recurrent severe without psychotic features (HCC) Cluster B traits    PLAN: Safety and Monitoring:             -- Voluntary admission to inpatient  psychiatric unit for safety, stabilization and treatment             -- Daily contact with patient to assess and evaluate symptoms and progress in treatment             -- Patient's case to be discussed in multi-disciplinary team meeting             -- Observation Level : q15 minute checks             -- Vital signs:  q12 hours             -- Precautions: suicide, elopement, and assault   2. Psychiatric Treatment:  -Continue Zoloft  100 mg daily for depression -Continue Seroquel  XR 100 mg QHS for insomnia and augmentation -Continue Agitation Protocol: Haldol /Ativan /Benadryl    -Continue PRN's: Tylenol , Maalox, Atarax , Milk of Magnesia, Trazodone      --  The risks/benefits/side-effects/alternatives to this medication were discussed in detail with the patient and time was given for questions. The patient consents to medication trial.              -- Metabolic profile and EKG monitoring obtained while on an atypical antipsychotic. See #4 below for values.              -- Encouraged patient to participate in unit milieu and in scheduled group therapies              --  Short Term Goals: Ability to identify changes in lifestyle to reduce recurrence of condition will improve, Ability to verbalize feelings will improve, Ability to disclose and discuss suicidal ideas, Ability to demonstrate self-control will improve, Ability to identify and develop effective coping behaviors will improve, Ability to maintain clinical measurements within normal limits will improve, Compliance with prescribed medications will improve, and Ability to identify triggers associated with substance abuse/mental health issues will improve             -- Long Term Goals: Improvement in symptoms so as ready for discharge   3. Medical Issues Being Addressed:              -- none              4. Routine and other pertinent labs reviewed: EKG monitoring: QTc:   BMI: Body mass index is 31.79 kg/m. Prolactin: Recent Labs   No results found for: PROLACTIN     Lipid Panel: Recent Labs       Lab Results  Component Value Date    CHOL 153 04/10/2024    TRIG 75 04/10/2024    HDL 29 (L) 04/10/2024    CHOLHDL 5.3 04/10/2024    VLDL 15 04/10/2024    LDLCALC 109 (H) 04/10/2024    LDLCALC 131 (H) 07/01/2020      HbgA1c: Last Labs     Hgb A1c MFr Bld (%)  Date Value  04/10/2024 4.0 (L)        TSH: Last Labs     TSH (uIU/mL)  Date Value  04/10/2024 3.345  10/11/2023 5.88 (H)      Labs to order: none   5. Discharge Planning:              -- Social work and case management to assist with discharge planning and identification of hospital follow-up needs prior to discharge             -- Estimated LOS: 3-5 days              -- Discharge Concerns: Need to establish a safety plan; Medication compliance and effectiveness             -- Discharge Goals: Return home with outpatient referrals for mental health follow-up including medication management/psychotherapy none   Marsa GORMAN Rosser, DO 04/15/2024, 12:36 PM

## 2024-04-15 NOTE — Progress Notes (Signed)
   04/15/24 0000  Psych Admission Type (Psych Patients Only)  Admission Status Voluntary  Psychosocial Assessment  Patient Complaints Anxiety;Depression  Eye Contact Fair  Facial Expression Animated  Affect Appropriate to circumstance  Speech Logical/coherent  Interaction Guarded  Motor Activity Slow  Appearance/Hygiene In scrubs;Disheveled  Behavior Characteristics Cooperative  Mood Pleasant;Depressed  Thought Process  Coherency WDL  Content WDL  Delusions None reported or observed  Perception WDL  Hallucination None reported or observed  Judgment Impaired  Confusion None  Danger to Self  Current suicidal ideation? Denies  Self-Injurious Behavior No self-injurious ideation or behavior indicators observed or expressed   Agreement Not to Harm Self Yes  Description of Agreement Verbal  Danger to Others  Danger to Others None reported or observed

## 2024-04-15 NOTE — Plan of Care (Addendum)
 Pt presents with fair eye contact, congruent affect, logical speech and animated on interactions. Denies SI, HI and AVH when assessed. Reports intermittent sadness and irritable without specific triggers It just happens, there's nothing in particular, I just get irritable. Complain of back and leg pain but declined PRN Tylenol  when offered no, I can manage right now. Pt visible in dayroom at long intervals, attended and participated in scheduled groups. Compliant with medications when offered. Tolerates meals and fluids well. Off unit for meals and activities; returned without issues. Safety checks maintained at Q 15 minutes intervals without issues.   Problem: Activity: Goal: Sleeping patterns will improve Outcome: Progressing   Problem: Coping: Goal: Ability to demonstrate self-control will improve Outcome: Progressing   Problem: Safety: Goal: Periods of time without injury will increase Outcome: Progressing

## 2024-04-15 NOTE — Group Note (Signed)
 LCSW Group Therapy Note   Group Date: 04/15/2024 Start Time: 1300 End Time: 1350   Type of Therapy and Topic:  Group Therapy: Building Healthy Relationships  Participation Level:  Active  Description of Group: This group will address the use of boundaries in their personal lives. Patients will explore why boundaries are important, the difference between healthy and unhealthy boundaries, and negative and postive outcomes of different boundaries and will look at how boundaries can be crossed.  Patients will be encouraged to identify current boundaries in their own lives and identify what kind of boundary is being set. Facilitators will guide patients in utilizing problem-solving interventions to address and correct types boundaries being used and to address when no boundary is being used. Understanding and applying boundaries will be explored and addressed for obtaining and maintaining a balanced life. Patients will be encouraged to explore ways to assertively make their boundaries and needs known to significant others in their lives, using other group members and facilitator for role play, support, and feedback. Objective:  To explore loneliness, boundaries, and safe ways to build relationships. Participants discussed loneliness, healthy connections, and setting boundaries. They explored safe ways to meet people and shared personal experiences. Key insights were reinforced through discussion and quotes.   Goals: Recognize healthy vs. unhealthy relationships. Learn safe ways to connect with others. Strengthen communication and [employer].    Therapeutic Modalities Used: Cognitive Behavioral Therapy (CBT) Elements - Identifying unhealthy relationship patterns, challenging negative thoughts about connection. Dialectical Behavior Therapy (DBT) Elements - Interpersonal effectiveness, setting and maintaining boundaries. Supportive Group Therapy - Peer discussion, shared experiences, and  emotional validation.  Summary of Patient Progress:  Dominic Bryant was very present/active throughout the session and proved open to feedback from CSW and peers. Patient demonstrated great insight into the subject matter, was respectful of peers, and was present throughout the entire session.  Louetta Lame, LCSWA 04/15/2024  2:56 PM

## 2024-04-15 NOTE — Plan of Care (Signed)
   Problem: Education: Goal: Emotional status will improve Outcome: Progressing Goal: Mental status will improve Outcome: Progressing   Problem: Activity: Goal: Interest or engagement in activities will improve Outcome: Progressing

## 2024-04-15 NOTE — Group Note (Signed)
 Recreation Therapy Group Note   Group Topic:Animal Assisted Therapy   Group Date: 04/15/2024 Start Time: 9052 End Time: 1030 Facilitators: Peng Thorstenson-McCall, LRT,CTRS Location: [address] Dayroom   Animal-Assisted Activity (AAA) Program Checklist/Progress Notes Patient Eligibility Criteria Checklist & Daily Group note for Rec Tx Intervention  AAA/T Program Assumption of Risk Form signed by Patient/ or Parent Legal Guardian Yes  Patient is free of allergies or severe asthma Yes  Patient reports no fear of animals Yes  Patient reports no history of cruelty to animals Yes  Patient understands his/her participation is voluntary Yes  Patient washes hands before animal contact Yes  Patient washes hands after animal contact Yes  Behavioral Response: Engaged   Education: Charity fundraiser, Appropriate Animal Interaction   Education Outcome: Acknowledges education.    Affect/Mood: Appropriate   Participation Level: Engaged   Participation Quality: Independent   Behavior: Appropriate   Speech/Thought Process: Focused   Insight: Good   Judgement: Good   Modes of Intervention: Teaching laboratory technician   Patient Response to Interventions:  Engaged   Education Outcome:  In group clarification offered    Clinical Observations/Individualized Feedback: Patient attended session and interacted appropriately with therapy dog and peers. Patient asked appropriate questions about therapy dog and his training. Patient shared stories about their pets at home with group.     Plan: Continue to engage patient in RT group sessions 2-3x/week.   Dominic Bryant, LRT,CTRS 04/15/2024 12:51 PM

## 2024-04-15 NOTE — Progress Notes (Signed)
(  Sleep Hours) - 8.25 (Any PRNs that were needed, meds refused, or side effects to meds)- n/a (Any disturbances and when (visitation, over night)- n/a (Concerns raised by the patient)- n/a  (SI/HI/AVH)- denies

## 2024-04-16 ENCOUNTER — Encounter (HOSPITAL_COMMUNITY): Payer: Self-pay

## 2024-04-16 NOTE — Group Note (Signed)
 Recreation Therapy Group Note   Group Topic:Leisure Education  Group Date: 04/16/2024 Start Time: 0933 End Time: 1020 Facilitators: Eliska Hamil-McCall, LRT,CTRS Location: [address] Dayroom   Group Topic: Leisure Education  Goal Area(s) Addresses:  Patient will identify positive leisure activities for use post discharge. Patient will identify at least one positive benefit of participation in leisure activities.  Patient will work effectively work with peer by sharing ideas and contributing to Social worker.  Behavioral Response: Engaged   Intervention: Innovation, Group Presentation   Activity: In pairs, patients were asked to create a community based program with their teammate. Team's were tasked with designing a program, including a Name, Demographic, Age group, Benefits, Hours of operation and What is offered.  Education:  Leisure Scientist, physiological, Special educational needs teacher, Teamwork, Discharge Planning  Education Outcome: Acknowledges education/In group clarification offered/Needs additional education.    Affect/Mood: Appropriate   Participation Level: Engaged   Participation Quality: Independent   Behavior: Appropriate   Speech/Thought Process: Focused   Insight: Good   Judgement: Good   Modes of Intervention: Art   Patient Response to Interventions:  Engaged   Education Outcome:  In group clarification offered    Clinical Observations/Individualized Feedback: Pt was late to group but immediately jumped when the activity was explained to him. Pt created a program for men, women, transgender men and women as well as people who were de transitioning. The program was open from 8-5p and had an on site therapist for those that need it.      Plan: Continue to engage patient in RT group sessions 2-3x/week.   Dominic Bryant, LRT,CTRS 04/16/2024 12:36 PM

## 2024-04-16 NOTE — Plan of Care (Signed)

## 2024-04-16 NOTE — Group Note (Signed)
 Date:  04/16/2024 Time:  9:23 AM  Group Topic/Focus:  Goals Group:   The focus of this group is to help patients establish daily goals to achieve during treatment and discuss how the patient can incorporate goal setting into their daily lives to aide in recovery. Orientation:   The focus of this group is to educate the patient on the purpose and policies of crisis stabilization and provide a format to answer questions about their admission.  The group details unit policies and expectations of patients while admitted.  To create a supportive and motivating environment where members can clarify and set meaningful personal and professional goals, understand how fulfilling foundational needs (as outlined in Maslow's hierarchy) supports their growth, utilize positive affirmations to build self-confidence and resilience, and foster continuous personal development and self-actualization.  Participation Level:  Active  Participation Quality:  Inattentive  Affect:  Appropriate  Cognitive:  Appropriate  Insight: Good  Engagement in Group:  Limited  Modes of Intervention:  Activity and Discussion  Additional Comments:    Dominic Bryant R Dominic Bryant 04/16/2024, 9:23 AM

## 2024-04-16 NOTE — Progress Notes (Signed)
 [facility] MD Progress Note  04/16/2024 12:20 PM Dominic Bryant  MRN:  [id number] Subjective:   Dominic Bryant is a 28 y.o. male  with a past psychiatric history of MDD. Patient initially arrived to [facility] on 10/16 for suicidal ideations with a plan, and admitted to [facility]untary on 10/16 for acute safety concerns and stabilization of acute on chronic psychiatric conditions. PMHx is significant for hypothyroidism, chronic heart murmur.    Case was discussed in the multidisciplinary team. MAR was reviewed and patient was compliant with medications.  Yesterday he received PRN Advil .   Psychiatric Team made the following recommendations yesterday: -Continue Zoloft  100 mg daily for depression -Continue Seroquel  XR 100 mg QHS for insomnia and augmentation   On interview today patient reports he slept good last night.  He reports his appetite is doing good.  He reports no SI, HI, or AVH.  He reports no Paranoia or Ideas of Reference.  He reports no issues with his medications.  Discussed with him that we would continue with the planned increase to 150 mg today with his Zoloft  and he was agreeable.  Discussed with him that if he continued to tolerate the increase we would plan for one further increase prior to discharge and his outpatient provider could make further increases if needed and he was agreeable to this.  He reports no other concerns at present.   Principal Problem: Major depressive disorder, recurrent severe without psychotic features (HCC) Diagnosis: Principal Problem:   Major depressive disorder, recurrent severe without psychotic features (HCC)  Total Time spent with patient:  I personally spent 35 minutes on the unit in direct patient care. The direct patient care time included face-to-face time with the patient, reviewing the patient's chart, communicating with other professionals, and coordinating care.    Past Psychiatric History:  Current Psychiatrist: Dr. Sable Current  Therapist: N/A Previous Psychiatric Diagnoses: MDD  Psychiatric Medications: Current None; pt reports not taking in several months Past Zoloft  300mg  Aripiprazole  300mg  Quetiapine  300mg  XR Psychiatric Hospitalization hx: 5 prior hospitalizations, related to depression and SI  Psychotherapy hx: yes ACT team hx: none Neuromodulation history: none  History of suicide: 5 attempts, most recent 2024 jumped from bridge History of homicide or aggression: aggression history; did not proceed with attack  Past Medical History:  Past Medical History:  Diagnosis Date   Depression    Heart murmur    MDD (major depressive disorder)    Suicide attempt (HCC) 03/11/2023   Jumped off a bridge   Thyroid  disease     Past Surgical History:  Procedure Laterality Date   ESOPHAGOGASTRODUODENOSCOPY (EGD) WITH PROPOFOL  N/A 05/12/2015   Procedure: ESOPHAGOGASTRODUODENOSCOPY (EGD) WITH PROPOFOL ;  Surgeon: Elsie Cree, MD;  Location: [facility];  Service: Endoscopy;  Laterality: N/A;   EUS N/A 05/12/2015   Procedure: UPPER ENDOSCOPIC ULTRASOUND (EUS) RADIAL;  Surgeon: Elsie Cree, MD;  Location: [facility];  Service: Endoscopy;  Laterality: N/A;   Family History:  Family History  Problem Relation Age of Onset   Crohn's disease Mother    Asthma Father    Alcohol abuse Father    Family Psychiatric  History:  Father- EtOH Abuse No Known Diagnosis' or Suicides  Social History:  Social History   Substance and Sexual Activity  Alcohol Use Yes   Comment: social     Social History   Substance and Sexual Activity  Drug Use Yes   Types: Marijuana    Social History   Socioeconomic History  Marital status: Single    Spouse name: Not on file   Number of children: Not on file   Years of education: Not on file   Highest education level: Associate degree: occupational, technical, or vocational program  Occupational History   Not on file  Tobacco Use   Smoking status: Every Day     Current packs/day: 0.50    Average packs/day: 0.5 packs/day for 8.8 years (4.4 ttl pk-yrs)    Types: Cigarettes    Start date: 2017   Smokeless tobacco: Never  Vaping Use   Vaping status: Never Used  Substance and Sexual Activity   Alcohol use: Yes    Comment: social   Drug use: Yes    Types: Marijuana   Sexual activity: Yes    Partners: Female    Birth control/protection: Condom  Other Topics Concern   Not on file  Social History Narrative   Not on file   Social Drivers of Health   Financial Resource Strain: High Risk (04/09/2024)   Overall Financial Resource Strain (CARDIA)    Difficulty of Paying Living Expenses: Hard  Food Insecurity: Food Insecurity Present (04/10/2024)   Hunger Vital Sign    Worried About Running Out of Food in the Last Year: Sometimes true    Ran Out of Food in the Last Year: Sometimes true  Transportation Needs: No Transportation Needs (04/10/2024)   PRAPARE - Administrator, Civil Service (Medical): No    Lack of Transportation (Non-Medical): No  Physical Activity: Sufficiently Active (04/09/2024)   Exercise Vital Sign    Days of Exercise per Week: 5 days    Minutes of Exercise per Session: 30 min  Stress: Stress Concern Present (04/09/2024)   [employer] of Occupational Health - Occupational Stress Questionnaire    Feeling of Stress: Rather much  Social Connections: Socially Isolated (04/09/2024)   Social Connection and Isolation Panel    Frequency of Communication with Friends and Family: Three times a week    Frequency of Social Gatherings with Friends and Family: Twice a week    Attends Religious Services: Never    Database administrator or Organizations: No    Attends Engineer, structural: Not on file    Marital Status: Never married   Additional Social History:                         Sleep: Good Estimated Sleeping Duration (Last 24 Hours): 8.00 hours  Appetite:  Good  Current  Medications: Current Facility-Administered Medications  Medication Dose Route Frequency Provider Last Rate Last Admin   acetaminophen  (TYLENOL ) tablet 650 mg  650 mg Oral Q6H PRN Brent, Amanda C, NP       alum & mag hydroxide-simeth (MAALOX/MYLANTA) 200-200-20 MG/5ML suspension 30 mL  30 mL Oral Q4H PRN Brent, Amanda C, NP       haloperidol  (HALDOL ) tablet 5 mg  5 mg Oral TID PRN Brent, Amanda C, NP       And   diphenhydrAMINE  (BENADRYL ) capsule 50 mg  50 mg Oral TID PRN Brent, Amanda C, NP       haloperidol  lactate (HALDOL ) injection 5 mg  5 mg Intramuscular TID PRN Brent, Amanda C, NP       And   diphenhydrAMINE  (BENADRYL ) injection 50 mg  50 mg Intramuscular TID PRN Thresa Alan BROCKS, NP       And   LORazepam  (ATIVAN ) injection 2 mg  2  mg Intramuscular TID PRN Brent, Amanda C, NP       haloperidol  lactate (HALDOL ) injection 10 mg  10 mg Intramuscular TID PRN Brent, Amanda C, NP       And   diphenhydrAMINE  (BENADRYL ) injection 50 mg  50 mg Intramuscular TID PRN Brent, Amanda C, NP       And   LORazepam  (ATIVAN ) injection 2 mg  2 mg Intramuscular TID PRN Brent, Amanda C, NP       hydrOXYzine  (ATARAX ) tablet 25 mg  25 mg Oral TID PRN Brent, Amanda C, NP   25 mg at 04/10/24 2156   ibuprofen  (ADVIL ) tablet 400 mg  400 mg Oral Q6H PRN Brent, Amanda C, NP   400 mg at 04/15/24 2102   lidocaine  (LIDODERM ) 5 % 1 patch  1 patch Transdermal Daily PRN Towana Leita SAILOR, MD       loratadine (CLARITIN) tablet 10 mg  10 mg Oral QHS PRN Brent, Amanda C, NP       magnesium  hydroxide (MILK OF MAGNESIA) suspension 30 mL  30 mL Oral Daily PRN Brent, Amanda C, NP       nicotine (NICODERM CQ - dosed in mg/24 hours) patch 21 mg  21 mg Transdermal Q0600 Brent, Amanda C, NP       QUEtiapine  (SEROQUEL  XR) 24 hr tablet 100 mg  100 mg Oral QHS Brent, Amanda C, NP   100 mg at 04/15/24 2102   sertraline  (ZOLOFT ) tablet 150 mg  150 mg Oral Daily Emet Rafanan S, DO   150 mg at 04/16/24 9141   traZODone  (DESYREL )  tablet 50 mg  50 mg Oral QHS PRN Brent, Amanda C, NP   50 mg at 04/10/24 2156    Lab Results: No results found for this or any previous visit (from the past 48 hours).  Blood Alcohol level:  Lab Results  Component Value Date   [facility] <15 04/10/2024   ETH <10 03/11/2023    Metabolic Disorder Labs: Lab Results  Component Value Date   HGBA1C 4.0 (L) 04/10/2024   MPG 68.1 04/10/2024   No results found for: PROLACTIN Lab Results  Component Value Date   CHOL 153 04/10/2024   TRIG 75 04/10/2024   HDL 29 (L) 04/10/2024   CHOLHDL 5.3 04/10/2024   VLDL 15 04/10/2024   LDLCALC 109 (H) 04/10/2024   LDLCALC 131 (H) 07/01/2020    Physical Findings: AIMS:  ,  ,  ,  ,  ,  ,   CIWA:    COWS:     Musculoskeletal: Strength & Muscle Tone: within normal limits Gait & Station: normal Patient leans: N/A  Psychiatric Specialty Exam:  Presentation  General Appearance:  Appropriate for Environment; Casual  Eye Contact: Good  Speech: Clear and Coherent; Normal Rate  Speech Volume: Normal  Handedness: Right   Mood and Affect  Mood: Euthymic  Affect: Congruent; Appropriate   Thought Process  Thought Processes: Coherent; Goal Directed  Descriptions of Associations:Intact  Orientation:Full (Time, Place and Person)  Thought Content:Logical; WDL  History of Schizophrenia/Schizoaffective disorder:No  Duration of Psychotic Symptoms:No data recorded Hallucinations:Hallucinations: None  Ideas of Reference:None  Suicidal Thoughts:Suicidal Thoughts: No  Homicidal Thoughts:Homicidal Thoughts: No   Sensorium  Memory: Immediate Good; Recent Good  Judgment: Good  Insight: Good   Executive Functions  Concentration: Good  Attention Span: Good  Recall: Good  Fund of Knowledge: Good  Language: Good   Psychomotor Activity  Psychomotor Activity: Psychomotor Activity: Normal  Assets  Assets: Manufacturing systems engineer; Desire for Improvement;  Housing; Social Support; Resilience   Sleep  Sleep: Sleep: Good    Physical Exam: Physical Exam Review of Systems  Respiratory:  Negative for cough and shortness of breath.   Cardiovascular:  Negative for chest pain.  Gastrointestinal:  Negative for abdominal pain, constipation, diarrhea, nausea and vomiting.  Neurological:  Negative for dizziness, weakness and headaches.  Psychiatric/Behavioral:  Negative for depression, hallucinations and suicidal ideas. The patient is not nervous/anxious.    Blood pressure 116/67, pulse 66, temperature 97.6 F (36.4 C), temperature source Oral, resp. rate 16, height 5' 7 (1.702 m), weight 92.1 kg, SpO2 100%. Body mass index is 31.79 kg/m.   Treatment Plan Summary: Daily contact with patient to assess and evaluate symptoms and progress in treatment and Medication management  Dominic Bryant is a 28 y.o. male  with a past psychiatric history of MDD. Patient initially arrived to [facility] on 10/16 for suicidal ideations with a plan, and admitted to [facility] Voluntary on 10/16 for acute safety concerns and stabilization of acute on chronic psychiatric conditions. PMHx is significant for hypothyroidism, chronic heart murmur.     Delvecchio is tolerating the titration of Zoloft  well.  We will continue with the planned increase today to 150 mg.  We will not make any other changes to his medications at this time.  We will continue to monitor.     Major depressive disorder, recurrent severe without psychotic features (HCC) Cluster B traits    PLAN: Safety and Monitoring:             -- Voluntary admission to inpatient psychiatric unit for safety, stabilization and treatment             -- Daily contact with patient to assess and evaluate symptoms and progress in treatment             -- Patient's case to be discussed in multi-disciplinary team meeting             -- Observation Level : q15 minute checks             -- Vital signs:  q12 hours             --  Precautions: suicide, elopement, and assault   2. Psychiatric Treatment:  -Continue Zoloft  100 mg daily for depression -Continue Seroquel  XR 100 mg QHS for insomnia and augmentation -Continue Agitation Protocol: Haldol /Ativan /Benadryl    -Continue PRN's: Tylenol , Maalox, Atarax , Milk of Magnesia, Trazodone      --  The risks/benefits/side-effects/alternatives to this medication were discussed in detail with the patient and time was given for questions. The patient consents to medication trial.              -- Metabolic profile and EKG monitoring obtained while on an atypical antipsychotic. See #4 below for values.              -- Encouraged patient to participate in unit milieu and in scheduled group therapies              -- Short Term Goals: Ability to identify changes in lifestyle to reduce recurrence of condition will improve, Ability to verbalize feelings will improve, Ability to disclose and discuss suicidal ideas, Ability to demonstrate self-control will improve, Ability to identify and develop effective coping behaviors will improve, Ability to maintain clinical measurements within normal limits will improve, Compliance with prescribed medications will improve, and Ability to identify triggers associated with substance abuse/mental  health issues will improve             -- Long Term Goals: Improvement in symptoms so as ready for discharge   3. Medical Issues Being Addressed:              -- none              4. Routine and other pertinent labs reviewed: EKG monitoring: QTc:   BMI: Body mass index is 31.79 kg/m. Prolactin: Recent Labs  No results found for: PROLACTIN     Lipid Panel: Recent Labs       Lab Results  Component Value Date    CHOL 153 04/10/2024    TRIG 75 04/10/2024    HDL 29 (L) 04/10/2024    CHOLHDL 5.3 04/10/2024    VLDL 15 04/10/2024    LDLCALC 109 (H) 04/10/2024    LDLCALC 131 (H) 07/01/2020      HbgA1c: Last Labs     Hgb A1c MFr Bld (%)   Date Value  04/10/2024 4.0 (L)        TSH: Last Labs     TSH (uIU/mL)  Date Value  04/10/2024 3.345  10/11/2023 5.88 (H)      Labs to order: none   5. Discharge Planning:              -- Social work and case management to assist with discharge planning and identification of hospital follow-up needs prior to discharge             -- Estimated LOS: 3-5 days              -- Discharge Concerns: Need to establish a safety plan; Medication compliance and effectiveness             -- Discharge Goals: Return home with outpatient referrals for mental health follow-up including medication management/psychotherapy none   Marsa GORMAN Rosser, DO 04/16/2024, 12:20 PM

## 2024-04-16 NOTE — Plan of Care (Signed)
   Problem: Education: Goal: Knowledge of [place] [facility] Education information/materials will improve Outcome: Progressing

## 2024-04-16 NOTE — Group Note (Signed)
 Date:  04/16/2024 Time:  5:39 PM  Group Topic/Focus:  Coping With Mental Health Crisis:   The purpose of this group is to help patients identify strategies for coping with mental health crisis.  Group discusses possible causes of crisis and ways to manage them effectively. Developing a Wellness Toolbox:   The focus of this group is to help patients develop a wellness toolbox with skills and strategies to promote recovery upon discharge.    Participation Level:  Active  Participation Quality:  Appropriate  Affect:  Appropriate  Cognitive:  Appropriate  Insight: Appropriate  Engagement in Group:  Engaged  Modes of Intervention:  Discussion  Additional Comments:    Eleanor JAYSON Metro 04/16/2024, 5:39 PM

## 2024-04-16 NOTE — Progress Notes (Signed)
 Spiritual care group facilitated by Chaplain Rockie Sofia, [facility]  Group focused on topic of strength. Group members reflected on what thoughts and feelings emerge when they hear this topic. They then engaged in facilitated dialog around how strength is present in their lives. This dialog focused on representing what strength had been to them in their lives (images and patterns given) and what they saw as helpful in their life now (what they needed / wanted).  Activity drew on narrative framework.  Patient Progress: Dominic Bryant attended group, but was reading a book and did not participate.

## 2024-04-16 NOTE — Progress Notes (Signed)
(  Sleep Hours) -8.5 (Any PRNs that were needed, meds refused, or side effects to meds)- Atarax ,Trazodone , and Ibuprofen  (Any disturbances and when (visitation, over night)-none (Concerns raised by the patient)- none (SI/HI/AVH)-denied

## 2024-04-16 NOTE — [facility] Group Notes (Signed)
 Adult Psychoeducational Group Note  Date:  04/16/2024 Time:  9:17 PM  Group Topic/Focus:  Wrap-Up Group:   The focus of this group is to help patients review their daily goal of treatment and discuss progress on daily workbooks.  Participation Level:  Active  Participation Quality:  Attentive  Affect:  Appropriate  Cognitive:  Alert  Insight: Appropriate  Engagement in Group:  Engaged  Modes of Intervention:  Discussion  Additional Comments:  Patient attended and participated in the Wrap-up group.  Dominic Bryant 04/16/2024, 9:17 PM

## 2024-04-16 NOTE — Progress Notes (Signed)
   04/16/24 2045  Psych Admission Type (Psych Patients Only)  Admission Status Voluntary  Psychosocial Assessment  Patient Complaints Depression  Eye Contact Fair  Facial Expression Flat  Affect Appropriate to circumstance  Speech Logical/coherent  Interaction Assertive  Motor Activity Slow  Appearance/Hygiene Unremarkable  Behavior Characteristics Cooperative  Mood Pleasant  Aggressive Behavior  Effect No apparent injury  Thought Process  Coherency WDL  Content WDL  Delusions None reported or observed  Perception WDL  Hallucination None reported or observed  Judgment Limited  Confusion None  Danger to Self  Current suicidal ideation? Denies

## 2024-04-16 NOTE — BH IP Treatment Plan (Signed)
 Interdisciplinary Treatment and Diagnostic Plan Update  04/16/2024 Time of Session: 12:05 PM - UPDATE Dominic Bryant MRN: [id number]  Principal Diagnosis: Major depressive disorder, recurrent severe without psychotic features (HCC)  Secondary Diagnoses: Principal Problem:   Major depressive disorder, recurrent severe without psychotic features (HCC)   Current Medications:  Current Facility-Administered Medications  Medication Dose Route Frequency Provider Last Rate Last Admin   acetaminophen  (TYLENOL ) tablet 650 mg  650 mg Oral Q6H PRN Brent, Amanda C, NP       alum & mag hydroxide-simeth (MAALOX/MYLANTA) 200-200-20 MG/5ML suspension 30 mL  30 mL Oral Q4H PRN Brent, Amanda C, NP       haloperidol  (HALDOL ) tablet 5 mg  5 mg Oral TID PRN Brent, Amanda C, NP       And   diphenhydrAMINE  (BENADRYL ) capsule 50 mg  50 mg Oral TID PRN Brent, Amanda C, NP       haloperidol  lactate (HALDOL ) injection 5 mg  5 mg Intramuscular TID PRN Brent, Amanda C, NP       And   diphenhydrAMINE  (BENADRYL ) injection 50 mg  50 mg Intramuscular TID PRN Thresa Alan BROCKS, NP       And   LORazepam  (ATIVAN ) injection 2 mg  2 mg Intramuscular TID PRN Brent, Amanda C, NP       haloperidol  lactate (HALDOL ) injection 10 mg  10 mg Intramuscular TID PRN Thresa Alan BROCKS, NP       And   diphenhydrAMINE  (BENADRYL ) injection 50 mg  50 mg Intramuscular TID PRN Thresa Alan BROCKS, NP       And   LORazepam  (ATIVAN ) injection 2 mg  2 mg Intramuscular TID PRN Brent, Amanda C, NP       hydrOXYzine  (ATARAX ) tablet 25 mg  25 mg Oral TID PRN Brent, Amanda C, NP   25 mg at 04/10/24 2156   ibuprofen  (ADVIL ) tablet 400 mg  400 mg Oral Q6H PRN Brent, Amanda C, NP   400 mg at 04/15/24 2102   lidocaine  (LIDODERM ) 5 % 1 patch  1 patch Transdermal Daily PRN Towana Leita SAILOR, MD       loratadine (CLARITIN) tablet 10 mg  10 mg Oral QHS PRN Brent, Amanda C, NP       magnesium  hydroxide (MILK OF MAGNESIA) suspension 30 mL  30 mL Oral Daily PRN  Brent, Amanda C, NP       nicotine (NICODERM CQ - dosed in mg/24 hours) patch 21 mg  21 mg Transdermal Q0600 Brent, Amanda C, NP       QUEtiapine  (SEROQUEL  XR) 24 hr tablet 100 mg  100 mg Oral QHS Brent, Amanda C, NP   100 mg at 04/15/24 2102   sertraline  (ZOLOFT ) tablet 150 mg  150 mg Oral Daily Pashayan, Alexander S, DO   150 mg at 04/16/24 9141   traZODone  (DESYREL ) tablet 50 mg  50 mg Oral QHS PRN Brent, Amanda C, NP   50 mg at 04/10/24 2156   PTA Medications: Medications Prior to Admission  Medication Sig Dispense Refill Last Dose/Taking   celecoxib  (CELEBREX ) 200 MG capsule Take 1 capsule (200 mg total) by mouth daily. (Patient not taking: Reported on 04/10/2024) 30 capsule 0    levocetirizine (XYZAL ) 5 MG tablet Take 1 tablet (5 mg total) by mouth every evening. (Patient not taking: Reported on 10/11/2023) 30 tablet 1    loperamide  (IMODIUM ) 2 MG capsule Take 2 capsules (4 mg total) by mouth as needed for diarrhea  or loose stools. (Patient not taking: Reported on 04/10/2024) 30 capsule 0    QUEtiapine  (SEROQUEL  XR) 300 MG 24 hr tablet Take 1 tablet (300 mg total) by mouth at bedtime. (Patient not taking: Reported on 04/10/2024) 30 tablet 1    sertraline  (ZOLOFT ) 100 MG tablet Take 2 tablets (200 mg total) by mouth daily. 60 tablet 0     Patient Stressors: Other: Had no answers    Patient Strengths: Other: no answer  Treatment Modalities: Medication Management, Group therapy, Case management,  1 to 1 session with clinician, Psychoeducation, Recreational therapy.   Physician Treatment Plan for Primary Diagnosis: Major depressive disorder, recurrent severe without psychotic features (HCC) Long Term Goal(s):     Short Term Goals:    Medication Management: Evaluate patient's response, side effects, and tolerance of medication regimen.  Therapeutic Interventions: 1 to 1 sessions, Unit Group sessions and Medication administration.  Evaluation of Outcomes: Progressing  Physician  Treatment Plan for Secondary Diagnosis: Principal Problem:   Major depressive disorder, recurrent severe without psychotic features (HCC)  Long Term Goal(s):     Short Term Goals:       Medication Management: Evaluate patient's response, side effects, and tolerance of medication regimen.  Therapeutic Interventions: 1 to 1 sessions, Unit Group sessions and Medication administration.  Evaluation of Outcomes: Progressing   RN Treatment Plan for Primary Diagnosis: Major depressive disorder, recurrent severe without psychotic features (HCC) Long Term Goal(s): Knowledge of disease and therapeutic regimen to maintain health will improve  Short Term Goals: Ability to remain free from injury will improve, Ability to verbalize frustration and anger appropriately will improve, Ability to verbalize feelings will improve, and Ability to disclose and discuss suicidal ideas  Medication Management: RN will administer medications as ordered by provider, will assess and evaluate patient's response and provide education to patient for prescribed medication. RN will report any adverse and/or side effects to prescribing provider.  Therapeutic Interventions: 1 on 1 counseling sessions, Psychoeducation, Medication administration, Evaluate responses to treatment, Monitor vital signs and CBGs as ordered, Perform/monitor CIWA, COWS, AIMS and Fall Risk screenings as ordered, Perform wound care treatments as ordered.  Evaluation of Outcomes: Progressing   LCSW Treatment Plan for Primary Diagnosis: Major depressive disorder, recurrent severe without psychotic features (HCC) Long Term Goal(s): Safe transition to appropriate next level of care at discharge, Engage patient in therapeutic group addressing interpersonal concerns.  Short Term Goals: Engage patient in aftercare planning with referrals and resources, Increase ability to appropriately verbalize feelings, Facilitate acceptance of mental health diagnosis and  concerns, and Identify triggers associated with mental health/substance abuse issues  Therapeutic Interventions: Assess for all discharge needs, 1 to 1 time with Social worker, Explore available resources and support systems, Assess for adequacy in community support network, Educate family and significant other(s) on suicide prevention, Complete Psychosocial Assessment, Interpersonal group therapy.  Evaluation of Outcomes: Progressing   Progress in Treatment: Attending groups: Yes Participating in groups: Yes Taking medication as prescribed  Yes Family/Significant other contact made:  Yes, contacted mother Nathanel Platt [phone]  Patient understands diagnosis: Yes. Discussing patient identified problems/goals with staff: Yes. Medical problems stabilized or resolved: Yes. Denies suicidal/homicidal ideation: Yes. Issues/concerns per patient self-inventory: No.   New problem(s) identified:  No   New Short Term/Long Term Goal(s):     medication stabilization, elimination of SI thoughts, development of comprehensive mental wellness plan.      Patient Goals:  I want to get back on my medications.  Discharge Plan or Barriers:  Patient recently admitted. CSW will continue to follow and assess for appropriate referrals and possible discharge planning.      Reason for Continuation of Hospitalization: Depression Medication stabilization Suicidal ideation   Estimated Length of Stay:  4 - 6 days  Last 3 [place] Suicide Severity Risk Score: Flowsheet Row [facility] (Current[facility] Most recent reading at 04/10/2024  4:00 PM ED from 04/10/2024 in [facility] Most recent reading at 04/10/2024 11:21 AM ED from 01/27/2024 in [facility] [place] [facility] Department at [facility] Most recent reading at 01/27/2024 11:18 PM  C-SSRS RISK CATEGORY High Risk High Risk Low Risk    Last PHQ 2/9 Scores:     10/11/2023    1:25 PM 08/30/2023    1:23 PM 07/09/2023    3:25 PM  Depression screen PHQ 2/9  Decreased Interest 1 3 2   Down, Depressed, Hopeless 3 3 3   PHQ - 2 Score 4 6 5   Altered sleeping 3 3 3   Tired, decreased energy 3 3 1   Change in appetite 3 3 2   Feeling bad or failure about yourself  2 3 3   Trouble concentrating 3 3 1   Moving slowly or fidgety/restless 2 3 1   Suicidal thoughts 3 3 3   PHQ-9 Score 23 27 19   Difficult doing work/chores Very difficult  Very difficult    Scribe for Treatment Team: Bron Snellings O Imogean Ciampa, LCSWA 04/16/2024 10:53 AM

## 2024-04-16 NOTE — Group Note (Signed)
 Date:  04/16/2024 Time:  3:57 PM  Group Topic/Focus: Spiritual Wellness   Pt did attend spiritual wellness group  Dominic Bryant 04/16/2024, 3:57 PM

## 2024-04-16 NOTE — Group Note (Signed)
 Date:  04/16/2024 Time:  10:37 AM  Group Topic/Focus: Recreation Therapy   Pt did attend recreation therapy group.   Dominic Bryant 04/16/2024, 10:37 AM

## 2024-04-16 NOTE — [facility] Group Notes (Signed)
 [facility] Group Notes:  (Nursing/MHT/Case Management/Adjunct)  Date:  04/16/2024  Time:  12:13 AM  Type of Therapy:  Psychoeducational Skills  Participation Level:  Minimal  Participation Quality:  Attentive  Affect:  Flat  Cognitive:  Appropriate  Insight:  Appropriate  Engagement in Group:  Limited  Modes of Intervention:  Education  Summary of Progress/Problems: The patient rated his day as an 8 out of a possible 10. He verbalized that he enjoyed pet therapy. He states that he feels not focused but did not go into further detail.   Darian Cansler S 04/16/2024, 12:13 AM

## 2024-04-16 NOTE — Progress Notes (Signed)
   04/16/24 1000  Psych Admission Type (Psych Patients Only)  Admission Status Voluntary  Psychosocial Assessment  Patient Complaints Depression (3/10)  Eye Contact Fair  Facial Expression Fixed smile  Affect Appropriate to circumstance  Speech Logical/coherent  Interaction Assertive  Motor Activity Other (Comment) (WNL)  Appearance/Hygiene Unremarkable  Behavior Characteristics Cooperative  Mood Pleasant (States  I am pretty chill)  Thought Process  Coherency WDL  Content WDL  Delusions None reported or observed  Perception WDL  Hallucination None reported or observed  Judgment Limited  Confusion None  Danger to Self  Current suicidal ideation? Denies

## 2024-04-17 MED ORDER — SERTRALINE HCL 50 MG PO TABS: 150.0000 mg | ORAL_TABLET | Freq: Every day | ORAL | 0 refills | Status: AC

## 2024-04-17 MED ORDER — TRAZODONE HCL 50 MG PO TABS: 50.0000 mg | ORAL_TABLET | Freq: Every evening | ORAL | 0 refills | Status: AC | PRN

## 2024-04-17 MED ORDER — NICOTINE 21 MG/24HR TD PT24: 21.0000 mg | MEDICATED_PATCH | Freq: Every day | TRANSDERMAL | 0 refills | Status: AC

## 2024-04-17 MED ORDER — QUETIAPINE FUMARATE ER 50 MG PO TB24: 100.0000 mg | ORAL_TABLET | Freq: Every day | ORAL | 0 refills | Status: AC

## 2024-04-17 NOTE — [facility] Suicide Risk Assessment (Signed)
 [facility] Discharge Suicide Risk Assessment   Principal Problem: Major depressive disorder, recurrent severe without psychotic features [facility] Discharge Diagnoses: Principal Problem:   Major depressive disorder, recurrent severe without psychotic features (HCC)  During the patient's hospitalization, patient had extensive initial psychiatric evaluation, and follow-up psychiatric evaluations every day.  Psychiatric diagnoses provided upon initial assessment: Major depressive disorder, recurrent severe without psychotic features   Patient's psychiatric medications were adjusted on admission: Restarted on Zoloft .  Restarted on Seroquel .  During the hospitalization, other adjustments were made to the patient's psychiatric medication regimen: His Zoloft  was titrated.  Gradually, patient started adjusting to milieu.   Patient's care was discussed during the interdisciplinary team meeting every day during the hospitalization.  The patient is not having side effects to prescribed psychiatric medication.  The patient reports their target psychiatric symptoms of depression responded well to the psychiatric medications, and the patient reports overall benefit other psychiatric hospitalization. Supportive psychotherapy was provided to the patient. The patient also participated in regular group therapy while admitted.   Labs were reviewed with the patient, and abnormal results were discussed with the patient.  The patient denied having suicidal thoughts more than 48 hours prior to discharge.  Patient denies having homicidal thoughts.  Patient denies having auditory hallucinations.  Patient denies any visual hallucinations.  Patient denies having paranoid thoughts.  The patient is able to verbalize their individual safety plan to this provider.  It is recommended to the patient to continue psychiatric medications as prescribed, after discharge from the hospital.    It is recommended to the patient to follow  up with your outpatient psychiatric provider and PCP.  Discussed with the patient, the impact of alcohol, drugs, tobacco have been there overall psychiatric and medical wellbeing, and total abstinence from substance use was recommended the patient.  Total Time spent with patient: 20 minutes  Musculoskeletal: Strength & Muscle Tone: within normal limits Gait & Station: normal Patient leans: N/A  Psychiatric Specialty Exam  Presentation  General Appearance:  Appropriate for Environment; Casual  Eye Contact: Good  Speech: Clear and Coherent; Normal Rate  Speech Volume: Normal  Handedness: Right   Mood and Affect  Mood: Euthymic  Duration of Depression Symptoms: Greater than two weeks  Affect: Appropriate; Congruent   Thought Process  Thought Processes: Coherent; Goal Directed  Descriptions of Associations:Intact  Orientation:Full (Time, Place and Person)  Thought Content:Logical; WDL  History of Schizophrenia/Schizoaffective disorder:No  Duration of Psychotic Symptoms:No data recorded Hallucinations:Hallucinations: None  Ideas of Reference:None  Suicidal Thoughts:Suicidal Thoughts: No  Homicidal Thoughts:Homicidal Thoughts: No   Sensorium  Memory: Immediate Good; Recent Good  Judgment: Good  Insight: Good   Executive Functions  Concentration: Good  Attention Span: Good  Recall: Good  Fund of Knowledge: Good  Language: Good   Psychomotor Activity  Psychomotor Activity: Psychomotor Activity: Normal   Assets  Assets: Communication Skills; Desire for Improvement; Housing; Social Support; Resilience   Sleep  Sleep: Sleep: Good  Estimated Sleeping Duration (Last 24 Hours): 7.50-8.00 hours  Physical Exam: Physical Exam Vitals and nursing note reviewed.  Constitutional:      General: He is not in acute distress.    Appearance: Normal appearance. He is normal weight. He is not ill-appearing or toxic-appearing.  HENT:      Head: Normocephalic and atraumatic.  Pulmonary:     Effort: Pulmonary effort is normal.  Musculoskeletal:        General: Normal range of motion.  Neurological:     General:  No focal deficit present.     Mental Status: He is alert.    Review of Systems  Respiratory:  Negative for cough and shortness of breath.   Cardiovascular:  Negative for chest pain.  Gastrointestinal:  Negative for abdominal pain, constipation, diarrhea, nausea and vomiting.  Neurological:  Negative for dizziness, weakness and headaches.  Psychiatric/Behavioral:  Negative for depression, hallucinations and suicidal ideas. The patient is not nervous/anxious.    Blood pressure 130/69, pulse 63, temperature (!) 97 F (36.1 C), temperature source Oral, resp. rate 18, height 5' 7 (1.702 m), weight 92.1 kg, SpO2 100%. Body mass index is 31.79 kg/m.  Mental Status Per Nursing Assessment::   On Admission:  Suicidal ideation indicated by patient  Demographic Factors:  Male  Loss Factors: NA  Historical Factors: Prior suicide attempts, Family history of mental illness or substance abuse, and Impulsivity  Risk Reduction Factors:   Living with another person, especially a relative, Positive social support, Positive therapeutic relationship, and Positive coping skills or problem solving skills  Continued Clinical Symptoms:  None  Cognitive Features That Contribute To Risk:  None    Suicide Risk:  Minimal: No identifiable suicidal ideation.  Patients presenting with no risk factors but with morbid ruminations; may be classified as minimal risk based on the severity of the depressive symptoms  However, as he does have a history of prior suicide attempts there is some chronic risk present.   Follow-up Information     [facility] Follow up on 04/22/2024.   Why: You have a hospital follow up appointment, to obtain therapy services on 04/22/25 at 10:00 am.  This will be a Virtual, telehealth appt. Contact  information: [address]  [address] [place] [address] [phone]         Nedra Tinnie LABOR, NP. Go on 04/21/2024.   Specialty: Internal Medicine Why: You have an appointment with your primary care provider for medication management services on 04/21/24 at 3:40 pm, in person.  Please arrive 15 minutes prior and bring any copay due. Contact information: [address] [place] [address] [phone]                 Plan Of Care/Follow-up recommendations:  Activity: as tolerated  Diet: heart healthy  Other: -Follow-up with your outpatient psychiatric provider -instructions on appointment date, time, and address (location) are provided to you in discharge paperwork.  -Take your psychiatric medications as prescribed at discharge - instructions are provided to you in the discharge paperwork  -Follow-up with outpatient primary care doctor and other specialists -for management of chronic medical disease, including: Routine Care  -Testing: Follow-up with outpatient provider for abnormal lab results: None  -Recommend abstinence from alcohol, tobacco, and other illicit drug use at discharge.   -If your psychiatric symptoms recur, worsen, or if you have side effects to your psychiatric medications, call your outpatient psychiatric provider, 911, [phone] or go to the nearest emergency department.  -If suicidal thoughts recur, call your outpatient psychiatric provider, 911, [phone] or go to the nearest emergency department.   Marsa GORMAN Rosser, DO 04/17/2024, 7:19 AM

## 2024-04-17 NOTE — Progress Notes (Signed)
(  Sleep Hours) -8.25 (Any PRNs that were needed, meds refused, or side effects to meds)- Trazodone ,Ibuprofen , and lidocaine  patch (Any disturbances and when (visitation, over night)-none (Concerns raised by the patient)- none (SI/HI/AVH)-Denied
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
## 2024-04-17 NOTE — Progress Notes (Signed)
  [facility] Adult Case Management Discharge Plan :  Will you be returning to the same living situation after discharge:  Yes,  patient will be discharging to [address] [place], [place]. At discharge, do you have transportation home?: Yes,  patient's mother provided transportation at 8:45 am.  Do you have the ability to pay for your medications: Yes,  patient has active health insurance.   Release of information consent forms completed and in the chart;  Patient's signature needed at discharge.  Patient to Follow up at:  Follow-up Information     [facility] Follow up on 04/22/2024.   Why: You have a hospital follow up appointment, to obtain therapy services on 04/22/25 at 10:00 am.  This will be a Virtual, telehealth appt. Contact information: [address]  [address] [place] [address] [phone]         Nedra Tinnie LABOR, NP. Go on 04/21/2024.   Specialty: Internal Medicine Why: You have an appointment with your primary care provider for medication management services on 04/21/24 at 3:40 pm, in person.  Please arrive 15 minutes prior and bring any copay due. Contact information: [address] [place] [address] [phone]                 Next level of care provider has access to [facility] Link:no  Safety Planning and Suicide Prevention discussed: Yes,  completed with mother, Nathanel Platt [phone]      Has patient been referred to the Quitline?: Patient refused referral for treatment  Patient has been referred for addiction treatment: No known substance use disorder.  Louetta Lame, LCSWA 04/17/2024, 9:01 AM

## 2024-04-17 NOTE — Progress Notes (Signed)
 Patient discharged off unit at 0843am. Patient belongings reviewed and acknowledged by patient. AVS and Transition Record reviewed and acknowledged by patient. Safety plan completed by patient, reviewed by nurse with patient and copy provided. Any medications and or prescriptions necessary for discharge addressed and provided to patient. Patient denies SI, plan or intent. Denies HI. Denies AVH. No observed or reported side effects to medication. No observed or reported agitation, aggression, or other acute emotional distress. No reported or observed physical abnormalities or concerns. Patient transportation from facility verified and observed.

## 2024-04-17 NOTE — Plan of Care (Signed)

## 2024-04-21 ENCOUNTER — Encounter: Payer: Self-pay | Admitting: Nurse Practitioner

## 2024-04-21 ENCOUNTER — Telehealth: Payer: Self-pay

## 2024-04-21 ENCOUNTER — Ambulatory Visit: Payer: MEDICAID | Admitting: Nurse Practitioner

## 2024-04-21 VITALS — BP 116/78 | HR 70 | Temp 97.0°F | Ht 67.0 in | Wt 208.6 lb

## 2024-04-21 DIAGNOSIS — Z720 Tobacco use: Secondary | ICD-10-CM

## 2024-04-21 DIAGNOSIS — M545 Low back pain, unspecified: Secondary | ICD-10-CM

## 2024-04-21 DIAGNOSIS — G8929 Other chronic pain: Secondary | ICD-10-CM

## 2024-04-21 DIAGNOSIS — F332 Major depressive disorder, recurrent severe without psychotic features: Secondary | ICD-10-CM

## 2024-04-21 MED ORDER — IBUPROFEN 600 MG PO TABS
600.0000 mg | ORAL_TABLET | Freq: Three times a day (TID) | ORAL | 2 refills | Status: AC | PRN
Start: 2024-04-21 — End: ?

## 2024-04-21 NOTE — Assessment & Plan Note (Signed)
 Continue nicotine patch daily.

## 2024-04-21 NOTE — Assessment & Plan Note (Addendum)
 Chronic, improving. Recently discharged from behavioral health with medication adjustments. Reports mood improvement with no crying spells or mood disturbances. No self-harm thoughts. Keep appointment with psychiatry or therapist tomorrow. Medication reconciliation completed. Continue zoloft  150mg  daily, quetiapine  100mg  daily, and trazodone  50mg  as needed at bedtime. PHQ 9 has improved from a 23 to a 8 and GAD 7 from a 21 to a 4. Follow up in two months or sooner if symptoms worsen. Utilize behavioral health urgent care if needed.

## 2024-04-21 NOTE — Patient Instructions (Signed)
 It was great to see you!  Keep your appointment with psychiatry  Keep taking your medications   The [facility] is open 24/7 and is a walk-in urgent care  [facility] [address] [place], [place] [address] [phone]  Let's follow-up in 2 months, sooner if you have concerns.  If a referral was placed today, you will be contacted for an appointment. Please note that routine referrals can sometimes take up to 3-4 weeks to process. Please call our office if you haven't heard anything after this time frame.  Take care,  Tinnie Harada, NP

## 2024-04-21 NOTE — Assessment & Plan Note (Signed)
 Chronic pain in right thigh, lower back, radiating to shoulders, occasionally impairs movement. Managed with ibuprofen . Refill ibuprofen  prescription 600mg  TID prn.

## 2024-04-21 NOTE — Transitions of Care (Post Inpatient/ED Visit) (Signed)
   04/21/2024  Name: Dominic Bryant MRN: [id number] DOB: 10/10/1995  Today's TOC FU Call Status: Today's TOC FU Call Status:: Successful TOC FU Call Completed TOC FU Call Complete Date: 04/21/24 Patient's Name and Date of Birth confirmed.  Transition Care Management Follow-up Telephone Call Date of Discharge: 04/18/24 Discharge Facility: Other [facility]) Name of Other (Non[facility]) Discharge Facility: [facility] Type of Discharge: Inpatient Admission How have you been since you were released from the hospital?: Better Any questions or concerns?: No  Items Reviewed: Did you receive and understand the discharge instructions provided?: Yes Medications obtained,verified, and reconciled?: Yes (Medications Reviewed) Any new allergies since your discharge?: No Dietary orders reviewed?: NA Do you have support at home?: Yes People in Home [RPT]: parent(s) Name of Support/Comfort Primary Source: patient's mother  Medications Reviewed Today: Medications Reviewed Today     Reviewed by Gladis Sharps, Laymon GRADE, CMA (Certified Medical Assistant) on 04/21/24 at 1531  Med List Status: <None>   Medication Order Taking? Sig Documenting Provider Last Dose Status Informant    Discontinued 06/07/20 1850     Discontinued 06/07/20 1850   nicotine (NICODERM CQ - DOSED IN MG/24 HOURS) 21 mg/24hr patch [id number] Yes Place 1 patch (21 mg total) onto the skin daily at 6 (six) AM. Raliegh, Marsa GORMAN, DO  Active   QUEtiapine  (SEROQUEL  XR) 50 MG TB24 24 hr tablet [id number] Yes Take 2 tablets (100 mg total) by mouth at bedtime. Raliegh Marsa GORMAN, DO  Active   sertraline  (ZOLOFT ) 50 MG tablet [id number] Yes Take 3 tablets (150 mg total) by mouth daily. Raliegh Marsa GORMAN, DO  Active   traZODone  (DESYREL ) 50 MG tablet [id number] Yes Take 1 tablet (50 mg total) by mouth at bedtime as needed for sleep. Raliegh Marsa GORMAN, DO  Active             Home Care and  Equipment/Supplies: Were Home Health Services Ordered?: NA Any new equipment or medical supplies ordered?: NA  Functional Questionnaire: Do you need assistance with bathing/showering or dressing?: No Do you need assistance with meal preparation?: No Do you need assistance with eating?: No Do you have difficulty maintaining continence: No Do you need assistance with getting out of bed/getting out of a chair/moving?: No  Follow up appointments reviewed: PCP Follow-up appointment confirmed?: Yes Date of PCP follow-up appointment?: 04/21/24 Follow-up Provider: Tinnie Harada, NP Specialist Hospital Follow-up appointment confirmed?: Yes Date of Specialist follow-up appointment?: 04/22/24 Do you need transportation to your follow-up appointment?: No Do you understand care options if your condition(s) worsen?: Yes-patient verbalized understanding    SIGNATURE: BMS/CMA

## 2024-04-21 NOTE — Progress Notes (Signed)
 Established Patient Office Visit  Subjective   Patient ID: Dominic Bryant, male    DOB: 05-01-1996  Age: 28 y.o. MRN: [id number]  Chief Complaint  Patient presents with   Hospitalization Follow-up    Discharged from ED on 04/18/24    HPI  Discussed the use of AI scribe software for clinical note transcription with the patient, who gave verbal consent to proceed.  History of Present Illness   Dominic Bryant is a 28 year old male who presents for follow-up after a recent behavioral health admission.  He was discharged from a behavioral health facility after a one-week stay with adjusted medications. He has a therapy appointment scheduled for tomorrow. He reports feeling good since discharge with no recent crying spells, mood disturbances, or thoughts of self-harm or harm to others.  He is taking nicotine patch and psychiatric medications including 150 mg of zoloft  and quetiapine  100mg  at bedtime. He also uses trazodone  50mg  as needed for sleep and reports sleeping well.  He experiences pain in his right thigh, lower back, and occasionally radiating to his shoulder blades and spine, affecting arm and shoulder movement. He uses ibuprofen  for pain management, which helps reduce the pain to a manageable level.     Transition of Care Hospital Follow up.   Hospital/Facility: Behavioral health hospital D/C Physician: Raliegh Marsa GORMAN, DO  D/C Date: 04/17/24  Records Requested: 04/21/24 Records Received: 04/21/24 Records Reviewed: 04/21/24  Diagnoses on Discharge: Major depressive disorder, recurrent severe without psychotic features   Date of interactive Contact within 48 hours of discharge: 04/21/24 Contact was through: direct  Date of 7 day or 14 day face-to-face visit:    within 14 days  Outpatient Encounter Medications as of 04/21/2024  Medication Sig   ibuprofen  (ADVIL ) 600 MG tablet Take 1 tablet (600 mg total) by mouth every 8 (eight) hours as needed.   nicotine  (NICODERM CQ - DOSED IN MG/24 HOURS) 21 mg/24hr patch Place 1 patch (21 mg total) onto the skin daily at 6 (six) AM.   QUEtiapine  (SEROQUEL  XR) 50 MG TB24 24 hr tablet Take 2 tablets (100 mg total) by mouth at bedtime.   sertraline  (ZOLOFT ) 50 MG tablet Take 3 tablets (150 mg total) by mouth daily.   traZODone  (DESYREL ) 50 MG tablet Take 1 tablet (50 mg total) by mouth at bedtime as needed for sleep.   [DISCONTINUED] ARIPiprazole  (ABILIFY ) 5 MG tablet Take 5 mg by mouth daily.   [DISCONTINUED] FLUoxetine  (PROZAC ) 10 MG tablet Take 1 tablet (10 mg total) by mouth daily.   No facility-administered encounter medications on file as of 04/21/2024.    Diagnostic Tests Reviewed/Disposition: Reviewed on chart  Consults: Psychiatry  Discharge Instructions -Continue zoloft  150mg  daily -Continue seroquel  100mg  daily -Continue trazodone  50mg  as needed at bedtime -Keep appointment with Therapist at [facility]  Disease/illness Education: Discussed with patient  Home Health/Community Services Discussions/Referrals: N/A  Establishment or re-establishment of referral orders for community resources: He is aware of 24/7 behavioral urgent care  Discussion with other health care providers: Reviewed notes  Assessment and Support of treatment regimen adherence: discussed with patient  Appointments Coordinated with: N/A  Education for self-management, independent living, and ADLs: Discussed with patient     04/21/2024    3:55 PM 10/11/2023    1:25 PM 08/30/2023    1:23 PM 07/09/2023    3:25 PM 06/14/2023    4:38 PM  Depression screen PHQ 2/9  Decreased Interest 1 1 3 2  1  Down, Depressed, Hopeless 1 3 3 3 3   PHQ - 2 Score 2 4 6 5 4   Altered sleeping 0 3 3 3 3   Tired, decreased energy 0 3 3 1 2   Change in appetite 1 3 3 2 2   Feeling bad or failure about yourself  2 2 3 3 3   Trouble concentrating 1 3 3 1 3   Moving slowly or fidgety/restless 1 2 3 1 2   Suicidal thoughts 1 3 3 3 3   PHQ-9 Score 8 23  27 19 22   Difficult doing work/chores Somewhat difficult Very difficult  Very difficult Very difficult      04/21/2024    3:56 PM 10/11/2023    1:25 PM 08/30/2023    1:24 PM 07/09/2023    3:25 PM  GAD 7 : Generalized Anxiety Score  Nervous, Anxious, on Edge 1 3 3 3   Control/stop worrying 0 3 3 1   Worry too much - different things 1 3 3 2   Trouble relaxing 0 3 3 1   Restless 0 3 3 3   Easily annoyed or irritable 1 3 3 3   Afraid - awful might happen 1 3 3 3   Total GAD 7 Score 4 21 21 16   Anxiety Difficulty Somewhat difficult Extremely difficult  Very difficult      ROS See pertinent positives and negatives per HPI.    Objective:     BP 116/78 (BP Location: Right Arm, Patient Position: Sitting, Cuff Size: Normal)   Pulse 70   Temp (!) 97 F (36.1 C)   Ht 5' 7 (1.702 m)   Wt 208 lb 9.6 oz (94.6 kg)   SpO2 99%   BMI 32.67 kg/m    Physical Exam Vitals and nursing note reviewed.  Constitutional:      Appearance: Normal appearance.  HENT:     Head: Normocephalic.  Eyes:     Conjunctiva/sclera: Conjunctivae normal.  Cardiovascular:     Rate and Rhythm: Normal rate and regular rhythm.     Pulses: Normal pulses.     Heart sounds: Normal heart sounds.  Pulmonary:     Effort: Pulmonary effort is normal.     Breath sounds: Normal breath sounds.  Musculoskeletal:     Cervical back: Normal range of motion.  Skin:    General: Skin is warm.  Neurological:     General: No focal deficit present.     Mental Status: He is alert and oriented to person, place, and time.  Psychiatric:        Mood and Affect: Mood normal.        Behavior: Behavior normal.        Thought Content: Thought content normal.        Judgment: Judgment normal.      Assessment & Plan:   Problem List Items Addressed This Visit       Other   Chronic bilateral low back pain without sciatica   Chronic pain in right thigh, lower back, radiating to shoulders, occasionally impairs movement. Managed  with ibuprofen . Refill ibuprofen  prescription 600mg  TID prn.       Relevant Medications   ibuprofen  (ADVIL ) 600 MG tablet   Tobacco use   Continue nicotine patch daily.       Major depressive disorder, recurrent severe without psychotic features (HCC) - Primary   Chronic, improving. Recently discharged from behavioral health with medication adjustments. Reports mood improvement with no crying spells or mood disturbances. No self-harm thoughts. Keep appointment with psychiatry or therapist  tomorrow. Medication reconciliation completed. Continue zoloft  150mg  daily, quetiapine  100mg  daily, and trazodone  50mg  as needed at bedtime. PHQ 9 has improved from a 23 to a 8 and GAD 7 from a 21 to a 4. Follow up in two months or sooner if symptoms worsen. Utilize behavioral health urgent care if needed.       Return in about 2 months (around 06/21/2024) for Depression.    Tinnie DELENA Harada, NP

## 2024-04-28 ENCOUNTER — Encounter: Payer: Self-pay | Admitting: Radiology

## 2024-07-24 ENCOUNTER — Telehealth: Payer: Self-pay

## 2024-07-24 NOTE — Telephone Encounter (Signed)
 Called to check on patient and get him scheduled for a depression  FU appt with PCP.

## 2024-07-28 ENCOUNTER — Encounter: Payer: Self-pay | Admitting: Nurse Practitioner

## 2024-07-28 ENCOUNTER — Telehealth: Payer: MEDICAID | Admitting: Nurse Practitioner

## 2024-07-28 DIAGNOSIS — G8929 Other chronic pain: Secondary | ICD-10-CM

## 2024-07-28 DIAGNOSIS — M545 Low back pain, unspecified: Secondary | ICD-10-CM

## 2024-07-28 DIAGNOSIS — F332 Major depressive disorder, recurrent severe without psychotic features: Secondary | ICD-10-CM

## 2024-07-28 NOTE — Assessment & Plan Note (Addendum)
 Chronic, improving. No self-harm thoughts. Continue coordination with psychiatry and therapist. Continue zoloft  150mg  daily, quetiapine  100mg  daily, and trazodone  50mg  as needed at bedtime. Follow up in two months or sooner if symptoms worsen. Utilize behavioral health urgent care if needed.

## 2024-10-01 ENCOUNTER — Ambulatory Visit: Payer: MEDICAID | Admitting: Nurse Practitioner
